# Patient Record
Sex: Female | Born: 1935
Health system: Southern US, Community
[De-identification: ages and names within clinical notes are randomized; demographics above are authoritative.]

## PROBLEM LIST (undated history)

## (undated) DIAGNOSIS — M1990 Unspecified osteoarthritis, unspecified site: Secondary | ICD-10-CM

## (undated) DIAGNOSIS — E039 Hypothyroidism, unspecified: Secondary | ICD-10-CM

## (undated) HISTORY — PX: COLONOSCOPY: SHX174

## (undated) HISTORY — PX: MENISCUS REPAIR: SHX5179

---

## 1997-05-15 ENCOUNTER — Other Ambulatory Visit: Admission: RE | Admit: 1997-05-15 | Discharge: 1997-05-15 | Payer: Self-pay | Admitting: Obstetrics and Gynecology

## 1998-04-23 ENCOUNTER — Other Ambulatory Visit: Admission: RE | Admit: 1998-04-23 | Discharge: 1998-04-23 | Payer: Self-pay | Admitting: Obstetrics and Gynecology

## 1998-08-15 ENCOUNTER — Encounter: Payer: Self-pay | Admitting: *Deleted

## 1998-08-16 ENCOUNTER — Ambulatory Visit (HOSPITAL_COMMUNITY): Admission: RE | Admit: 1998-08-16 | Discharge: 1998-08-16 | Payer: Self-pay | Admitting: *Deleted

## 1998-08-16 ENCOUNTER — Encounter: Payer: Self-pay | Admitting: *Deleted

## 1998-08-17 ENCOUNTER — Ambulatory Visit (HOSPITAL_COMMUNITY): Admission: RE | Admit: 1998-08-17 | Discharge: 1998-08-18 | Payer: Self-pay | Admitting: *Deleted

## 1999-06-06 ENCOUNTER — Other Ambulatory Visit: Admission: RE | Admit: 1999-06-06 | Discharge: 1999-06-06 | Payer: Self-pay | Admitting: Obstetrics and Gynecology

## 1999-06-13 ENCOUNTER — Encounter: Payer: Self-pay | Admitting: Obstetrics and Gynecology

## 1999-06-13 ENCOUNTER — Encounter: Admission: RE | Admit: 1999-06-13 | Discharge: 1999-06-13 | Payer: Self-pay | Admitting: Obstetrics and Gynecology

## 1999-08-01 ENCOUNTER — Encounter: Payer: Self-pay | Admitting: *Deleted

## 1999-08-01 ENCOUNTER — Encounter: Admission: RE | Admit: 1999-08-01 | Discharge: 1999-08-01 | Payer: Self-pay | Admitting: *Deleted

## 2000-06-15 ENCOUNTER — Encounter: Payer: Self-pay | Admitting: Internal Medicine

## 2000-06-15 ENCOUNTER — Encounter: Admission: RE | Admit: 2000-06-15 | Discharge: 2000-06-15 | Payer: Self-pay | Admitting: Internal Medicine

## 2000-06-17 ENCOUNTER — Other Ambulatory Visit: Admission: RE | Admit: 2000-06-17 | Discharge: 2000-06-17 | Payer: Self-pay | Admitting: Obstetrics and Gynecology

## 2002-06-21 ENCOUNTER — Encounter: Admission: RE | Admit: 2002-06-21 | Discharge: 2002-06-21 | Payer: Self-pay | Admitting: Internal Medicine

## 2002-06-21 ENCOUNTER — Encounter: Payer: Self-pay | Admitting: Internal Medicine

## 2003-06-22 ENCOUNTER — Encounter: Admission: RE | Admit: 2003-06-22 | Discharge: 2003-06-22 | Payer: Self-pay | Admitting: Internal Medicine

## 2004-07-03 ENCOUNTER — Encounter: Admission: RE | Admit: 2004-07-03 | Discharge: 2004-07-03 | Payer: Self-pay | Admitting: Internal Medicine

## 2004-11-11 ENCOUNTER — Ambulatory Visit (HOSPITAL_COMMUNITY): Admission: RE | Admit: 2004-11-11 | Discharge: 2004-11-11 | Payer: Self-pay | Admitting: Gastroenterology

## 2005-07-04 ENCOUNTER — Encounter: Admission: RE | Admit: 2005-07-04 | Discharge: 2005-07-04 | Payer: Self-pay | Admitting: Internal Medicine

## 2006-07-06 ENCOUNTER — Encounter: Admission: RE | Admit: 2006-07-06 | Discharge: 2006-07-06 | Payer: Self-pay | Admitting: Internal Medicine

## 2007-07-07 ENCOUNTER — Encounter: Admission: RE | Admit: 2007-07-07 | Discharge: 2007-07-07 | Payer: Self-pay | Admitting: Internal Medicine

## 2008-07-10 ENCOUNTER — Encounter: Admission: RE | Admit: 2008-07-10 | Discharge: 2008-07-10 | Payer: Self-pay | Admitting: Internal Medicine

## 2008-07-25 ENCOUNTER — Encounter: Admission: RE | Admit: 2008-07-25 | Discharge: 2008-07-25 | Payer: Self-pay | Admitting: Internal Medicine

## 2008-09-07 ENCOUNTER — Encounter: Admission: RE | Admit: 2008-09-07 | Discharge: 2008-09-07 | Payer: Self-pay | Admitting: Surgery

## 2008-09-11 ENCOUNTER — Encounter (INDEPENDENT_AMBULATORY_CARE_PROVIDER_SITE_OTHER): Payer: Self-pay | Admitting: Surgery

## 2008-09-11 ENCOUNTER — Ambulatory Visit (HOSPITAL_BASED_OUTPATIENT_CLINIC_OR_DEPARTMENT_OTHER): Admission: RE | Admit: 2008-09-11 | Discharge: 2008-09-11 | Payer: Self-pay | Admitting: Surgery

## 2008-09-11 ENCOUNTER — Encounter: Admission: RE | Admit: 2008-09-11 | Discharge: 2008-09-11 | Payer: Self-pay | Admitting: Surgery

## 2009-07-11 ENCOUNTER — Encounter: Admission: RE | Admit: 2009-07-11 | Discharge: 2009-07-11 | Payer: Self-pay | Admitting: Internal Medicine

## 2010-02-25 ENCOUNTER — Encounter: Payer: Self-pay | Admitting: Internal Medicine

## 2010-06-07 ENCOUNTER — Other Ambulatory Visit: Payer: Self-pay | Admitting: Internal Medicine

## 2010-06-07 DIAGNOSIS — Z1231 Encounter for screening mammogram for malignant neoplasm of breast: Secondary | ICD-10-CM

## 2010-06-18 NOTE — Op Note (Signed)
Toni Medina, MUCHA                ACCOUNT NO.:  [id number]   MEDICAL RECORD NO.:  [id number]          PATIENT TYPE:  AMB   LOCATION:  DSC                          FACILITY:  [facility]   PHYSICIAN:  Sandria Bales. Ezzard Standing, M.D.  DATE OF BIRTH:  March 20, 1931   DATE OF PROCEDURE:  DATE OF DISCHARGE:                               OPERATIVE REPORT   PREOPERATIVE DIAGNOSIS:  Atypical ductal hyperplasia of the left breast  at 1 o'clock position.   POSTOPERATIVE DIAGNOSIS:  Atypical ductal hyperplasia of the left breast  at 1 o'clock position.   FINAL PATHOLOGY:  Pending.   PROCEDURE:  Needle localization, left breast biopsy.   SURGEON:  Sandria Bales. Ezzard Standing, M.D.   FIRST ASSISTANT:  None.   ANESTHESIA:  General LMA with 20 mL of 0.25% Marcaine.   COMPLICATIONS:  None.   INDICATIONS FOR PROCEDURE:  Ms. Raybon is a 77 year old white female,  patient of Dr. Theressa Millard who had a recent mammogram, which revealed  microcalcifications in the upper quadrant of her left breast.  She  underwent a biopsy on July 25, 2008, which showed a focal area of  atypical ductal hyperplasia and now comes for wider biopsy of that area.   I discussed with Ms. Massimino the indication and potential complication of  the surgery.  Potential complications of wider biopsy include, but are  not limited to, bleeding, infection, nerve injury, and if this is  malignant she will possibly need more surgery.   OPERATIVE NOTE:  The patient was placed in a supine position, given a  general LMA anesthesia.  Her left breast was prepped with ChloraPrep and  sterilely draped.  A time-out was held to identify the patient and  procedure.   The patient had a wire coming from her left breast at about 1 o'clock  position.  The wire was actually probably 6 or 7 cm from the areola, by  my markings the calcification is probably 2 cm from the areola.  So I  tried to make the incision directly over where I perceived the  calcifications to be.  I  made an incision about 3.5 cm I cut down and  took out a block of tissue probably about the size of a golf ball, may  be 3 cm in diameter.  I then did a specimen mammogram, which confirmed  the wire and a clip on the middle of the specimen.  I did go down to the  chest wall and excised this specimen.   I then closed in layers with a 3-0 Vicryl suture in the subcutaneous  tissues and 5-0 Vicryl suture in the skin and painted with Dermabond.  I  did infiltrate 20 mL of 0.25% Marcaine with local anesthetic.  The  patient tolerated the procedure well and transported to recovery room.   PLAN:  To discharge her home today and return to see me in 7-14 days.      Sandria Bales. Ezzard Standing, M.D.  Electronically Signed     Sandria Bales. Ezzard Standing, M.D.  Electronically Signed    DHN/MEDQ  D:  09/11/2008  T:  09/12/2008  Job:  [id number]   cc:   Theressa Millard, M.D.

## 2010-06-21 NOTE — Op Note (Signed)
NAMEAYDE, RECORD                ACCOUNT NO.:  [id number]   MEDICAL RECORD NO.:  [id number]          PATIENT TYPE:  AMB   LOCATION:  ENDO                         FACILITY:  [facility]   PHYSICIAN:  Bernette Redbird, M.D.   DATE OF BIRTH:  Sep 11, 1931   DATE OF PROCEDURE:  11/11/2004  DATE OF DISCHARGE:                                 OPERATIVE REPORT   PROCEDURE:  Colonoscopy.   INDICATIONS:  A 77 year old female with need for colon cancer screening,  initial exam, no previous colonoscopy, no worrisome risk factors or  symptoms.   FINDINGS:  Severe sigmoid fixation and mild diverticulosis.   DESCRIPTION OF PROCEDURE:  The nature, purpose and risks of the procedure  had been reviewed with the patient who provided written consent. Sedation  was fentanyl 60 mcg and Versed 8 mg IV prior to and during the course of the  exam without arrhythmias or desaturation. The Olympus adjustable tension  pediatric video colonoscope was advanced with severe difficulty through a  very fixated and angulated sigmoid region. With the patient in the supine  position and then back in the left lateral decubitus position, I was  eventually able to get the scope to release and thereafter advancement was  fairly easy around the colon to the cecum which was identified by  visualization of the appendiceal orifice, and then into a normal-appearing  terminal ileum, whereupon pullback was performed. The quality of the prep  was excellent and it was felt that all areas were well seen.   Apart from mild to moderate sigmoid diverticulosis and perhaps some mild  scattered diverticulosis elsewhere, this was a normal examination. No  polyps, cancer, colitis or vascular malformations were seen. Retroflexion in  the rectum was normal. No biopsies were obtained. The patient tolerated the  procedure quite well and there were no apparent complications.   IMPRESSION:  1.  Screening colonoscopy without worrisome findings in a  standard risk      individual (V76.51).  2.  Severe sigmoid fixation with associated diverticulosis.   PLAN:  Consider virtuosity colonoscopy for repeat screening in 10 years.           ______________________________  Bernette Redbird, M.D.     RB/MEDQ  D:  11/11/2004  T:  11/11/2004  Job:  [id number]   cc:   Theressa Millard, M.D.  Fax: [phone]

## 2010-07-15 ENCOUNTER — Ambulatory Visit
Admission: RE | Admit: 2010-07-15 | Discharge: 2010-07-15 | Disposition: A | Discharge status: Home / Self Care | Payer: Medicare Other | Source: Ambulatory Visit | Attending: Internal Medicine | Admitting: Internal Medicine

## 2010-07-15 DIAGNOSIS — Z1231 Encounter for screening mammogram for malignant neoplasm of breast: Secondary | ICD-10-CM

## 2011-06-18 ENCOUNTER — Other Ambulatory Visit: Payer: Self-pay | Admitting: Internal Medicine

## 2011-06-18 DIAGNOSIS — Z1231 Encounter for screening mammogram for malignant neoplasm of breast: Secondary | ICD-10-CM

## 2011-07-16 ENCOUNTER — Ambulatory Visit
Admission: RE | Admit: 2011-07-16 | Discharge: 2011-07-16 | Disposition: A | Discharge status: Home / Self Care | Payer: Medicare Other | Source: Ambulatory Visit | Attending: Internal Medicine | Admitting: Internal Medicine

## 2011-07-16 DIAGNOSIS — Z1231 Encounter for screening mammogram for malignant neoplasm of breast: Secondary | ICD-10-CM

## 2011-09-11 DIAGNOSIS — E039 Hypothyroidism, unspecified: Secondary | ICD-10-CM | POA: Diagnosis not present

## 2011-09-11 DIAGNOSIS — E7800 Pure hypercholesterolemia, unspecified: Secondary | ICD-10-CM | POA: Diagnosis not present

## 2011-09-26 DIAGNOSIS — IMO0002 Reserved for concepts with insufficient information to code with codable children: Secondary | ICD-10-CM | POA: Diagnosis not present

## 2011-09-26 DIAGNOSIS — M1710 Unilateral primary osteoarthritis, unspecified knee: Secondary | ICD-10-CM | POA: Diagnosis not present

## 2011-10-27 DIAGNOSIS — N393 Stress incontinence (female) (male): Secondary | ICD-10-CM | POA: Diagnosis not present

## 2011-10-27 DIAGNOSIS — E7800 Pure hypercholesterolemia, unspecified: Secondary | ICD-10-CM | POA: Diagnosis not present

## 2011-10-27 DIAGNOSIS — Z1331 Encounter for screening for depression: Secondary | ICD-10-CM | POA: Diagnosis not present

## 2011-10-27 DIAGNOSIS — E039 Hypothyroidism, unspecified: Secondary | ICD-10-CM | POA: Diagnosis not present

## 2011-10-27 DIAGNOSIS — Z0000 Encounter for general adult medical examination without abnormal findings: Secondary | ICD-10-CM | POA: Diagnosis not present

## 2011-11-26 DIAGNOSIS — R3915 Urgency of urination: Secondary | ICD-10-CM | POA: Diagnosis not present

## 2011-11-26 DIAGNOSIS — N3941 Urge incontinence: Secondary | ICD-10-CM | POA: Diagnosis not present

## 2011-11-26 DIAGNOSIS — R350 Frequency of micturition: Secondary | ICD-10-CM | POA: Diagnosis not present

## 2011-11-27 DIAGNOSIS — H04129 Dry eye syndrome of unspecified lacrimal gland: Secondary | ICD-10-CM | POA: Diagnosis not present

## 2011-11-27 DIAGNOSIS — H43819 Vitreous degeneration, unspecified eye: Secondary | ICD-10-CM | POA: Diagnosis not present

## 2011-11-27 DIAGNOSIS — Z961 Presence of intraocular lens: Secondary | ICD-10-CM | POA: Diagnosis not present

## 2011-11-27 DIAGNOSIS — H52209 Unspecified astigmatism, unspecified eye: Secondary | ICD-10-CM | POA: Diagnosis not present

## 2011-12-27 DIAGNOSIS — Z23 Encounter for immunization: Secondary | ICD-10-CM | POA: Diagnosis not present

## 2012-02-04 HISTORY — PX: BREAST EXCISIONAL BIOPSY: SUR124

## 2012-06-07 ENCOUNTER — Other Ambulatory Visit: Payer: Self-pay

## 2012-06-07 DIAGNOSIS — Z1231 Encounter for screening mammogram for malignant neoplasm of breast: Secondary | ICD-10-CM

## 2012-06-17 DIAGNOSIS — J069 Acute upper respiratory infection, unspecified: Secondary | ICD-10-CM | POA: Diagnosis not present

## 2012-07-16 ENCOUNTER — Ambulatory Visit: Payer: Medicare Other

## 2012-07-19 ENCOUNTER — Ambulatory Visit
Admission: RE | Admit: 2012-07-19 | Discharge: 2012-07-19 | Disposition: A | Discharge status: Home / Self Care | Payer: Medicare Other | Source: Ambulatory Visit

## 2012-07-19 DIAGNOSIS — Z1231 Encounter for screening mammogram for malignant neoplasm of breast: Secondary | ICD-10-CM

## 2012-10-26 DIAGNOSIS — E7800 Pure hypercholesterolemia, unspecified: Secondary | ICD-10-CM | POA: Diagnosis not present

## 2012-10-26 DIAGNOSIS — Z0000 Encounter for general adult medical examination without abnormal findings: Secondary | ICD-10-CM | POA: Diagnosis not present

## 2012-10-26 DIAGNOSIS — E039 Hypothyroidism, unspecified: Secondary | ICD-10-CM | POA: Diagnosis not present

## 2012-10-26 DIAGNOSIS — L989 Disorder of the skin and subcutaneous tissue, unspecified: Secondary | ICD-10-CM | POA: Diagnosis not present

## 2012-10-26 DIAGNOSIS — Z1331 Encounter for screening for depression: Secondary | ICD-10-CM | POA: Diagnosis not present

## 2012-11-23 DIAGNOSIS — L821 Other seborrheic keratosis: Secondary | ICD-10-CM | POA: Diagnosis not present

## 2012-11-23 DIAGNOSIS — L84 Corns and callosities: Secondary | ICD-10-CM | POA: Diagnosis not present

## 2012-11-24 DIAGNOSIS — Z23 Encounter for immunization: Secondary | ICD-10-CM | POA: Diagnosis not present

## 2012-12-22 DIAGNOSIS — Z961 Presence of intraocular lens: Secondary | ICD-10-CM | POA: Diagnosis not present

## 2012-12-22 DIAGNOSIS — H43819 Vitreous degeneration, unspecified eye: Secondary | ICD-10-CM | POA: Diagnosis not present

## 2012-12-22 DIAGNOSIS — H52209 Unspecified astigmatism, unspecified eye: Secondary | ICD-10-CM | POA: Diagnosis not present

## 2013-07-07 ENCOUNTER — Other Ambulatory Visit: Payer: Self-pay

## 2013-07-07 DIAGNOSIS — Z1231 Encounter for screening mammogram for malignant neoplasm of breast: Secondary | ICD-10-CM

## 2013-07-20 ENCOUNTER — Ambulatory Visit
Admission: RE | Admit: 2013-07-20 | Discharge: 2013-07-20 | Disposition: A | Discharge status: Home / Self Care | Payer: Medicare Other | Source: Ambulatory Visit

## 2013-07-20 DIAGNOSIS — Z1231 Encounter for screening mammogram for malignant neoplasm of breast: Secondary | ICD-10-CM

## 2013-11-07 DIAGNOSIS — E039 Hypothyroidism, unspecified: Secondary | ICD-10-CM | POA: Diagnosis not present

## 2013-11-07 DIAGNOSIS — Z0000 Encounter for general adult medical examination without abnormal findings: Secondary | ICD-10-CM | POA: Diagnosis not present

## 2013-11-07 DIAGNOSIS — E780 Pure hypercholesterolemia: Secondary | ICD-10-CM | POA: Diagnosis not present

## 2013-11-07 DIAGNOSIS — R200 Anesthesia of skin: Secondary | ICD-10-CM | POA: Diagnosis not present

## 2013-11-16 DIAGNOSIS — Z23 Encounter for immunization: Secondary | ICD-10-CM | POA: Diagnosis not present

## 2013-12-15 DIAGNOSIS — Z961 Presence of intraocular lens: Secondary | ICD-10-CM | POA: Diagnosis not present

## 2013-12-15 DIAGNOSIS — H52203 Unspecified astigmatism, bilateral: Secondary | ICD-10-CM | POA: Diagnosis not present

## 2014-06-22 ENCOUNTER — Other Ambulatory Visit: Payer: Self-pay

## 2014-06-22 DIAGNOSIS — Z1231 Encounter for screening mammogram for malignant neoplasm of breast: Secondary | ICD-10-CM

## 2014-06-30 ENCOUNTER — Other Ambulatory Visit: Payer: Self-pay | Admitting: Internal Medicine

## 2014-06-30 DIAGNOSIS — E2839 Other primary ovarian failure: Secondary | ICD-10-CM

## 2014-07-24 ENCOUNTER — Ambulatory Visit
Admission: RE | Admit: 2014-07-24 | Discharge: 2014-07-24 | Disposition: A | Discharge status: Home / Self Care | Payer: Medicare Other | Source: Ambulatory Visit

## 2014-07-24 ENCOUNTER — Ambulatory Visit
Admission: RE | Admit: 2014-07-24 | Discharge: 2014-07-24 | Disposition: A | Discharge status: Home / Self Care | Payer: Medicare Other | Source: Ambulatory Visit | Attending: Internal Medicine | Admitting: Internal Medicine

## 2014-07-24 DIAGNOSIS — Z1231 Encounter for screening mammogram for malignant neoplasm of breast: Secondary | ICD-10-CM | POA: Diagnosis not present

## 2014-07-24 DIAGNOSIS — Z13820 Encounter for screening for osteoporosis: Secondary | ICD-10-CM | POA: Diagnosis not present

## 2014-07-24 DIAGNOSIS — E2839 Other primary ovarian failure: Secondary | ICD-10-CM

## 2014-07-24 DIAGNOSIS — Z780 Asymptomatic menopausal state: Secondary | ICD-10-CM | POA: Diagnosis not present

## 2014-09-06 DIAGNOSIS — H01004 Unspecified blepharitis left upper eyelid: Secondary | ICD-10-CM | POA: Diagnosis not present

## 2014-09-06 DIAGNOSIS — H1032 Unspecified acute conjunctivitis, left eye: Secondary | ICD-10-CM | POA: Diagnosis not present

## 2014-09-06 DIAGNOSIS — H01005 Unspecified blepharitis left lower eyelid: Secondary | ICD-10-CM | POA: Diagnosis not present

## 2014-11-09 DIAGNOSIS — Z0000 Encounter for general adult medical examination without abnormal findings: Secondary | ICD-10-CM | POA: Diagnosis not present

## 2014-11-09 DIAGNOSIS — E7800 Pure hypercholesterolemia, unspecified: Secondary | ICD-10-CM | POA: Diagnosis not present

## 2014-11-09 DIAGNOSIS — M545 Low back pain: Secondary | ICD-10-CM | POA: Diagnosis not present

## 2014-11-09 DIAGNOSIS — E039 Hypothyroidism, unspecified: Secondary | ICD-10-CM | POA: Diagnosis not present

## 2014-11-09 DIAGNOSIS — L659 Nonscarring hair loss, unspecified: Secondary | ICD-10-CM | POA: Diagnosis not present

## 2014-12-18 DIAGNOSIS — H43813 Vitreous degeneration, bilateral: Secondary | ICD-10-CM | POA: Diagnosis not present

## 2014-12-18 DIAGNOSIS — Z961 Presence of intraocular lens: Secondary | ICD-10-CM | POA: Diagnosis not present

## 2014-12-18 DIAGNOSIS — H52203 Unspecified astigmatism, bilateral: Secondary | ICD-10-CM | POA: Diagnosis not present

## 2014-12-20 DIAGNOSIS — Z23 Encounter for immunization: Secondary | ICD-10-CM | POA: Diagnosis not present

## 2015-02-23 DIAGNOSIS — L718 Other rosacea: Secondary | ICD-10-CM | POA: Diagnosis not present

## 2015-02-23 DIAGNOSIS — L0109 Other impetigo: Secondary | ICD-10-CM | POA: Diagnosis not present

## 2015-05-15 DIAGNOSIS — Z23 Encounter for immunization: Secondary | ICD-10-CM | POA: Diagnosis not present

## 2015-06-26 ENCOUNTER — Other Ambulatory Visit: Payer: Self-pay | Admitting: Gastroenterology

## 2015-06-26 DIAGNOSIS — D120 Benign neoplasm of cecum: Secondary | ICD-10-CM | POA: Diagnosis not present

## 2015-06-26 DIAGNOSIS — D122 Benign neoplasm of ascending colon: Secondary | ICD-10-CM | POA: Diagnosis not present

## 2015-06-26 DIAGNOSIS — Z1211 Encounter for screening for malignant neoplasm of colon: Secondary | ICD-10-CM | POA: Diagnosis not present

## 2015-06-26 DIAGNOSIS — D126 Benign neoplasm of colon, unspecified: Secondary | ICD-10-CM | POA: Diagnosis not present

## 2015-07-04 ENCOUNTER — Other Ambulatory Visit: Payer: Self-pay | Admitting: Internal Medicine

## 2015-07-04 DIAGNOSIS — Z1231 Encounter for screening mammogram for malignant neoplasm of breast: Secondary | ICD-10-CM

## 2015-07-25 ENCOUNTER — Ambulatory Visit
Admission: RE | Admit: 2015-07-25 | Discharge: 2015-07-25 | Disposition: A | Discharge status: Home / Self Care | Payer: Medicare Other | Source: Ambulatory Visit | Attending: Internal Medicine | Admitting: Internal Medicine

## 2015-07-25 DIAGNOSIS — Z1231 Encounter for screening mammogram for malignant neoplasm of breast: Secondary | ICD-10-CM

## 2015-10-09 ENCOUNTER — Other Ambulatory Visit: Payer: Self-pay | Admitting: Internal Medicine

## 2015-10-09 ENCOUNTER — Ambulatory Visit
Admission: RE | Admit: 2015-10-09 | Discharge: 2015-10-09 | Disposition: A | Discharge status: Home / Self Care | Payer: Medicare Other | Source: Ambulatory Visit | Attending: Internal Medicine | Admitting: Internal Medicine

## 2015-10-09 DIAGNOSIS — M545 Low back pain: Secondary | ICD-10-CM

## 2015-10-09 DIAGNOSIS — M5136 Other intervertebral disc degeneration, lumbar region: Secondary | ICD-10-CM | POA: Diagnosis not present

## 2015-11-13 DIAGNOSIS — Z23 Encounter for immunization: Secondary | ICD-10-CM | POA: Diagnosis not present

## 2015-11-22 ENCOUNTER — Other Ambulatory Visit: Payer: Self-pay | Admitting: Internal Medicine

## 2015-11-22 DIAGNOSIS — E7800 Pure hypercholesterolemia, unspecified: Secondary | ICD-10-CM | POA: Diagnosis not present

## 2015-11-22 DIAGNOSIS — M545 Low back pain, unspecified: Secondary | ICD-10-CM

## 2015-11-22 DIAGNOSIS — E039 Hypothyroidism, unspecified: Secondary | ICD-10-CM | POA: Diagnosis not present

## 2015-11-22 DIAGNOSIS — M5126 Other intervertebral disc displacement, lumbar region: Secondary | ICD-10-CM | POA: Diagnosis not present

## 2015-11-22 DIAGNOSIS — M5127 Other intervertebral disc displacement, lumbosacral region: Secondary | ICD-10-CM | POA: Diagnosis not present

## 2015-11-22 DIAGNOSIS — M47816 Spondylosis without myelopathy or radiculopathy, lumbar region: Secondary | ICD-10-CM | POA: Diagnosis not present

## 2015-11-22 DIAGNOSIS — Z1389 Encounter for screening for other disorder: Secondary | ICD-10-CM | POA: Diagnosis not present

## 2015-11-22 DIAGNOSIS — G548 Other nerve root and plexus disorders: Secondary | ICD-10-CM | POA: Diagnosis not present

## 2015-11-22 DIAGNOSIS — M47817 Spondylosis without myelopathy or radiculopathy, lumbosacral region: Secondary | ICD-10-CM | POA: Diagnosis not present

## 2015-11-22 DIAGNOSIS — Z0000 Encounter for general adult medical examination without abnormal findings: Secondary | ICD-10-CM | POA: Diagnosis not present

## 2015-12-03 ENCOUNTER — Other Ambulatory Visit: Payer: Medicare Other

## 2015-12-10 DIAGNOSIS — M545 Low back pain: Secondary | ICD-10-CM | POA: Diagnosis not present

## 2015-12-11 ENCOUNTER — Other Ambulatory Visit: Payer: Medicare Other

## 2015-12-19 DIAGNOSIS — Z961 Presence of intraocular lens: Secondary | ICD-10-CM | POA: Diagnosis not present

## 2015-12-19 DIAGNOSIS — H43813 Vitreous degeneration, bilateral: Secondary | ICD-10-CM | POA: Diagnosis not present

## 2015-12-19 DIAGNOSIS — H52203 Unspecified astigmatism, bilateral: Secondary | ICD-10-CM | POA: Diagnosis not present

## 2015-12-31 DIAGNOSIS — M5136 Other intervertebral disc degeneration, lumbar region: Secondary | ICD-10-CM | POA: Diagnosis not present

## 2016-05-14 DIAGNOSIS — G8929 Other chronic pain: Secondary | ICD-10-CM | POA: Diagnosis not present

## 2016-05-14 DIAGNOSIS — M545 Low back pain: Secondary | ICD-10-CM | POA: Diagnosis not present

## 2016-05-14 DIAGNOSIS — R030 Elevated blood-pressure reading, without diagnosis of hypertension: Secondary | ICD-10-CM | POA: Diagnosis not present

## 2016-06-19 DIAGNOSIS — M5136 Other intervertebral disc degeneration, lumbar region: Secondary | ICD-10-CM | POA: Diagnosis not present

## 2016-06-24 DIAGNOSIS — M5136 Other intervertebral disc degeneration, lumbar region: Secondary | ICD-10-CM | POA: Diagnosis not present

## 2016-06-26 DIAGNOSIS — M5136 Other intervertebral disc degeneration, lumbar region: Secondary | ICD-10-CM | POA: Diagnosis not present

## 2016-07-01 DIAGNOSIS — M5136 Other intervertebral disc degeneration, lumbar region: Secondary | ICD-10-CM | POA: Diagnosis not present

## 2016-07-03 DIAGNOSIS — M5136 Other intervertebral disc degeneration, lumbar region: Secondary | ICD-10-CM | POA: Diagnosis not present

## 2016-07-07 ENCOUNTER — Other Ambulatory Visit: Payer: Self-pay | Admitting: Internal Medicine

## 2016-07-07 DIAGNOSIS — Z1231 Encounter for screening mammogram for malignant neoplasm of breast: Secondary | ICD-10-CM

## 2016-07-08 DIAGNOSIS — M5136 Other intervertebral disc degeneration, lumbar region: Secondary | ICD-10-CM | POA: Diagnosis not present

## 2016-07-11 DIAGNOSIS — M5136 Other intervertebral disc degeneration, lumbar region: Secondary | ICD-10-CM | POA: Diagnosis not present

## 2016-07-14 DIAGNOSIS — M5136 Other intervertebral disc degeneration, lumbar region: Secondary | ICD-10-CM | POA: Diagnosis not present

## 2016-07-18 DIAGNOSIS — M5136 Other intervertebral disc degeneration, lumbar region: Secondary | ICD-10-CM | POA: Diagnosis not present

## 2016-07-21 DIAGNOSIS — M5136 Other intervertebral disc degeneration, lumbar region: Secondary | ICD-10-CM | POA: Diagnosis not present

## 2016-07-25 ENCOUNTER — Ambulatory Visit
Admission: RE | Admit: 2016-07-25 | Discharge: 2016-07-25 | Disposition: A | Discharge status: Home / Self Care | Payer: Medicare Other | Source: Ambulatory Visit | Attending: Internal Medicine | Admitting: Internal Medicine

## 2016-07-25 DIAGNOSIS — Z1231 Encounter for screening mammogram for malignant neoplasm of breast: Secondary | ICD-10-CM

## 2016-07-29 DIAGNOSIS — M5136 Other intervertebral disc degeneration, lumbar region: Secondary | ICD-10-CM | POA: Diagnosis not present

## 2016-08-01 DIAGNOSIS — M5136 Other intervertebral disc degeneration, lumbar region: Secondary | ICD-10-CM | POA: Diagnosis not present

## 2016-08-05 DIAGNOSIS — M5136 Other intervertebral disc degeneration, lumbar region: Secondary | ICD-10-CM | POA: Diagnosis not present

## 2016-08-12 DIAGNOSIS — M5136 Other intervertebral disc degeneration, lumbar region: Secondary | ICD-10-CM | POA: Diagnosis not present

## 2016-08-19 DIAGNOSIS — M5136 Other intervertebral disc degeneration, lumbar region: Secondary | ICD-10-CM | POA: Diagnosis not present

## 2016-10-23 DIAGNOSIS — R350 Frequency of micturition: Secondary | ICD-10-CM | POA: Diagnosis not present

## 2016-11-24 DIAGNOSIS — Z1389 Encounter for screening for other disorder: Secondary | ICD-10-CM | POA: Diagnosis not present

## 2016-11-24 DIAGNOSIS — M545 Low back pain: Secondary | ICD-10-CM | POA: Diagnosis not present

## 2016-11-24 DIAGNOSIS — L659 Nonscarring hair loss, unspecified: Secondary | ICD-10-CM | POA: Diagnosis not present

## 2016-11-24 DIAGNOSIS — E7800 Pure hypercholesterolemia, unspecified: Secondary | ICD-10-CM | POA: Diagnosis not present

## 2016-11-24 DIAGNOSIS — M25511 Pain in right shoulder: Secondary | ICD-10-CM | POA: Diagnosis not present

## 2016-11-24 DIAGNOSIS — E039 Hypothyroidism, unspecified: Secondary | ICD-10-CM | POA: Diagnosis not present

## 2016-11-24 DIAGNOSIS — Z0000 Encounter for general adult medical examination without abnormal findings: Secondary | ICD-10-CM | POA: Diagnosis not present

## 2016-12-09 DIAGNOSIS — Z23 Encounter for immunization: Secondary | ICD-10-CM | POA: Diagnosis not present

## 2016-12-10 DIAGNOSIS — M5136 Other intervertebral disc degeneration, lumbar region: Secondary | ICD-10-CM | POA: Diagnosis not present

## 2016-12-16 DIAGNOSIS — M5136 Other intervertebral disc degeneration, lumbar region: Secondary | ICD-10-CM | POA: Diagnosis not present

## 2016-12-18 DIAGNOSIS — Z961 Presence of intraocular lens: Secondary | ICD-10-CM | POA: Diagnosis not present

## 2016-12-18 DIAGNOSIS — H52203 Unspecified astigmatism, bilateral: Secondary | ICD-10-CM | POA: Diagnosis not present

## 2016-12-18 DIAGNOSIS — H43813 Vitreous degeneration, bilateral: Secondary | ICD-10-CM | POA: Diagnosis not present

## 2016-12-19 DIAGNOSIS — M5136 Other intervertebral disc degeneration, lumbar region: Secondary | ICD-10-CM | POA: Diagnosis not present

## 2016-12-22 DIAGNOSIS — R350 Frequency of micturition: Secondary | ICD-10-CM | POA: Diagnosis not present

## 2016-12-22 DIAGNOSIS — M5136 Other intervertebral disc degeneration, lumbar region: Secondary | ICD-10-CM | POA: Diagnosis not present

## 2017-01-02 DIAGNOSIS — M5136 Other intervertebral disc degeneration, lumbar region: Secondary | ICD-10-CM | POA: Diagnosis not present

## 2017-01-06 DIAGNOSIS — M5136 Other intervertebral disc degeneration, lumbar region: Secondary | ICD-10-CM | POA: Diagnosis not present

## 2017-01-09 DIAGNOSIS — M5136 Other intervertebral disc degeneration, lumbar region: Secondary | ICD-10-CM | POA: Diagnosis not present

## 2017-03-20 ENCOUNTER — Other Ambulatory Visit: Payer: Self-pay | Admitting: Internal Medicine

## 2017-03-20 DIAGNOSIS — R030 Elevated blood-pressure reading, without diagnosis of hypertension: Secondary | ICD-10-CM | POA: Diagnosis not present

## 2017-03-20 DIAGNOSIS — Z0000 Encounter for general adult medical examination without abnormal findings: Secondary | ICD-10-CM | POA: Diagnosis not present

## 2017-03-20 DIAGNOSIS — E7800 Pure hypercholesterolemia, unspecified: Secondary | ICD-10-CM | POA: Diagnosis not present

## 2017-03-20 DIAGNOSIS — M545 Low back pain: Secondary | ICD-10-CM | POA: Diagnosis not present

## 2017-03-20 DIAGNOSIS — E2839 Other primary ovarian failure: Secondary | ICD-10-CM | POA: Diagnosis not present

## 2017-03-20 DIAGNOSIS — R42 Dizziness and giddiness: Secondary | ICD-10-CM | POA: Diagnosis not present

## 2017-03-20 DIAGNOSIS — E039 Hypothyroidism, unspecified: Secondary | ICD-10-CM | POA: Diagnosis not present

## 2017-03-20 DIAGNOSIS — Z23 Encounter for immunization: Secondary | ICD-10-CM | POA: Diagnosis not present

## 2017-04-01 ENCOUNTER — Ambulatory Visit
Admission: RE | Admit: 2017-04-01 | Discharge: 2017-04-01 | Disposition: A | Discharge status: Home / Self Care | Payer: Medicare Other | Source: Ambulatory Visit | Attending: Internal Medicine | Admitting: Internal Medicine

## 2017-04-01 DIAGNOSIS — Z780 Asymptomatic menopausal state: Secondary | ICD-10-CM | POA: Diagnosis not present

## 2017-04-01 DIAGNOSIS — M85851 Other specified disorders of bone density and structure, right thigh: Secondary | ICD-10-CM | POA: Diagnosis not present

## 2017-04-01 DIAGNOSIS — E2839 Other primary ovarian failure: Secondary | ICD-10-CM

## 2017-07-31 ENCOUNTER — Other Ambulatory Visit: Payer: Self-pay | Admitting: Internal Medicine

## 2017-07-31 DIAGNOSIS — Z1231 Encounter for screening mammogram for malignant neoplasm of breast: Secondary | ICD-10-CM

## 2017-08-20 ENCOUNTER — Inpatient Hospital Stay: Admission: RE | Admit: 2017-08-20 | Payer: Medicare Other | Source: Ambulatory Visit

## 2017-08-26 ENCOUNTER — Ambulatory Visit
Admission: RE | Admit: 2017-08-26 | Discharge: 2017-08-26 | Disposition: A | Discharge status: Home / Self Care | Payer: Medicare Other | Source: Ambulatory Visit | Attending: Internal Medicine | Admitting: Internal Medicine

## 2017-08-26 DIAGNOSIS — Z1231 Encounter for screening mammogram for malignant neoplasm of breast: Secondary | ICD-10-CM | POA: Diagnosis not present

## 2017-09-06 IMAGING — MG 2D DIGITAL SCREENING BILATERAL MAMMOGRAM WITH CAD AND ADJUNCT TO
9 of 12 series · 9 of 28 positions shown · non-contrast
Comparison: Previous exam(s).

CLINICAL DATA: Screening.

EXAM:
2D DIGITAL SCREENING BILATERAL MAMMOGRAM WITH CAD AND ADJUNCT TOMO

[R MLO synth-2D]
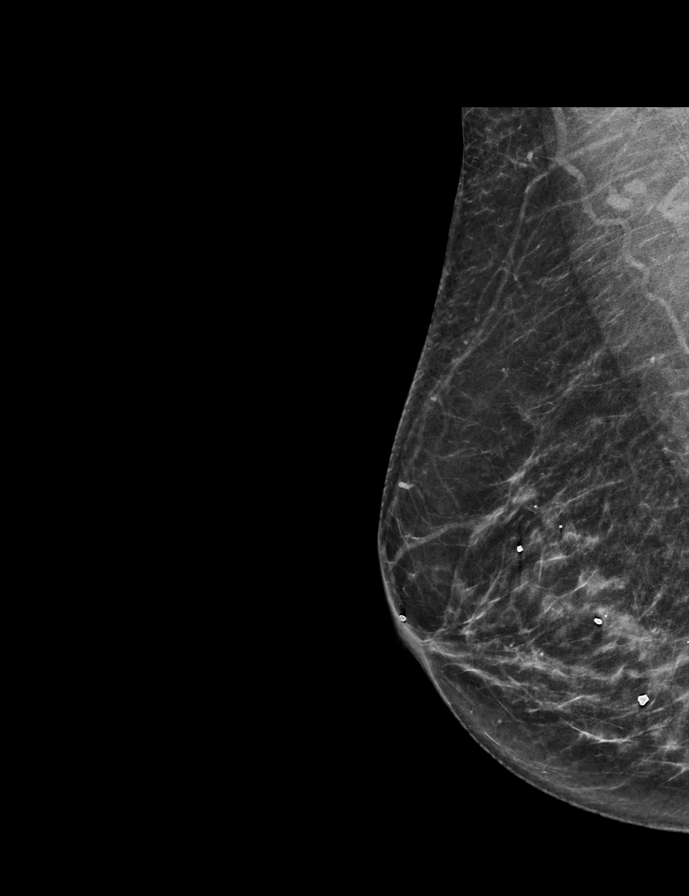

[L MLO]
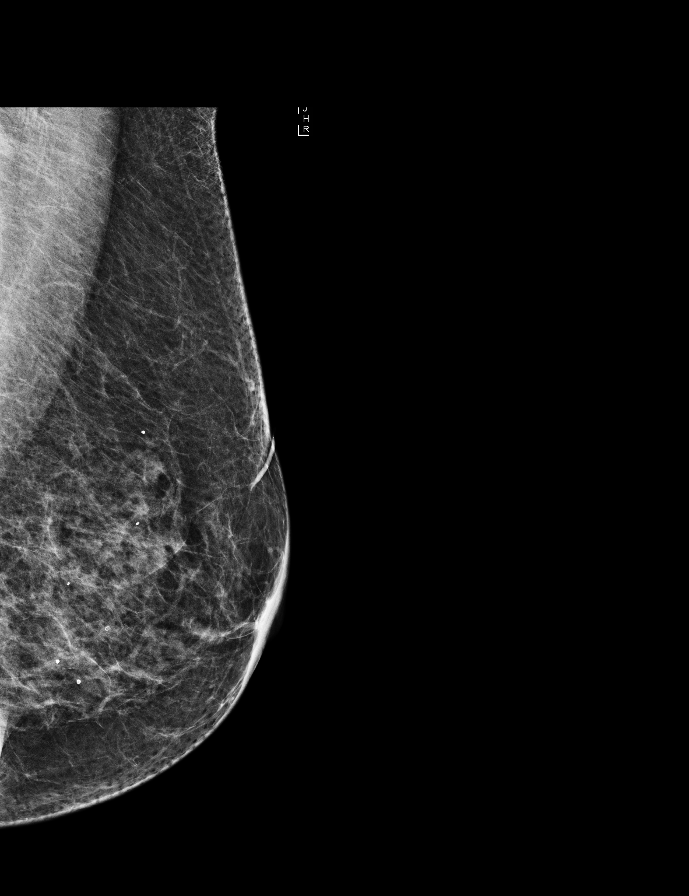

[L CC synth-2D]
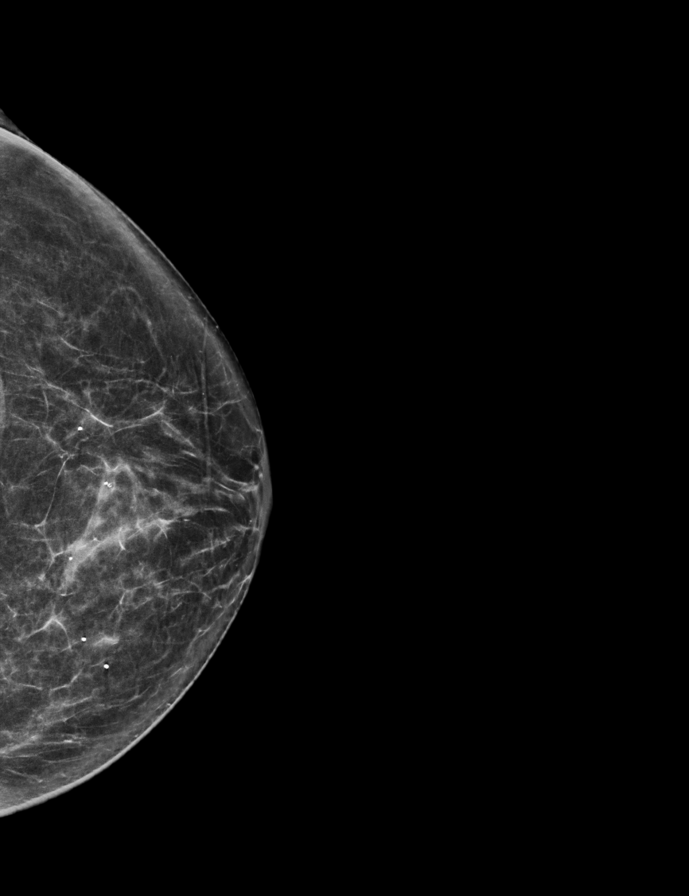

[L CC]
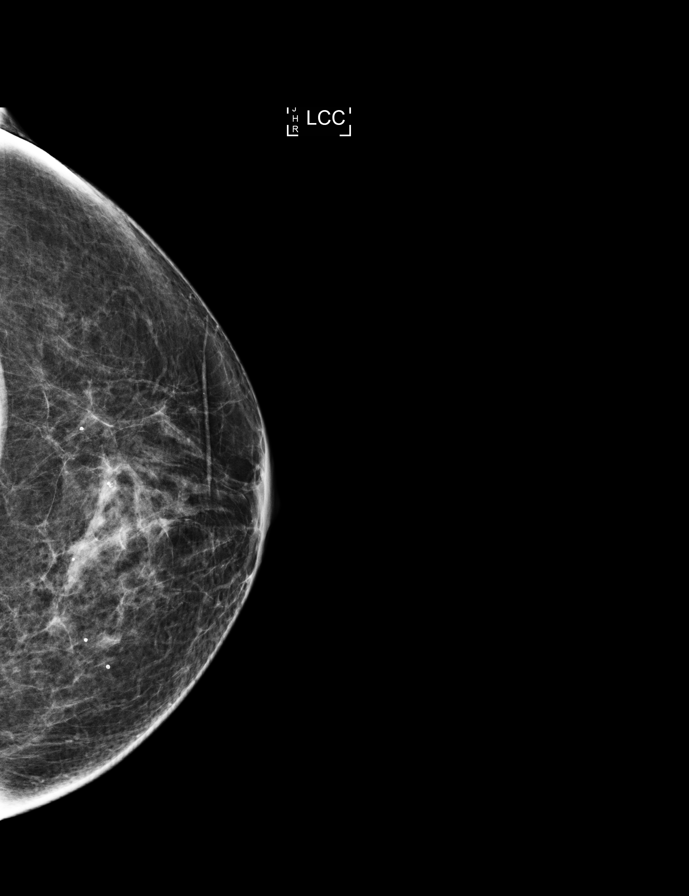

[R CC synth-2D]
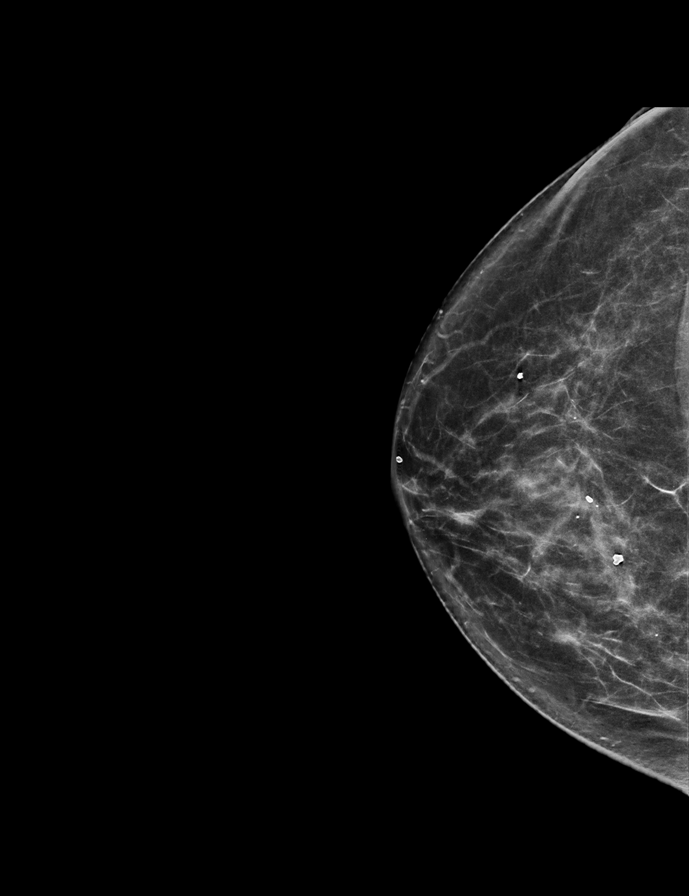

[R CC]
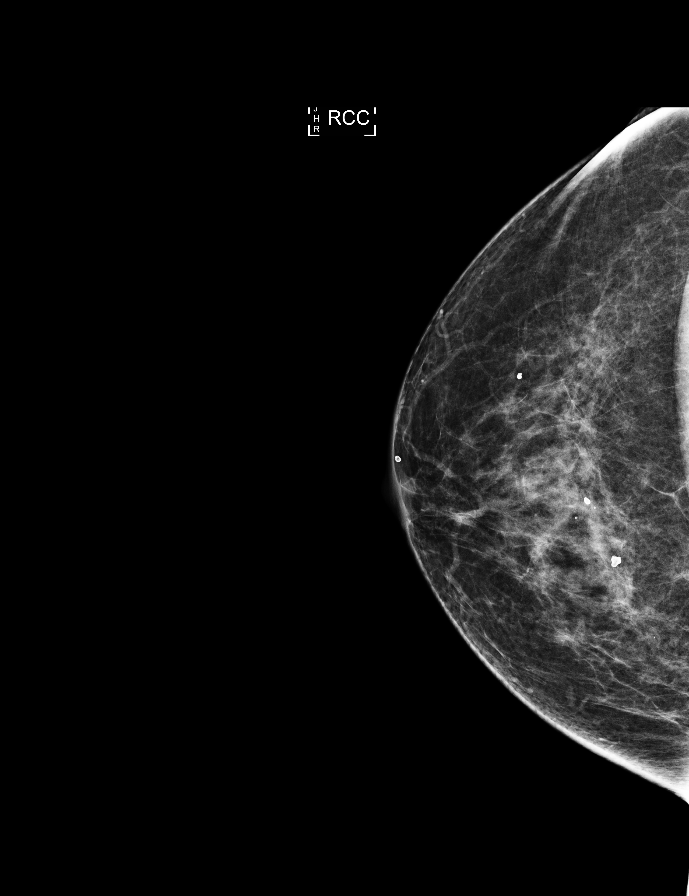

[L MLO synth-2D]
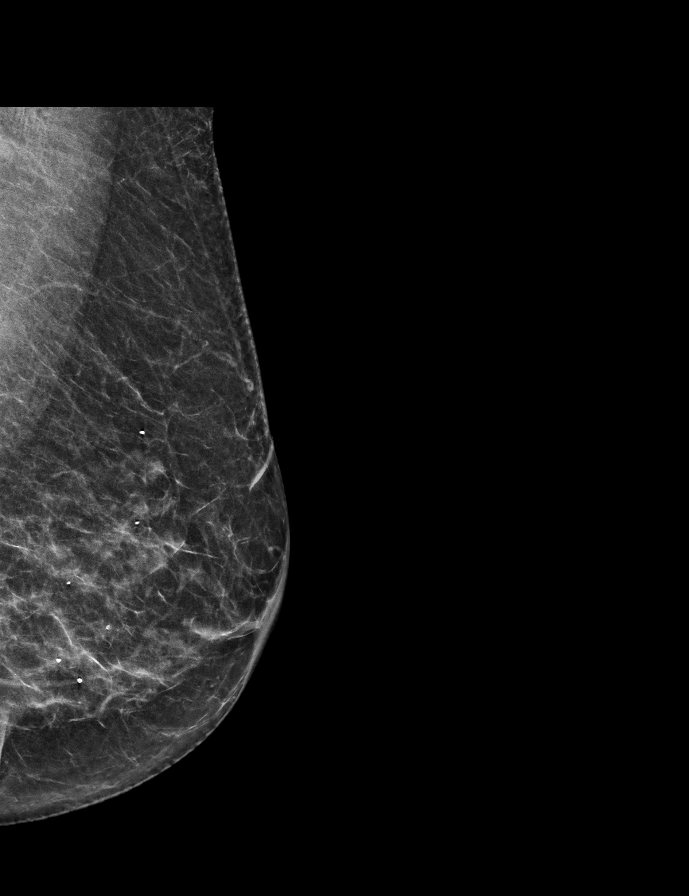

[R MLO]
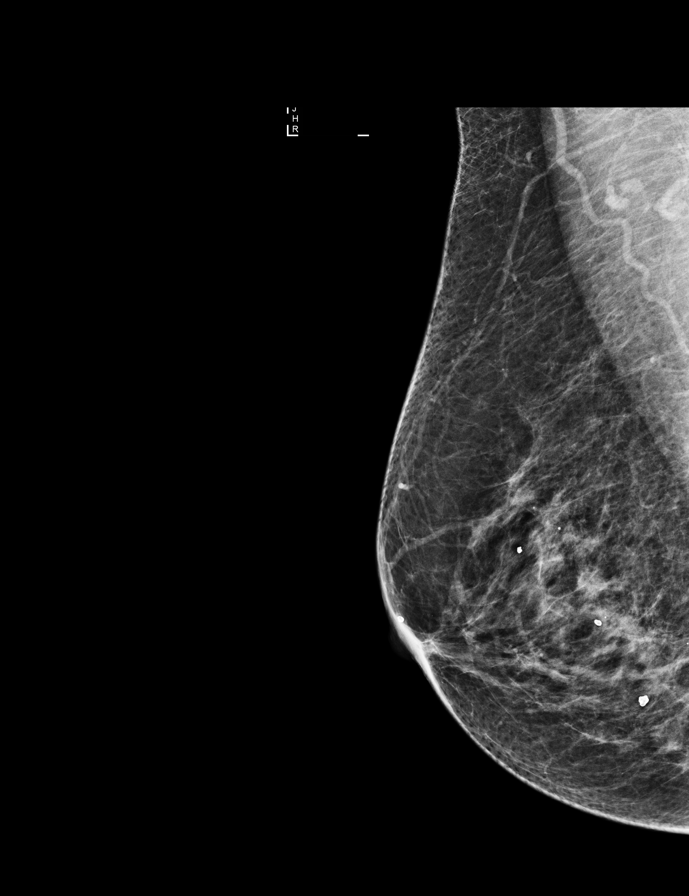

[R CC tomo · tomo slice 33/64.0]
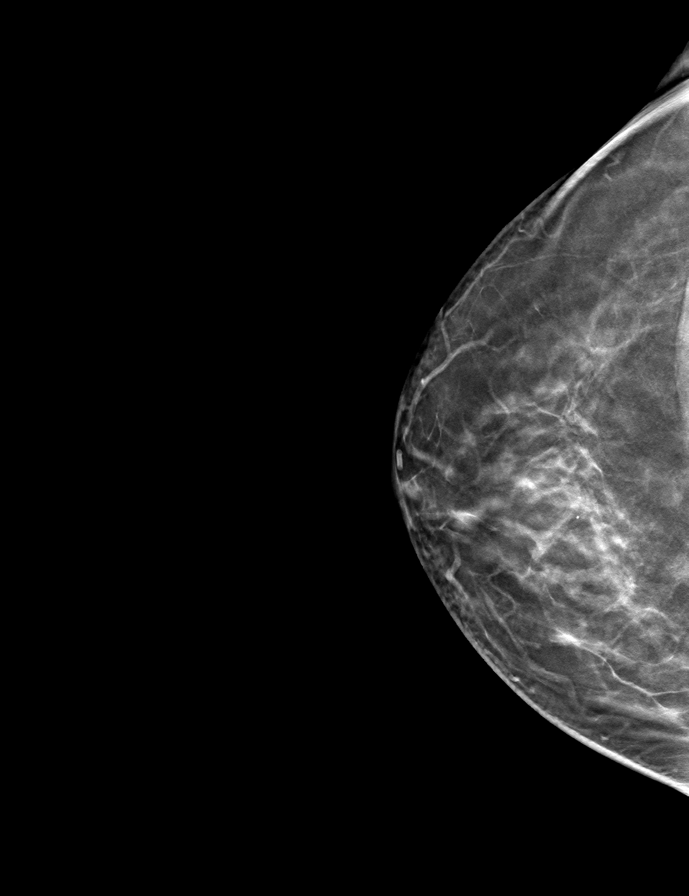

[9 of 28 positions shown; findings below may reference images not displayed]

ACR Breast Density Category c: The breast tissue is heterogeneously
dense, which may obscure small masses.
FINDINGS: There are no findings suspicious for malignancy. Images were
processed with CAD.
IMPRESSION: No mammographic evidence of malignancy. A result letter of this
screening mammogram will be mailed directly to the patient.

RECOMMENDATION:
Screening mammogram in one year. (Code:[id number])

BI-RADS CATEGORY  1: Negative.

## 2017-10-19 ENCOUNTER — Telehealth (HOSPITAL_COMMUNITY): Payer: Self-pay | Admitting: Vascular Surgery

## 2017-10-19 NOTE — Telephone Encounter (Signed)
Left pt message giving new pt appt w/ DB 10/3 @ 10am, asked pt to call back to confirm appt

## 2017-10-22 DIAGNOSIS — R61 Generalized hyperhidrosis: Secondary | ICD-10-CM | POA: Diagnosis not present

## 2017-11-02 DIAGNOSIS — Z23 Encounter for immunization: Secondary | ICD-10-CM | POA: Diagnosis not present

## 2017-11-05 ENCOUNTER — Ambulatory Visit (HOSPITAL_COMMUNITY)
Admission: RE | Admit: 2017-11-05 | Discharge: 2017-11-05 | Disposition: A | Discharge status: Home / Self Care | Payer: Medicare Other | Source: Ambulatory Visit | Attending: Internal Medicine | Admitting: Internal Medicine

## 2017-11-05 VITALS — BP 158/80 | HR 77 | Wt 128.4 lb

## 2017-11-05 DIAGNOSIS — R079 Chest pain, unspecified: Secondary | ICD-10-CM

## 2017-11-05 DIAGNOSIS — R002 Palpitations: Secondary | ICD-10-CM

## 2017-11-05 NOTE — Patient Instructions (Addendum)
Lab today  Your physician has requested that you have an echocardiogram. Echocardiography is a painless test that uses sound waves to create images of your heart. It provides your doctor with information about the size and shape of your heart and how well your heart's chambers and valves are working. This procedure takes approximately one hour. There are no restrictions for this procedure.  Your provider has recommended that  you wear a Zio Patch for 7 days.  This monitor will record your heart rhythm for our review.  IF you have any symptoms while wearing the monitor please press the button.  If you have any issues with the patch or you notice a red or orange light on it please call the company at [phone].  Once you remove the patch please mail it back to the company as soon as possible so we can get the results.

## 2017-11-05 NOTE — Progress Notes (Signed)
Zio patch placed onto patient.  All instructions and information reviewed with patient, they verbalize understanding with no questions. 

## 2017-11-18 DIAGNOSIS — R002 Palpitations: Secondary | ICD-10-CM | POA: Diagnosis not present

## 2017-11-24 ENCOUNTER — Encounter: Payer: Self-pay | Admitting: Physical Therapy

## 2017-11-24 ENCOUNTER — Ambulatory Visit: Payer: Medicare Other | Attending: Orthopedic Surgery | Admitting: Physical Therapy

## 2017-11-24 ENCOUNTER — Other Ambulatory Visit: Payer: Self-pay

## 2017-11-24 DIAGNOSIS — R293 Abnormal posture: Secondary | ICD-10-CM | POA: Diagnosis not present

## 2017-11-24 DIAGNOSIS — G8929 Other chronic pain: Secondary | ICD-10-CM | POA: Diagnosis not present

## 2017-11-24 DIAGNOSIS — M545 Low back pain, unspecified: Secondary | ICD-10-CM

## 2017-11-24 DIAGNOSIS — M6281 Muscle weakness (generalized): Secondary | ICD-10-CM | POA: Insufficient documentation

## 2017-11-24 NOTE — Patient Instructions (Signed)
Step 1  Step 2  Supine Bridge reps: 10  sets: 2  hold: 5  daily: 2  weekly: 7 Setup  Begin lying on your back with your arms resting at your sides, your legs bent at the knees and your feet flat on the ground. Movement  Tighten your abdominals and slowly lift your hips off the floor into a bridge position, keeping your back straight. Tip  Make sure to keep your trunk stiff throughout the exercise and your arms flat on the floor. Step 1  Step 2  Sidelying Hip Abduction reps: 10  sets: 2  hold: 5  daily: 2  weekly: 7 Setup  Begin lying on your side with your top leg straight and your bottom leg bent. Movement  Lift your top leg up toward the ceiling, then slowly lower it back down and repeat. Tip  Make sure to keep your leg straight and do not let your hips roll backward or forward during the exercise. Step 1  Step 2  Step 3  Standing Hip Flexor Stretch reps: 3  sets: 1  hold: 30  daily: 2  weekly: 7 Setup  Begin in a staggered stance position with your hands resting on your hips and the leg you are going to stretch positioned behind your body. Movement  Keeping your back straight and upright, squeeze your buttock muscles and slowly shift your weight forward until you feel a gentle stretch in the front of your hip.  Tip  Make sure to keep your hips and shoulders facing forward and do not arch your low back during the stretch.

## 2017-11-24 NOTE — Therapy (Signed)
[place], [name], [address] Phone: [phone]   Fax:  [phone]  Physical Therapy Evaluation  Patient Details  Name: Toni Medina MRN: [id number] Date of Birth: Sep 27, 1931 Referring Provider (PT): Dr. Latanya Maudlin    Encounter Date: 11/24/2017  PT End of Session - 11/25/17 0545    Visit Number  1    Number of Visits  12    Date for PT Re-Evaluation  01/05/18    PT Start Time  5397    PT Stop Time  1505    PT Time Calculation (min)  49 min    Activity Tolerance  Patient tolerated treatment well    Behavior During Therapy  [facility] for tasks assessed/performed       History reviewed. No pertinent past medical history.  Past Surgical History:  Procedure Laterality Date  . BREAST EXCISIONAL BIOPSY Left 2014    There were no vitals filed for this visit.   Subjective Assessment - 11/24/17 1423    Subjective  Patient presents with complaint of intermittent low back pain related to degenerative disc disease.  She has pain mostly at the end of the day. When she is home cleaning she is fine as long as she is bending foward.  She has trouble standing without leaning forward, walking any distance. Can't carry things out in front of her but it strains her back.  She comes to clinic familiar to me from Pilates class, reports back fatigue after class but no pain.      Pertinent History  R knee surgery    Limitations  Lifting;Standing;Walking;House hold activities;Other (comment)    How long can you sit comfortably?  not limited    How long can you stand comfortably?  standing upright, increased effort, uses hand in pocket to press hips forward, less than 10 min     How long can you walk comfortably?  not 2 blocks     Diagnostic tests  None recent     Patient Stated Goals  Patient would like to be able to walk upright     Currently in Pain?  Yes    Pain Score  1    at rest 1/10.  Can be moderate with walking  long upright   Pain Location  Back    Pain Orientation  Lower    Pain Descriptors / Indicators  Discomfort    Pain Type  Chronic pain    Pain Radiating Towards  cramps on both post thigh R>L.  (low K? )     Pain Onset  More than a month ago    Pain Frequency  Intermittent    Aggravating Factors   standing with good posture, walking     Pain Relieving Factors  rest, sitting, laying down, Mobic    Effect of Pain on Daily Activities  limits her ability to walk in the community          [facility] PT Assessment - 11/25/17 0001      Assessment   Medical Diagnosis  lumbar DDD    Referring Provider (PT)  Dr. Latanya Maudlin     Onset Date/Surgical Date  --   chronic    Next MD Visit  as needed     Prior Therapy  no but has done my Pilates class       Precautions   Precautions  None      Restrictions   Weight Bearing Restrictions  No  Balance Screen   Has the patient fallen in the past 6 months  No    Has the patient had a decrease in activity level because of a fear of falling?   No    Is the patient reluctant to leave their home because of a fear of falling?   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     Leisure  Likes to exercise       Cognition   Overall Cognitive Status  Within Functional Limits for tasks assessed      Observation/Other Assessments   Focus  on Therapeutic Outcomes (FOTO)   52%      Sensation   Light Touch  Appears Intact    Additional Comments  lies on R side and can feel Rt fingers numb      AROM   Lumbar Flexion  WFL     Lumbar Extension  WFL min discomfort    Lumbar - Right Side Bend  stiff    Lumbar - Left Side Bend  stiff    Lumbar - Right Rotation  WFL    Lumbar - Left Rotation  [facility]       Strength   Right Hip Flexion  4/5    Right Hip Extension  3/5    Right Hip ABduction  3+/5    Left Hip Flexion  4/5    Left Hip Extension  3+/5    Left Hip ABduction  4/5    Right/Left Knee  --   [facility]     Flexibility   Hamstrings  tight    Quadriceps  tight and hip flexors       Palpation   Spinal mobility  NT, no soreness with palpation    Palpation comment  non tender                Objective measurements completed on examination: See above findings.      [facility] Adult PT Treatment/Exercise - 11/25/17 0001      Bed Mobility   Bed Mobility  --   I with all      Transfers   Comments  No issues, safe and efficient       Posture/Postural Control   Posture/Postural Control  Postural limitations    Postural Limitations  Rounded Shoulders;Forward head;Decreased lumbar lordosis;Posterior pelvic tilt;Right pelvic obliquity;Flexed trunk      Self-Care   Self-Care  Heat/Ice Application;Other Self-Care Comments    Other Self-Care Comments   see education       Lumbar Exercises: Stretches   Hip Flexor Stretch  2 reps      Lumbar Exercises: Supine   Bridge  10 reps      Lumbar Exercises:  Sidelying   Hip Abduction  Both;10 reps             PT Education - 11/24/17 2154    Education Details  PT/POC, HEP, strength deficits, muscle imbalance , posture , eval findings     Person(s) Educated  Patient    Methods  Explanation;Demonstration;Handout;Verbal cues;Tactile cues    Comprehension  Verbalized understanding;Returned demonstration;Need further instruction          PT Long Term Goals -  11/25/17 0545      PT LONG TERM GOAL #1   Title  Pt will be I with HEP for strength, posture    Time  6    Period  Weeks    Status  New    Target Date  01/05/18      PT LONG TERM GOAL #2   Title  Pt will be able to improve her posture in her home, demonstrating good lifting mechanics for housework.     Time  6    Period  Weeks    Status  New    Target Date  01/05/18      PT LONG TERM GOAL #3   Title  Pt will be able to walk/shop for 20-30 min in the community without buggy, less fatigue in low back.     Time  6    Period  Weeks    Status  New    Target Date  01/05/18      PT LONG TERM GOAL #4   Title  Pt will be able to increase hip strength to 4/5 overall or greater (abduction, extension) for improved gait, stability     Time  6    Period  Weeks    Status  New    Target Date  01/05/18      PT LONG TERM GOAL #5   Title  FOTO score will improve by 10% ore more showing improved functional mobility.     Time  6    Period  Weeks    Status  New    Target Date  01/05/18             Plan - 11/25/17 0549    Clinical Impression Statement  This patient presents for low complexity eval of chronic low back pain which intereferes with her comfort at night, ability to stand, walk as she would like to in her home and the community.  She presents with muscular imbalance and weakness in her hips.  She has decent core strength but her lack of hip strength and back extensor strength limits her upright posture as she walks.  Her pain is not radicular but does increase when she is standing.  She does not use good body mechanics for lifting, ADLs.  She will benefit from PT to address her impairments and possibly return to mat based Pilates in the future.      Clinical Presentation  Stable    Clinical Decision Making  Low    Rehab Potential  Excellent    PT Frequency  2x / week    PT Duration  6 weeks    PT Treatment/Interventions  ADLs/Self Care Home Management;Moist Heat;Therapeutic  activities;Therapeutic exercise;Patient/family education;Balance training;Functional mobility training;Neuromuscular re-education;Manual techniques    PT Next Visit Plan  check HEP, begin posture, lifting, core .NuStep or UBE    PT Home Exercise Plan  hip abduction, bridge, standing hip flexor    Consulted and Agree with Plan of Care  Patient  Patient will benefit from skilled therapeutic intervention in order to improve the following deficits and impairments:  Decreased strength, Increased fascial restricitons, Impaired flexibility, Postural dysfunction, Pain, Improper body mechanics, Difficulty walking, Increased muscle spasms, Decreased mobility  Visit Diagnosis: Abnormal posture  Muscle weakness (generalized)  Chronic midline low back pain without sciatica     Problem List There are no active problems to display for this patient.   Silver Parkey 11/25/2017, 6:04 AM  [place], [name], [address] Phone: [phone]   Fax:  [phone]  Name: Toni Medina MRN: [id number] Date of Birth: May 12, 1931  [name], PT 11/25/17 6:05 AM Phone: [phone] Fax: [phone]

## 2017-11-25 ENCOUNTER — Telehealth (HOSPITAL_COMMUNITY): Payer: Self-pay

## 2017-11-25 NOTE — Telephone Encounter (Signed)
Pt called voice mail left for pt to call back Monitor ok. Please let her know.

## 2017-12-08 ENCOUNTER — Ambulatory Visit: Payer: Medicare Other | Attending: Orthopedic Surgery | Admitting: Physical Therapy

## 2017-12-08 ENCOUNTER — Encounter: Payer: Self-pay | Admitting: Physical Therapy

## 2017-12-08 DIAGNOSIS — M545 Low back pain, unspecified: Secondary | ICD-10-CM

## 2017-12-08 DIAGNOSIS — Z0000 Encounter for general adult medical examination without abnormal findings: Secondary | ICD-10-CM | POA: Diagnosis not present

## 2017-12-08 DIAGNOSIS — R293 Abnormal posture: Secondary | ICD-10-CM | POA: Diagnosis not present

## 2017-12-08 DIAGNOSIS — R232 Flushing: Secondary | ICD-10-CM | POA: Diagnosis not present

## 2017-12-08 DIAGNOSIS — G8929 Other chronic pain: Secondary | ICD-10-CM | POA: Insufficient documentation

## 2017-12-08 DIAGNOSIS — E7800 Pure hypercholesterolemia, unspecified: Secondary | ICD-10-CM | POA: Diagnosis not present

## 2017-12-08 DIAGNOSIS — M6281 Muscle weakness (generalized): Secondary | ICD-10-CM

## 2017-12-08 DIAGNOSIS — Z1389 Encounter for screening for other disorder: Secondary | ICD-10-CM | POA: Diagnosis not present

## 2017-12-08 NOTE — Therapy (Signed)
[name], [name], [address] Phone: [phone]   Fax:  [phone]  Physical Therapy Treatment  Patient Details  Name: Toni Medina MRN: [id number] Date of Birth: 1931/02/20 Referring Provider (PT): Dr. Latanya Maudlin    Encounter Date: 12/08/2017  PT End of Session - 12/08/17 1813    Visit Number  2    Number of Visits  12    Date for PT Re-Evaluation  01/05/18    PT Start Time  1500    PT Stop Time  1546    PT Time Calculation (min)  46 min    Activity Tolerance  Patient tolerated treatment well    Behavior During Therapy  [facility] for tasks assessed/performed       History reviewed. No pertinent past medical history.  Past Surgical History:  Procedure Laterality Date  . BREAST EXCISIONAL BIOPSY Left 2014    There were no vitals filed for this visit.  Subjective Assessment - 12/08/17 1508    Subjective  It is OK today, as the day goes on it hurts more and more.      Currently in Pain?  Yes    Pain Score  3     Pain Location  Back    Pain Orientation  Lower    Pain Descriptors / Indicators  Discomfort    Pain Onset  More than a month ago    Pain Frequency  Intermittent         [facility] Adult PT Treatment/Exercise - 12/08/17 0001      Lumbar Exercises: Stretches   Lower Trunk Rotation  10 seconds    Lower Trunk Rotation Limitations  x10     Hip Flexor Stretch  Right;Left;2 reps;30 seconds    Other Lumbar Stretch Exercise  prone quad stretch x 5, 30 sec each side L side felt tighter       Lumbar Exercises: Aerobic   Nustep  6 min L 5 UE and LE       Lumbar Exercises: Supine   Bridge  10 reps    Bridge Limitations  hold 5 sec       Lumbar Exercises: Sidelying   Hip Abduction  Both;10 reps    Hip Abduction Weights (lbs)  cues , manual for form       Lumbar Exercises: Prone   Straight Leg Raise  20 reps      Manual Therapy   Manual Therapy  Passive ROM    Passive ROM  knee flexion  in prone for quad stretch x 3              PT Education - 12/08/17 1813    Education Details  form with HEP, anterior hip tightness     Person(s) Educated  Patient    Methods  Explanation;Handout    Comprehension  Verbalized understanding          PT Long Term Goals - 11/25/17 0545      PT LONG TERM GOAL #1   Title  Pt will be I with HEP for strength, posture    Time  6    Period  Weeks    Status  New    Target Date  01/05/18      PT LONG TERM GOAL #2   Title  Pt will be able to improve her posture in her home, demonstrating good lifting mechanics for housework.     Time  6  Period  Weeks    Status  New    Target Date  01/05/18      PT LONG TERM GOAL #3   Title  Pt will be able to walk/shop for 20-30 min in the community without buggy, less fatigue in low back.     Time  6    Period  Weeks    Status  New    Target Date  01/05/18      PT LONG TERM GOAL #4   Title  Pt will be able to increase hip strength to 4/5 overall or greater (abduction, extension) for improved gait, stability     Time  6    Period  Weeks    Status  New    Target Date  01/05/18      PT LONG TERM GOAL #5   Title  FOTO score will improve by 10% ore more showing improved functional mobility.     Time  6    Period  Weeks    Status  New    Target Date  01/05/18            Plan - 12/08/17 1509    Clinical Impression Statement  Patient tight in quads and hip flexors, worked on balancing  flexibility of anterior hip, strength of posterolateral hip to support back in standing. No increased pain.      PT Treatment/Interventions  ADLs/Self Care Home Management;Moist Heat;Therapeutic activities;Therapeutic exercise;Patient/family education;Balance training;Functional mobility training;Neuromuscular re-education;Manual techniques    PT Next Visit Plan  check HEP, begin posture, lifting, core .NuStep or UBE    PT Home Exercise Plan  hip abduction, bridge, standing hip flexor    Consulted  and Agree with Plan of Care  Patient       Patient will benefit from skilled therapeutic intervention in order to improve the following deficits and impairments:  Decreased strength, Increased fascial restricitons, Impaired flexibility, Postural dysfunction, Pain, Improper body mechanics, Difficulty walking, Increased muscle spasms, Decreased mobility  Visit Diagnosis: Abnormal posture  Muscle weakness (generalized)  Chronic midline low back pain without sciatica     Problem List There are no active problems to display for this patient.   , 12/08/2017, 6:17 PM  [name], [name], [address] Phone: [phone]   Fax:  [phone]  Name: Toni Medina MRN: [id number] Date of Birth: 06-05-1931  [name], PT 12/08/17 6:23 PM Phone: [phone] Fax: [phone]

## 2017-12-10 ENCOUNTER — Ambulatory Visit: Payer: Medicare Other | Admitting: Physical Therapy

## 2017-12-10 ENCOUNTER — Encounter: Payer: Self-pay | Admitting: Physical Therapy

## 2017-12-10 DIAGNOSIS — R293 Abnormal posture: Secondary | ICD-10-CM | POA: Diagnosis not present

## 2017-12-10 DIAGNOSIS — G8929 Other chronic pain: Secondary | ICD-10-CM

## 2017-12-10 DIAGNOSIS — M545 Low back pain, unspecified: Secondary | ICD-10-CM

## 2017-12-10 DIAGNOSIS — M6281 Muscle weakness (generalized): Secondary | ICD-10-CM | POA: Diagnosis not present

## 2017-12-10 NOTE — Patient Instructions (Addendum)
        Voncille Lo, PT Certified Exercise Expert for the Aging Adult  12/10/17 2:11 PM Phone: [phone] Fax: [phone]

## 2017-12-10 NOTE — Therapy (Signed)
[place], [name], [address] Phone: [phone]   Fax:  [phone]  Physical Therapy Treatment  Patient Details  Name: Toni Medina MRN: [id number] Date of Birth: 07/03/1931 Referring Provider (PT): Dr. Latanya Maudlin    Encounter Date: 12/10/2017  PT End of Session - 12/10/17 1318    Visit Number  3    Number of Visits  12    PT Start Time  9163    PT Stop Time  1417    PT Time Calculation (min)  49 min    Activity Tolerance  Patient tolerated treatment well    Behavior During Therapy  [facility] for tasks assessed/performed       History reviewed. No pertinent past medical history.  Past Surgical History:  Procedure Laterality Date  . BREAST EXCISIONAL BIOPSY Left 2014    There were no vitals filed for this visit.  Subjective Assessment - 12/10/17 1335    Subjective  Pt is 4/10 pain in back and and a little pain in right knee ( knee valgus)  (Pended)     Pertinent History  R knee surgery  (Pended)     Patient Stated Goals  Patient would like to be able to walk upright   (Pended)     Currently in Pain?  Yes    Pain Score  4     Pain Location  Back    Pain Orientation  Lower    Pain Descriptors / Indicators  Discomfort    Pain Type  Chronic pain    Pain Onset  More than a month ago                       [facility] Adult PT Treatment/Exercise - 12/10/17 1419      Self-Care   Self-Care  Other Self-Care Comments    Other Self-Care Comments   self myofascial release of hip flexor in prone with quad stretch      Lumbar Exercises: Stretches   Hip Flexor Stretch  Right;Left;30 seconds;5 reps   added to HEP for prone and supine stretch/psoterior pelvitil   Other Lumbar Stretch Exercise  prone quad stretch x 5, 30 sec each side L side felt tighter    added Tennis ball for self myofascial release of hip flexor     Lumbar Exercises: Aerobic   Nustep  6 min L 5 UE and LE       Lumbar  Exercises: Standing   Other Standing Lumbar Exercises  standing hip flexor stretch 2 x on left by counter,  sit to stand holding onto counterwith glut activation 2  x 10    Other Standing Lumbar Exercises  UE flex alternating with pillow case wall slide x 10 and then add hip ext with alternate UE flex  x 10 each      Lumbar Exercises: Supine   Bridge  10 reps   x 2 1/2 range   right LE forward to decrease spasm   Bridge Limitations  hold 5 sec       Lumbar Exercises: Sidelying   Hip Abduction  Both;10 reps;Left;Right    Hip Abduction Weights (lbs)  cues , manual for form       Lumbar Exercises: Prone   Straight Leg Raise  20 reps             PT Education - 12/10/17 1342    Education Details  Added to HEP for  hip flexion stretch and posterior chain    Person(s) Educated  Patient    Methods  Explanation;Demonstration;Tactile cues;Verbal cues;Handout    Comprehension  Verbalized understanding          PT Long Term Goals - 12/10/17 1319      PT LONG TERM GOAL #1   Title  Pt will be I with HEP for strength, posture    Baseline  needs reinforcement for proper execution    Time  6    Period  Weeks    Status  On-going      PT LONG TERM GOAL #2   Title  Pt will be able to improve her posture in her home, demonstrating good lifting mechanics for housework.     Time  6    Period  Weeks    Status  On-going      PT LONG TERM GOAL #3   Title  Pt will be able to walk/shop for 20-30 min in the community without buggy, less fatigue in low back.     Time  6    Period  Weeks    Status  On-going      PT LONG TERM GOAL #4   Title  Pt will be able to increase hip strength to 4/5 overall or greater (abduction, extension) for improved gait, stability     Time  6    Period  Weeks    Status  On-going      PT LONG TERM GOAL #5   Title  FOTO score will improve by 10% ore more showing improved functional mobility.     Time  6    Period  Weeks    Status  On-going             Plan - 12/10/17 1427    Clinical Impression Statement  Pt with flexed posture and tries to correct with VC . Pt with tight quads and hip flexors.  L > Right and responded well to addition of HEP and sit to stand/  Emphasis on flexibility of posteiror chain and strength of posterior chain  will continue to progress for goal achievment    Rehab Potential  Excellent    PT Frequency  2x / week    PT Duration  6 weeks    PT Treatment/Interventions  ADLs/Self Care Home Management;Moist Heat;Therapeutic activities;Therapeutic exercise;Patient/family education;Balance training;Functional mobility training;Neuromuscular re-education;Manual techniques    PT Next Visit Plan  begin posture, lifting, core check sit to stand at sink and progress hip extensnon on raised seat for one leg sit to stand when able    PT Home Exercise Plan  hip abduction, bridge, standing hip flexor hip flexor stretch in prone/ supine, sit to stand.  UE wall slide with hip extension    Consulted and Agree with Plan of Care  Patient       Patient will benefit from skilled therapeutic intervention in order to improve the following deficits and impairments:  Decreased strength, Increased fascial restricitons, Impaired flexibility, Postural dysfunction, Pain, Improper body mechanics, Difficulty walking, Increased muscle spasms, Decreased mobility  Visit Diagnosis: Abnormal posture  Muscle weakness (generalized)  Chronic midline low back pain without sciatica     Problem List There are no active problems to display for this patient.   Voncille Lo, PT Certified Exercise Expert for the Aging Adult  12/10/17 2:30 PM Phone: [phone] Fax: [place] [facility] [address] [place], [name], [address] Phone: [phone]  Fax:  [phone]  Name: Toni Medina MRN: [id number] Date of Birth: 07-09-1931

## 2017-12-15 ENCOUNTER — Ambulatory Visit: Payer: Medicare Other | Admitting: Physical Therapy

## 2017-12-15 ENCOUNTER — Encounter: Payer: Self-pay | Admitting: Physical Therapy

## 2017-12-15 DIAGNOSIS — R293 Abnormal posture: Secondary | ICD-10-CM | POA: Diagnosis not present

## 2017-12-15 DIAGNOSIS — G8929 Other chronic pain: Secondary | ICD-10-CM | POA: Diagnosis not present

## 2017-12-15 DIAGNOSIS — M545 Low back pain, unspecified: Secondary | ICD-10-CM

## 2017-12-15 DIAGNOSIS — M6281 Muscle weakness (generalized): Secondary | ICD-10-CM

## 2017-12-15 NOTE — Therapy (Signed)
[place], [name], [address] Phone: [phone]   Fax:  [phone]  Physical Therapy Treatment  Patient Details  Name: Toni Medina MRN: [id number] Date of Birth: 03-Oct-1931 Referring Provider (PT): Dr. Latanya Maudlin    Encounter Date: 12/15/2017  PT End of Session - 12/15/17 1021    Visit Number  4    Number of Visits  12    Date for PT Re-Evaluation  01/05/18    PT Start Time  1017    PT Stop Time  1058    PT Time Calculation (min)  41 min    Activity Tolerance  Patient tolerated treatment well    Behavior During Therapy  [facility] for tasks assessed/performed       History reviewed. No pertinent past medical history.  Past Surgical History:  Procedure Laterality Date  . BREAST EXCISIONAL BIOPSY Left 2014    There were no vitals filed for this visit.  Subjective Assessment - 12/15/17 1019    Subjective  Right now none, walking it is 4/10.  The same.      Currently in Pain?  Yes    Pain Score  4     Pain Location  Back    Pain Orientation  Lower    Pain Descriptors / Indicators  Discomfort    Pain Type  Chronic pain    Pain Onset  More than a month ago    Pain Frequency  Intermittent             [facility] Adult PT Treatment/Exercise - 12/15/17 0001      Lumbar Exercises: Stretches   Active Hamstring Stretch  3 reps    Pelvic Tilt  10 reps      Lumbar Exercises: Standing   Wall Slides  15 reps    Row  Strengthening;Both;20 reps;Theraband    Theraband Level (Row)  Level 3 (Green)    Shoulder Extension  Strengthening;Both;20 reps;Theraband    Theraband Level (Shoulder Extension)  Level 3 (Green)    Other Standing Lumbar Exercises  overhead cane flexion x 10, very challenging to maintain thoracic extension     Other Standing Lumbar Exercises  wall weight shifts facing x 6 each leg       Lumbar Exercises: Supine   Bridge  10 reps   x 2 1/2 range   right LE forward to decrease spasm    Bridge Limitations  hold 5 sec     Other Supine Lumbar Exercises  supine shoulder flexion with cane x 10       Lumbar Exercises: Prone   Straight Leg Raise  20 reps      Lumbar Exercises: Quadruped   Single Arm Raise  10 reps    Straight Leg Raise  10 reps    Opposite Arm/Leg Raise  10 reps    Other Quadruped Lumbar Exercises  used ball but she flexed thoracic spine to much              PT Education - 12/15/17 1057    Education Details  posture, lifting  and HEP for upper back , extensor strength     Person(s) Educated  Patient    Methods  Explanation;Handout    Comprehension  Verbalized understanding;Verbal cues required;Tactile cues required;Need further instruction          PT Long Term Goals - 12/15/17 1039      PT LONG TERM GOAL #1   Title  Pt will be I with HEP for strength, posture    Status  On-going      PT LONG TERM GOAL #2   Title  Pt will be able to improve her posture in her home, demonstrating good lifting mechanics for housework.     Status  On-going      PT LONG TERM GOAL #3   Title  Pt will be able to walk/shop for 20-30 min in the community without buggy, less fatigue in low back.     Status  On-going      PT LONG TERM GOAL #4   Title  Pt will be able to increase hip strength to 4/5 overall or greater (abduction, extension) for improved gait, stability     Status  On-going      PT LONG TERM GOAL #5   Title  FOTO score will improve by 10% ore more showing improved functional mobility.     Status  On-going            Plan - 12/15/17 1112    Clinical Impression Statement  Continued to work on posterior chain strengthening.  Some fatigue in UE but no pain during session.  Unable to lean on wall and achieve full ROM in flexion due to thoracic stiffness, decreased shoulder mobility. No goals met but she is appreciative of HEP and guidance related to posture, body mechanics .    PT Treatment/Interventions  ADLs/Self Care Home  Management;Moist Heat;Therapeutic activities;Therapeutic exercise;Patient/family education;Balance training;Functional mobility training;Neuromuscular re-education;Manual techniques    PT Next Visit Plan  posture, post chain, gait with thoracic extension    PT Home Exercise Plan  hip abduction, bridge, standing hip flexor hip flexor stretch in prone/ supine, sit to stand.  UE wall slide with hip extension, row, ext and bird dog     Consulted and Agree with Plan of Care  Patient       Patient will benefit from skilled therapeutic intervention in order to improve the following deficits and impairments:  Decreased strength, Increased fascial restricitons, Impaired flexibility, Postural dysfunction, Pain, Improper body mechanics, Difficulty walking, Increased muscle spasms, Decreased mobility  Visit Diagnosis: Abnormal posture  Muscle weakness (generalized)  Chronic midline low back pain without sciatica     Problem List There are no active problems to display for this patient.   PAA,JENNIFER 12/15/2017, 11:17 AM  [place], [name], [address] Phone: [phone]   Fax:  [phone]  Name: Toni Medina MRN: [id number] Date of Birth: 1931/07/19  [name], PT 12/15/17 11:17 AM Phone: [phone] Fax: [phone]

## 2017-12-15 NOTE — Patient Instructions (Signed)
Low Row: Standing    Face anchor, feet shoulder width apart. Palms up, pull arms back, squeezing shoulder blades together. Repeat _15-20_ times per set. Do _1-2_ sets per session. Do _5-7_ sessions per week. Anchor Height: Waist  http://tub.exer.us/66   Copyright  VHI. All rights reserved.    EXTENSION: Standing - Resistance Band: Stable (Active)    Stand, right arm at side. Against yellow resistance band, draw arm backward, as far as possible, keeping elbow straight. Complete __1- 2_ sets of _15-20 __ repetitions. Perform __1_ sessions per day.  Copyright  VHI. All rights reserved.    Bird Dog Pose - Intermediate    Keep pelvis stable. From table pose, step one leg back to plank pose. Reach opposite arm forward, back foot off floor.  Hold for __5-10 sec __ breaths. Return arm and leg at same rate. Repeat __10 __ times, alternating sides.  Copyright  VHI. All rights reserved.

## 2017-12-17 ENCOUNTER — Ambulatory Visit: Payer: Medicare Other | Admitting: Physical Therapy

## 2017-12-17 ENCOUNTER — Encounter: Payer: Self-pay | Admitting: Physical Therapy

## 2017-12-17 DIAGNOSIS — G8929 Other chronic pain: Secondary | ICD-10-CM

## 2017-12-17 DIAGNOSIS — M545 Low back pain, unspecified: Secondary | ICD-10-CM

## 2017-12-17 DIAGNOSIS — M6281 Muscle weakness (generalized): Secondary | ICD-10-CM

## 2017-12-17 DIAGNOSIS — R293 Abnormal posture: Secondary | ICD-10-CM | POA: Diagnosis not present

## 2017-12-17 NOTE — Therapy (Signed)
[place], [name], [address] Phone: [phone]   Fax:  [phone]  Physical Therapy Treatment  Patient Details  Name: Toni Medina MRN: [id number] Date of Birth: 1931-09-01 Referring Provider (PT): Dr. Latanya Maudlin    Encounter Date: 12/17/2017  PT End of Session - 12/17/17 1030    Visit Number  5    Number of Visits  12    Date for PT Re-Evaluation  01/05/18    PT Start Time  1018    PT Stop Time  1105    PT Time Calculation (min)  47 min    Activity Tolerance  Patient tolerated treatment well    Behavior During Therapy  [facility] for tasks assessed/performed       History reviewed. No pertinent past medical history.  Past Surgical History:  Procedure Laterality Date  . BREAST EXCISIONAL BIOPSY Left 2014    There were no vitals filed for this visit.  Subjective Assessment - 12/17/17 1110    Subjective  I almost cancelled.  My knee was really hurting after last visit but it recovered.  None now.     Currently in Pain?  No/denies        Pilates Reformer used for LE/core strength, postural strength, lumbopelvic disassociation and core control.  Exercises included:  Footwork 2 Red 1 blue parallel, heels/arch and forefoot.   Rt genu valgus  Heel raises, stretching max cues for coordinating   Clam green band x 10 unilateral   Bridging with band x 10   Supine Arm work 1 [employer], cues for abdominals and full elbow extension   Hamstring and Anterior hip stretch 1 Red 30 sec each LE   Long box Prone 1 Red overhead press, double arm and single arm   Cues needed for scapular position and chin tuck  Fatigue in posterior hips, hamstring just holding position  Baby swan 1 Red x 10 with good alignment in upper back, neck.       PT Education - 12/17/17 1111    Education Details  Pilates Reformer     Person(s) Educated  Patient    Methods  Explanation;Verbal cues    Comprehension   Verbalized understanding          PT Long Term Goals - 12/15/17 1039      PT LONG TERM GOAL #1   Title  Pt will be I with HEP for strength, posture    Status  On-going      PT LONG TERM GOAL #2   Title  Pt will be able to improve her posture in her home, demonstrating good lifting mechanics for housework.     Status  On-going      PT LONG TERM GOAL #3   Title  Pt will be able to walk/shop for 20-30 min in the community without buggy, less fatigue in low back.     Status  On-going      PT LONG TERM GOAL #4   Title  Pt will be able to increase hip strength to 4/5 overall or greater (abduction, extension) for improved gait, stability     Status  On-going      PT LONG TERM GOAL #5   Title  FOTO score will improve by 10% ore more showing improved functional mobility.     Status  On-going            Plan - 12/17/17 1031    Clinical Impression  Statement  Introduced Pilates Refomer to patient to improve body awareness and postural alignment. Used props for improving alignment especially in severe valgus.  She had improved bridge height with footbar and was able to do swan with excellent head neck and shoulder alignment.     PT Treatment/Interventions  ADLs/Self Care Home Management;Moist Heat;Therapeutic activities;Therapeutic exercise;Patient/family education;Balance training;Functional mobility training;Neuromuscular re-education;Manual techniques    PT Next Visit Plan  Reformer again? Cont prone/posterior chain     PT Home Exercise Plan  hip abduction, bridge, standing hip flexor hip flexor stretch in prone/ supine, sit to stand.  UE wall slide with hip extension, row, ext and bird dog     Consulted and Agree with Plan of Care  Patient       Patient will benefit from skilled therapeutic intervention in order to improve the following deficits and impairments:  Decreased strength, Increased fascial restricitons, Impaired flexibility, Postural dysfunction, Pain, Improper body  mechanics, Difficulty walking, Increased muscle spasms, Decreased mobility  Visit Diagnosis: Abnormal posture  Muscle weakness (generalized)  Chronic midline low back pain without sciatica     Problem List There are no active problems to display for this patient.   Garnett Nunziata 12/17/2017, 11:16 AM  [place], [name], [address] Phone: [phone]   Fax:  [phone]  Name: Toni Medina MRN: [id number] Date of Birth: 04/16/1931  [name], PT 12/17/17 11:18 AM Phone: [phone] Fax: [phone]

## 2017-12-21 ENCOUNTER — Ambulatory Visit: Payer: Medicare Other | Admitting: Physical Therapy

## 2017-12-21 ENCOUNTER — Encounter: Payer: Self-pay | Admitting: Physical Therapy

## 2017-12-21 DIAGNOSIS — M6281 Muscle weakness (generalized): Secondary | ICD-10-CM

## 2017-12-21 DIAGNOSIS — M545 Low back pain, unspecified: Secondary | ICD-10-CM

## 2017-12-21 DIAGNOSIS — R293 Abnormal posture: Secondary | ICD-10-CM | POA: Diagnosis not present

## 2017-12-21 DIAGNOSIS — G8929 Other chronic pain: Secondary | ICD-10-CM

## 2017-12-21 NOTE — Therapy (Signed)
[place], [name], [address] Phone: [phone]   Fax:  [phone]  Physical Therapy Treatment  Patient Details  Name: Toni Medina MRN: [id number] Date of Birth: [birth year] Referring Provider (PT): Dr. Latanya Maudlin    Encounter Date: 12/21/2017  PT End of Session - 12/21/17 1021    Visit Number  6    Number of Visits  12    Date for PT Re-Evaluation  01/05/18    PT Start Time  1018    PT Stop Time  1116    PT Time Calculation (min)  58 min    Activity Tolerance  Patient tolerated treatment well    Behavior During Therapy  [facility] for tasks assessed/performed       History reviewed. No pertinent past medical history.  Past Surgical History:  Procedure Laterality Date  . BREAST EXCISIONAL BIOPSY Left 2014    There were no vitals filed for this visit.  Subjective Assessment - 12/21/17 1015    Subjective  No pain today, I tried some exercise this weekend.     Currently in Pain?  No/denies         Pilates Reformer used for LE/core strength, postural strength, lumbopelvic disassociation and core control.  Exercises included: Footwork   2 Red 1 blue parallel heels, turn out heels, also for parallel toes and tendon stretch   Poor Rt LE alignment with hip ER  2 Red for single leg work, cues to find tabletop and stabilize pelvis   Bridging 3 springs with ball cues to keep carriage still    Long box Prone 1 blue spring pulling straps   Shoulder extension , thoracic extension and horizontal abd x10 each        [facility] Adult PT Treatment/Exercise - 12/21/17 0001      Pilates   Pilates Reformer  See note      Lumbar Exercises: Stretches   Lower Trunk Rotation  10 seconds    Lower Trunk Rotation Limitations  x10     Hip Flexor Stretch  Right;3 reps;30 seconds    Other Lumbar Stretch Exercise  anterior hip stretch on foam roller each side x 3       Lumbar Exercises: Standing   Other  Standing Lumbar Exercises  horizontal pull green x 10 and then 10 sec hold x 2       Lumbar Exercises: Sidelying   Clam  Both;20 reps    Clam Limitations  green band, much harder on Rt side     Hip Abduction  Right;Left;Both;10 reps   2 sets added circles     Modalities   Modalities  Moist Heat      Moist Heat Therapy   Number Minutes Moist Heat  10 Minutes    Moist Heat Location  Lumbar Spine                  PT Long Term Goals - 12/21/17 1059      PT LONG TERM GOAL #1   Title  Pt will be I with HEP for strength, posture    Status  On-going      PT LONG TERM GOAL #2   Title  Pt will be able to improve her posture in her home, demonstrating good lifting mechanics for housework.     Status  On-going      PT LONG TERM GOAL #3   Title  Pt will be able to walk/shop for  20-30 min in the community without buggy, less fatigue in low back.     Status  On-going      PT LONG TERM GOAL #4   Title  Pt will be able to increase hip strength to 4/5 overall or greater (abduction, extension) for improved gait, stability     Baseline  3+/5 to 4/5      PT LONG TERM GOAL #5   Title  FOTO score will improve by 10% ore more showing improved functional mobility.     Status  On-going            Plan - 12/21/17 1039    Clinical Impression Statement  Extension based exercises on Reformer are beneficial to her.  Patient always feels stronger and better after PT but it does not last.  She tries to be more conscioius     PT Treatment/Interventions  ADLs/Self Care Home Management;Moist Heat;Therapeutic activities;Therapeutic exercise;Patient/family education;Balance training;Functional mobility training;Neuromuscular re-education;Manual techniques    PT Next Visit Plan  Reformer again? Cont prone/posterior chain     Consulted and Agree with Plan of Care  Patient       Patient will benefit from skilled therapeutic intervention in order to improve the following deficits and  impairments:  Decreased strength, Increased fascial restricitons, Impaired flexibility, Postural dysfunction, Pain, Improper body mechanics, Difficulty walking, Increased muscle spasms, Decreased mobility  Visit Diagnosis: Abnormal posture  Muscle weakness (generalized)  Chronic midline low back pain without sciatica     Problem List There are no active problems to display for this patient.   Nieves Chapa 12/21/2017, 11:09 AM  [place], [name], [address] Phone: [phone]   Fax:  [phone]  Name: Toni Medina MRN: [id number] Date of Birth: [birth year]  [name], PT 12/21/17 11:09 AM Phone: [phone] Fax: [phone]

## 2017-12-23 DIAGNOSIS — Z961 Presence of intraocular lens: Secondary | ICD-10-CM | POA: Diagnosis not present

## 2017-12-23 DIAGNOSIS — H52203 Unspecified astigmatism, bilateral: Secondary | ICD-10-CM | POA: Diagnosis not present

## 2017-12-23 DIAGNOSIS — H43813 Vitreous degeneration, bilateral: Secondary | ICD-10-CM | POA: Diagnosis not present

## 2017-12-23 DIAGNOSIS — H02051 Trichiasis without entropian right upper eyelid: Secondary | ICD-10-CM | POA: Diagnosis not present

## 2017-12-24 ENCOUNTER — Ambulatory Visit: Payer: Medicare Other | Admitting: Physical Therapy

## 2017-12-24 ENCOUNTER — Encounter: Payer: Self-pay | Admitting: Physical Therapy

## 2017-12-24 DIAGNOSIS — R293 Abnormal posture: Secondary | ICD-10-CM

## 2017-12-24 DIAGNOSIS — M545 Low back pain, unspecified: Secondary | ICD-10-CM

## 2017-12-24 DIAGNOSIS — M6281 Muscle weakness (generalized): Secondary | ICD-10-CM

## 2017-12-24 DIAGNOSIS — G8929 Other chronic pain: Secondary | ICD-10-CM | POA: Diagnosis not present

## 2017-12-24 NOTE — Patient Instructions (Signed)
Prone Quadriceps Stretch with Strap reps: 3 sets: 1 hold: 30 daily: 1  weekly: 7      Exercise image step 1   Exercise image step 2  Setup  Begin lying on your front with your legs straight, holding the end of a strap that is looped around one foot.  Movement  Pull the end of the strap over your shoulder on the same side of your body, bending your knee, until you feel a gentle stretch in your thigh.  Tip  Do not let your low back arch during the stretch. Standing Hip Flexor Stretch on Chair reps: 3 sets: 1 hold: 30 daily: 1  weekly: 7      Exercise image step 1   Exercise image step 2  Setup  Begin standing upright with one leg bent and resting on a stable chair behind you. Movement  Engage your abdominal muscles, then slightly shift your weight forward at your hips. You should feel a gentle stretch in the front of the hip of your bent leg. Tip  Make sure to use a sturdy chair and avoid arching your back during the stretch.

## 2017-12-24 NOTE — Therapy (Signed)
Toni Medina, [name], [address] Phone: [phone]   Fax:  [phone]  Physical Therapy Treatment  Patient Details  Name: Toni Medina MRN: [id number] Date of Birth: 1931/11/28 Referring Provider (PT): Dr. Latanya Maudlin    Encounter Date: 12/24/2017  PT End of Session - 12/24/17 1243    Visit Number  7    Number of Visits  12    Date for PT Re-Evaluation  01/05/18    PT Start Time  1017    PT Stop Time  1250    PT Time Calculation (min)  62 min    Activity Tolerance  Patient tolerated treatment well    Behavior During Therapy  [facility] for tasks assessed/performed       History reviewed. No pertinent past medical history.  Past Surgical History:  Procedure Laterality Date  . BREAST EXCISIONAL BIOPSY Left 2014    There were no vitals filed for this visit.  Subjective Assessment - 12/24/17 1153    Subjective  Yesterday I was doing alot of errands, in and out and up and down.  Pain got up to a 5/10.  Went back down to none when I sat, rested.  I think I want to continue PT.      Currently in Pain?  No/denies         [facility] PT Assessment - 12/24/17 0001      Strength   Right Hip Flexion  4/5    Left Hip Flexion  4/5    Right Ankle Dorsiflexion  5/5    Right Ankle Plantar Flexion  4/5    Left Ankle Dorsiflexion  4+/5    Left Ankle Plantar Flexion  4/5      Palpation   Palpation comment  L4 min pain           [facility] Adult PT Treatment/Exercise - 12/24/17 0001      Self-Care   Other Self-Care Comments   stretch out strap, gait cues, POC, progress, HEP       Lumbar Exercises: Production assistant, radio Limitations  30 sec each side x 2 methods      Lumbar Exercises: Aerobic   Tread Mill  1.3 mph max 0 % grade cues to increase stride length    6 min      Lumbar Exercises: Seated   Other Seated Lumbar Exercises  march with band x 10    cues for upright      Lumbar Exercises: Supine   Bent Knee Raise  10 reps    Bent Knee Raise Limitations  with band     Bridge  10 reps    Bridge Limitations  2 sets, 1 with band for added challenge    increased height    Other Supine Lumbar Exercises  hip flexor strengthening with band around feet, in supine       Lumbar Exercises: Prone   Straight Leg Raise  20 reps    Straight Leg Raises Limitations  cues for pelvis    Other Prone Lumbar Exercises  hip extension knee bent and knee extended     Other Prone Lumbar Exercises  quad stretching with strap prone and off table       Modalities   Modalities  Moist Heat      Moist Heat Therapy   Number Minutes Moist Heat  10 Minutes    Moist Heat Location  Lumbar Spine      Manual Therapy   Manual Therapy  Soft tissue mobilization    Soft tissue mobilization  compression, palpation to central lumbar and paraspinals, light Gr I joint mobilization for rotation at L3-L4 and P/A very gentle     Passive ROM  knee flexion              PT Education - 12/24/17 1243    Education Details  treadmill , progress, stretch out strap     Person(s) Educated  Patient    Methods  Explanation;Handout    Comprehension  Verbalized understanding          PT Long Term Goals - 12/21/17 1059      PT LONG TERM GOAL #1   Title  Pt will be I with HEP for strength, posture    Status  On-going      PT LONG TERM GOAL #2   Title  Pt will be able to improve her posture in her home, demonstrating good lifting mechanics for housework.     Status  On-going      PT LONG TERM GOAL #3   Title  Pt will be able to walk/shop for 20-30 min in the community without buggy, less fatigue in low back.     Status  On-going      PT LONG TERM GOAL #4   Title  Pt will be able to increase hip strength to 4/5 overall or greater (abduction, extension) for improved gait, stability     Baseline  3+/5 to 4/5      PT LONG TERM GOAL #5   Title  FOTO score will improve by 10% ore more showing  improved functional mobility.     Status  On-going            Plan - 12/24/17 1244    Clinical Impression Statement  Patient with tightness in anterior hip, quads.  LLE with decreased foot clearance on treadmill.  Pronounced heel strike did not improve but hip flexion cues did.  Showed variations for hip flex and quad stretching.  No pain.     PT Treatment/Interventions  ADLs/Self Care Home Management;Moist Heat;Therapeutic activities;Therapeutic exercise;Patient/family education;Balance training;Functional mobility training;Neuromuscular re-education;Manual techniques    PT Next Visit Plan  FOTO upcoming , renewal with time off 2nd week Dec. Reformer again? Cont prone/posterior chain     PT Home Exercise Plan  hip abduction, bridge, standing hip flexor hip flexor stretch in prone/ supine, sit to stand.  UE wall slide with hip extension, row, ext and bird dog     Consulted and Agree with Plan of Care  Patient       Patient will benefit from skilled therapeutic intervention in order to improve the following deficits and impairments:  Decreased strength, Increased fascial restricitons, Impaired flexibility, Postural dysfunction, Pain, Improper body mechanics, Difficulty walking, Increased muscle spasms, Decreased mobility  Visit Diagnosis: Abnormal posture  Muscle weakness (generalized)  Chronic midline low back pain without sciatica     Problem List There are no active problems to display for this patient.   Mirella Gueye 12/24/2017, 12:53 PM  [facility] [address] [place], [name], [address] Phone: [phone]   Fax:  [phone]  Name: Toni Medina MRN: [id number] Date of Birth: May 16, 1931   [name], PT 12/24/17 12:53 PM Phone: [phone] Fax: [phone]

## 2017-12-28 ENCOUNTER — Ambulatory Visit: Payer: Medicare Other | Admitting: Physical Therapy

## 2017-12-28 ENCOUNTER — Encounter: Payer: Self-pay | Admitting: Physical Therapy

## 2017-12-28 DIAGNOSIS — R293 Abnormal posture: Secondary | ICD-10-CM | POA: Diagnosis not present

## 2017-12-28 DIAGNOSIS — M545 Low back pain, unspecified: Secondary | ICD-10-CM

## 2017-12-28 DIAGNOSIS — M6281 Muscle weakness (generalized): Secondary | ICD-10-CM

## 2017-12-28 DIAGNOSIS — G8929 Other chronic pain: Secondary | ICD-10-CM

## 2017-12-28 NOTE — Patient Instructions (Signed)
Prepared By: [place], [name]  Phone: [phone]  Seated Shoulder Flexion with Self-Anchored Resistance reps: 10 sets: 2 hold: 5 daily: 1  weekly: 7      Exercise image step 1   Exercise image step 2 Setup  Begin sitting upright holding both ends of a resistance band, using one hand as an anchor. Movement  Lift your arm straight in front of you, pulling against the resistance, then slowly lower your arm back down to the starting position and repeat. Tip  Make sure to keep your back and elbow straight, and do not shrug your shoulder during the exercise.  Stand and do against the wall red unattached to keep back and shoudlers on the wall

## 2017-12-28 NOTE — Therapy (Signed)
[name], [name], [address] Phone: [phone]   Fax:  [phone]  Physical Therapy Treatment  Patient Details  Name: Toni Medina MRN: [id number] Date of Birth: 07-May-1931 Referring Provider (PT): Dr. Latanya Maudlin    Encounter Date: 12/28/2017  PT End of Session - 12/28/17 1045    Visit Number  8    Number of Visits  12    Date for PT Re-Evaluation  01/05/18    PT Start Time  1016    PT Stop Time  1110    PT Time Calculation (min)  54 min    Activity Tolerance  Patient tolerated treatment well    Behavior During Therapy  [facility] for tasks assessed/performed       History reviewed. No pertinent past medical history.  Past Surgical History:  Procedure Laterality Date  . BREAST EXCISIONAL BIOPSY Left 2014    There were no vitals filed for this visit.  Subjective Assessment - 12/28/17 1028    Subjective  No pain yet. Pt asked to walk out the back door so she didnt have to walk as far.      Currently in Pain?  No/denies            [facility] Adult PT Treatment/Exercise - 12/28/17 0001      Lumbar Exercises: Aerobic   Tread Mill  1.4 mph for 5 min cues to increased hip flexion       Lumbar Exercises: Seated   Other Seated Lumbar Exercises  seated overhead lift red looped band and then again with tight green band       Lumbar Exercises: Prone   Single Arm Raise  Right;Left;10 reps    Single Arm Raise Weights (lbs)  unable without leaning     Other Prone Lumbar Exercises  over 2 pillows , prone pelvic press series x 10 each : hip extension 2 ways       Knee/Hip Exercises: Standing   Hip Flexion  Stengthening;Both;3 sets    Hip Flexion Limitations  with no resistance, red looped band and small range from high cone up     Hip Abduction  Stengthening;Both;2 sets;10 reps    Hip Extension  Stengthening;Both;2 sets;10 reps    Forward Step Up  Both;1 set;20 reps;Hand Hold: 2;Step Height: 4";Step  Height: 6"    Forward Step Up Limitations  Rt leg needed small step , cues for alignment       Moist Heat Therapy   Number Minutes Moist Heat  10 Minutes    Moist Heat Location  Lumbar Spine                  PT Long Term Goals - 12/21/17 1059      PT LONG TERM GOAL #1   Title  Pt will be I with HEP for strength, posture    Status  On-going      PT LONG TERM GOAL #2   Title  Pt will be able to improve her posture in her home, demonstrating good lifting mechanics for housework.     Status  On-going      PT LONG TERM GOAL #3   Title  Pt will be able to walk/shop for 20-30 min in the community without buggy, less fatigue in low back.     Status  On-going      PT LONG TERM GOAL #4   Title  Pt will be able to increase hip  strength to 4/5 overall or greater (abduction, extension) for improved gait, stability     Baseline  3+/5 to 4/5      PT LONG TERM GOAL #5   Title  FOTO score will improve by 10% ore more showing improved functional mobility.     Status  On-going            Plan - 12/28/17 1117    Clinical Impression Statement  Worked on balancing out posture by targeting upper back, shoulder strength and hip extension.  Continues to need external "push" to stand erect while walking.  Excellent form demonstrated  in standing wiyth appropriate hip fatigue.      PT Treatment/Interventions  ADLs/Self Care Home Management;Moist Heat;Therapeutic activities;Therapeutic exercise;Patient/family education;Balance training;Functional mobility training;Neuromuscular re-education;Manual techniques    PT Next Visit Plan  FOTO upcoming , renewal with time off 2nd week Dec. Reformer again? Cont prone/posterior chain     PT Home Exercise Plan  hip abduction, bridge, standing hip flexor hip flexor stretch in prone/ supine, sit to stand.  UE wall slide with hip extension, row, ext and bird dog     Consulted and Agree with Plan of Care  Patient       Patient will benefit from  skilled therapeutic intervention in order to improve the following deficits and impairments:  Decreased strength, Increased fascial restricitons, Impaired flexibility, Postural dysfunction, Pain, Improper body mechanics, Difficulty walking, Increased muscle spasms, Decreased mobility  Visit Diagnosis: Abnormal posture  Muscle weakness (generalized)  Chronic midline low back pain without sciatica     Problem List There are no active problems to display for this patient.   Toni Medina 12/28/2017, 12:41 PM  [place], [name], [address] Phone: [phone]   Fax:  [phone]  Name: Toni Medina MRN: [id number] Date of Birth: December 21, 1931   [name], PT 12/28/17 12:41 PM Phone: [phone] Fax: [phone]

## 2017-12-30 ENCOUNTER — Ambulatory Visit: Payer: Medicare Other | Admitting: Physical Therapy

## 2018-01-04 ENCOUNTER — Ambulatory Visit (HOSPITAL_BASED_OUTPATIENT_CLINIC_OR_DEPARTMENT_OTHER)
Admission: RE | Admit: 2018-01-04 | Discharge: 2018-01-04 | Disposition: A | Discharge status: Home / Self Care | Payer: Medicare Other | Source: Ambulatory Visit | Attending: Internal Medicine | Admitting: Internal Medicine

## 2018-01-04 ENCOUNTER — Encounter (HOSPITAL_COMMUNITY): Payer: Self-pay | Admitting: Internal Medicine

## 2018-01-04 ENCOUNTER — Ambulatory Visit (HOSPITAL_COMMUNITY)
Admission: RE | Admit: 2018-01-04 | Discharge: 2018-01-04 | Disposition: A | Discharge status: Home / Self Care | Payer: Medicare Other | Source: Ambulatory Visit | Attending: Internal Medicine | Admitting: Internal Medicine

## 2018-01-04 VITALS — BP 138/72 | HR 70 | Wt 129.2 lb

## 2018-01-04 DIAGNOSIS — R002 Palpitations: Secondary | ICD-10-CM

## 2018-01-04 DIAGNOSIS — Z79899 Other long term (current) drug therapy: Secondary | ICD-10-CM | POA: Insufficient documentation

## 2018-01-04 DIAGNOSIS — E039 Hypothyroidism, unspecified: Secondary | ICD-10-CM | POA: Diagnosis not present

## 2018-01-04 DIAGNOSIS — Z8249 Family history of ischemic heart disease and other diseases of the circulatory system: Secondary | ICD-10-CM | POA: Insufficient documentation

## 2018-01-04 DIAGNOSIS — Z791 Long term (current) use of non-steroidal anti-inflammatories (NSAID): Secondary | ICD-10-CM | POA: Insufficient documentation

## 2018-01-04 DIAGNOSIS — Z881 Allergy status to other antibiotic agents status: Secondary | ICD-10-CM | POA: Diagnosis not present

## 2018-01-04 DIAGNOSIS — R079 Chest pain, unspecified: Secondary | ICD-10-CM | POA: Insufficient documentation

## 2018-01-04 DIAGNOSIS — I471 Supraventricular tachycardia: Secondary | ICD-10-CM | POA: Diagnosis not present

## 2018-01-04 NOTE — Progress Notes (Signed)
  Echocardiogram 2D Echocardiogram has been performed.  Ardel Jagger G Patrina Andreas 01/04/2018, 10:55 AM

## 2018-01-04 NOTE — Patient Instructions (Addendum)
Your physician recommends that you schedule a follow-up appointment in: 6 months, please call in April to schedule your follow up.

## 2018-01-04 NOTE — Progress Notes (Signed)
Advanced Heart Failure Clinic Note   PCP: Seward Carol, MD PCP-Cardiologist: No primary care provider on file.   HPI:  Toni Medina is a 84 y.o. female with h/o of hypothyroidism and palpitations.  She presents today for regular follow up. No problems since last visit. No further palpitations since the weather has cooled down. She denies SOB with ADLs or exertion, again, since the weather has cooled down. She denies lightheadedness or dizziness. Both of her parents passed away "suddenly" from "heart attacks" but they were 39 (mother) and 29 (father) at time of death. 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   Echo today shows LVEF 60-65%, Grade 1 DD, RV OK, Mild calcified aortic valve, trivial posterior pericardial effusion.  Last visit Zio patch placed which showed six brief runs of SVT, fastest interval lasting 6 beats with max HR of 148 bpm. Longest 6 beats with rate of 103 bpm. Predominantly NSR.   Review of systems complete and found to be negative unless listed in HPI.    No past medical history on file.  Current Outpatient Medications  Medication Sig Dispense Refill  . cholecalciferol (VITAMIN D) 1000 units tablet Take 1,000 Units by mouth daily.    Marland Kitchen levothyroxine (SYNTHROID, LEVOTHROID) 75 MCG tablet Take 75 mcg by mouth daily before breakfast.    . meloxicam (MOBIC) 15 MG tablet Take 15 mg by mouth daily.    . Multiple Vitamin (MULTIVITAMIN) tablet Take 1 tablet by mouth daily.     No current facility-administered medications for this encounter.    Allergies  Allergen Reactions  . Doxycycline Rash    Rash on arms    Social History   Socioeconomic History  . Marital status: Married    Spouse name: Not on file  . Number of children: Not on file  . Years of education: Not on file  . Highest education level: Not on file  Occupational History  . Not on file  Social Needs  . Financial resource strain: Not on  file  . Food insecurity:    Worry: Not on file    Inability: Not on file  . Transportation needs:    Medical: Not on file    Non-medical: Not on file  Tobacco Use  . Smoking status: Not on file  Substance and Sexual Activity  . Alcohol use: Not on file  . Drug use: Not on file  . Sexual activity: Not on file  Lifestyle  . Physical activity:    Days per week: Not on file    Minutes per session: Not on file  . Stress: Not on file  Relationships  . Social connections:    Talks on phone: Not on file    Gets together: Not on file    Attends religious service: Not on file    Active member of club or organization: Not on file    Attends meetings of clubs or organizations: Not on file    Relationship status: Not on file  . Intimate partner violence:    Fear of current or ex partner: Not on file    Emotionally abused: Not on file    Physically abused: Not on file    Forced sexual activity: Not on file  Other Topics Concern  . Not on file  Social History Narrative  . Not on file     Family History  Problem Relation Age of Onset  .  CAD Mother 79  . CAD Father 7    Vitals:   01/04/18 1104  BP: 138/72  Pulse: 70  SpO2: 96%  Weight: 58.6 kg (129 lb 3.2 oz)   PHYSICAL EXAM: General:  Elderly. Well appearing. No respiratory difficulty HEENT: normal Neck: supple. no JVD. Carotids 2+ bilat; no bruits. No lymphadenopathy or thyromegaly appreciated. Cor: PMI nondisplaced. Regular rate & rhythm. No rubs, gallops or murmurs. Lungs: clear Abdomen: soft, nontender, nondistended. No hepatosplenomegaly. No bruits or masses. Good bowel sounds. Extremities: no cyanosis, clubbing, or rash. Trace ankle edema Neuro: alert & oriented x 3, cranial nerves grossly intact. moves all 4 extremities w/o difficulty. Affect very pleasant.  ECG: 11/05/17 NSR at 73 bpm. PR interval 146 ms, QRS 88 ms. Personally reviewed.   ASSESSMENT & PLAN:  1. Palpitations - Zio patch 11/2017 showed six brief  runs of SVT, fastest interval lasting 6 beats with max HR of 148 bpm. Longest 6 beats with rate of 103 bpm. Otherwise NSR.  - Echo today shows LVEF 60-65%, Grade 1 DD, RV OK, Mild calcified aortic valve, trivial posterior pericardial effusion. Personally reviewed by Dr. Haroldine Laws.   Shirley Friar, PA-C 01/04/18   Patient seen and examined with the above-signed Advanced Practice Provider and/or Housestaff. I personally reviewed laboratory data, imaging studies and relevant notes. I independently examined the patient and formulated the important aspects of the plan. I have edited the note to reflect any of my changes or salient points. I have personally discussed the plan with the patient and/or family.  She is doing very well. No exertional cardiac symptoms. Echo and Zio Patch results reviewed with her and her daughter. She had several brief runs of SVT which were asymptomatic. No AF. Echo with normal function and mild MR. We discussed possibility of coronary calcium screening but given her age, lack of symptoms and normal echo would not purse at this time. Ecourage her to continue with Pilates, PT and walking.   Glori Bickers, MD  12:14 PM

## 2018-01-19 ENCOUNTER — Encounter: Payer: Self-pay | Admitting: Physical Therapy

## 2018-01-19 ENCOUNTER — Ambulatory Visit: Payer: Medicare Other | Attending: Orthopedic Surgery | Admitting: Physical Therapy

## 2018-01-19 DIAGNOSIS — M6281 Muscle weakness (generalized): Secondary | ICD-10-CM | POA: Insufficient documentation

## 2018-01-19 DIAGNOSIS — M545 Low back pain, unspecified: Secondary | ICD-10-CM

## 2018-01-19 DIAGNOSIS — R293 Abnormal posture: Secondary | ICD-10-CM | POA: Insufficient documentation

## 2018-01-19 DIAGNOSIS — G8929 Other chronic pain: Secondary | ICD-10-CM | POA: Insufficient documentation

## 2018-01-19 NOTE — Therapy (Addendum)
[place], [name], [address] Phone: [phone]   Fax:  [phone]  Physical Therapy Treatment/Renewal  Patient Details  Name: Toni Medina MRN: [id number] Date of Birth: Sep 07, 1931 Referring Provider (PT): Dr. Latanya Maudlin    Progress Note  Reporting Period 11/23/17 to 01/19/18  See note below for Objective Data and Assessment of Progress/Goals.    Encounter Date: 01/19/2018  PT End of Session - 01/19/18 1015    Visit Number  9    Number of Visits  17    Date for PT Re-Evaluation  02/19/18    PT Start Time  0935    PT Stop Time  1024    PT Time Calculation (min)  49 min    Activity Tolerance  Patient tolerated treatment well    Behavior During Therapy  [facility] for tasks assessed/performed       History reviewed. No pertinent past medical history.  Past Surgical History:  Procedure Laterality Date  . BREAST EXCISIONAL BIOPSY Left 2014    There were no vitals filed for this visit.  Subjective Assessment - 01/19/18 0938    Subjective  has not been here in about 3 weeks.  I have found that 2 Excederin really helps.  Has been walking alot, was in [place].      Currently in Pain?  No/denies         [facility] PT Assessment - 01/19/18 0001      Assessment   Medical Diagnosis  lumbar DDD    Referring Provider (PT)  Dr. Latanya Maudlin     Onset Date/Surgical Date  --   chronic    Next MD Visit  as needed     Prior Therapy  no but has done my Pilates class       Precautions   Precautions  None      Restrictions   Weight Bearing Restrictions  No      Balance Screen   Has the patient fallen in the past 6 months  No    Has the patient had a decrease in activity level because of a fear of falling?   No    Is the patient reluctant to leave their home because of a fear of falling?   No      Home Social worker  Private residence    Living Arrangements  Alone    Available  Help at Discharge  --   daugher in [employer] to enter    Entrance Stairs-Number of Steps  [place]  One level    Additional Comments  has a buggy (not a walker) that her daughter gave her       Cognition   Overall Cognitive Status  Within Functional Limits for tasks assessed      Observation/Other Assessments   Focus on Therapeutic Outcomes (FOTO)   55%      Sensation   Light Touch  Appears Intact      AROM   Lumbar Flexion  WFL stiff     Lumbar Extension  WFL discomfort     Lumbar - Right Side Bend  WFL stiff     Lumbar - Left Side Bend  WFL more difficult     Lumbar - Right Rotation  [facility]    Lumbar - Left  Rotation   WFL stiff       Strength   Right Hip Flexion  4/5    Right Hip Extension  3+/5    Right Hip ABduction  3+/5    Left Hip Flexion  4/5    Left Hip Extension  3+/5    Left Hip ABduction  3+/5    Right Knee Flexion  4+/5    Right Knee Extension  5/5    Left Knee Flexion  4+/5    Left Knee Extension  4+/5    Right Ankle Dorsiflexion  5/5      Flexibility   Quadriceps  tight!          [facility] Adult PT Treatment/Exercise - 01/19/18 0001      Lumbar Exercises: Stretches   Active Hamstring Stretch  2 reps;30 seconds    Quad Stretch  3 reps    Sports administrator Limitations  30 sec each side x 2 methods    Other Lumbar Stretch Exercise  prone knee flexion for quads x 2 each side       Lumbar Exercises: Sidelying   Hip Abduction  Both;10 reps      Lumbar Exercises: Prone   Straight Leg Raise  10 reps      Knee/Hip Exercises: Stretches   Hip Flexor Stretch  Both;2 reps    Hip Flexor Stretch Limitations  standing lunge       Knee/Hip Exercises: Sidelying   Hip ABduction  Strengthening;Both;1 set;10 reps         PT Long Term Goals - 01/19/18 0939      PT LONG TERM GOAL #1   Title  Pt will be I with HEP for strength, posture    Status  On-going      PT LONG TERM GOAL  #2   Title  Pt will be able to improve her posture in her home, demonstrating good lifting mechanics for housework.     Status  On-going      PT LONG TERM GOAL #3   Title  Pt will be able to walk/shop for 20-30 min in the community without buggy, less fatigue in low back.     Baseline  she has to push to stay upright.  Back pain increases to 5/10.      Status  On-going      PT LONG TERM GOAL #4   Title  Pt will be able to increase hip strength to 4/5 overall or greater (abduction, extension) for improved gait, stability     Status  On-going      PT LONG TERM GOAL #5   Title  FOTO score will improve by 10% ore more showing improved functional mobility.     Status  On-going            Plan - 01/19/18 0948    Clinical Impression Statement  Pt renewed for another 4 weeks of PT.  She has been out of town and reports only doing her HEP about 1 time per week.  She contoinues to have tightness in anterior hip, weakness in posterior chain.  Limited in how long she can stand, walk upright in her home and in the community.     PT Frequency  2x / week    PT Duration  4 weeks    PT Treatment/Interventions  ADLs/Self Care Home Management;Moist Heat;Therapeutic activities;Therapeutic exercise;Patient/family education;Balance training;Functional mobility training;Neuromuscular re-education;Manual techniques    PT Next Visit Plan  Cont prone/posterior chain  PT Home Exercise Plan  hip abduction, bridge, standing hip flexor hip flexor stretch in prone/ supine, sit to stand.  UE wall slide with hip extension, row, ext and bird dog     Consulted and Agree with Plan of Care  Patient       Patient will benefit from skilled therapeutic intervention in order to improve the following deficits and impairments:  Decreased strength, Increased fascial restricitons, Impaired flexibility, Postural dysfunction, Pain, Improper body mechanics, Difficulty walking, Increased muscle spasms, Decreased  mobility  Visit Diagnosis: Abnormal posture  Muscle weakness (generalized)  Chronic midline low back pain without sciatica     Problem List There are no active problems to display for this patient.   Jeremey Bascom 01/19/2018, 12:51 PM  [facility] [address] [place], [name], [address] Phone: [phone]   Fax:  [phone]  Name: Toni Medina MRN: [id number] Date of Birth: [birth year]  [name], PT 01/19/18 12:51 PM Phone: [phone] Fax: [phone]

## 2018-01-21 ENCOUNTER — Ambulatory Visit: Payer: Medicare Other | Admitting: Physical Therapy

## 2018-01-21 DIAGNOSIS — M545 Low back pain, unspecified: Secondary | ICD-10-CM

## 2018-01-21 DIAGNOSIS — G8929 Other chronic pain: Secondary | ICD-10-CM

## 2018-01-21 DIAGNOSIS — M6281 Muscle weakness (generalized): Secondary | ICD-10-CM

## 2018-01-21 DIAGNOSIS — R293 Abnormal posture: Secondary | ICD-10-CM

## 2018-01-21 NOTE — Therapy (Signed)
[place], [name], [address] Phone: [phone]   Fax:  [phone]  Physical Therapy Treatment  Patient Details  Name: Toni Medina MRN: [id number] Date of Birth: Feb 16, 1931 Referring Provider (PT): Dr. Latanya Maudlin    Encounter Date: 01/21/2018  PT End of Session - 01/21/18 1112    Visit Number  10    Number of Visits  17    Date for PT Re-Evaluation  02/19/18    PT Start Time  1109    PT Stop Time  1157    PT Time Calculation (min)  48 min    Activity Tolerance  Patient tolerated treatment well    Behavior During Therapy  [facility] for tasks assessed/performed       No past medical history on file.  Past Surgical History:  Procedure Laterality Date  . BREAST EXCISIONAL BIOPSY Left 2014    There were no vitals filed for this visit.  Subjective Assessment - 01/21/18 1110    Subjective  Right now, its a 2/10.  Yesterday I was chasing pain.  Was standing alot looking to see what they were doing outside my house.  A water main broke.      Currently in Pain?  Yes    Pain Score  2     Pain Location  Back    Pain Orientation  Lower    Pain Descriptors / Indicators  Discomfort    Pain Type  Chronic pain    Pain Onset  More than a month ago    Pain Frequency  Intermittent    Aggravating Factors   standing     Pain Relieving Factors  rest, sitting    Effect of Pain on Daily Activities  limits her walking               [facility] Adult PT Treatment/Exercise - 01/21/18 0001      Self-Care   Other Self-Care Comments   posture, posterior chain , HEP       Lumbar Exercises: Standing   Other Standing Lumbar Exercises  wall exercises : horizontal pull yellow x 10, narrow grip flexion x 10 unable to maintain fully      Lumbar Exercises: Seated   Sit to Stand  10 reps    Sit to Stand Limitations  multiple reps, holding magic circle in shoulder flexion, mod to max cues for hip hinge       Lumbar  Exercises: Supine   Bridge  15 reps    Bridge Limitations  on ball     Straight Leg Raise  10 reps    Straight Leg Raises Limitations  from ball     Other Supine Lumbar Exercises  hamstring curl with ball x 10       Knee/Hip Exercises: Standing   Functional Squat  2 sets;10 reps    Functional Squat Limitations  5 lbs kettle bell , max cues for hip hinge     Other Standing Knee Exercises  shoulder 5 lbs upright row cue for posture    x 10      Knee/Hip Exercises: Prone   Hip Extension  Strengthening;Both;1 set;10 reps    Hip Extension Limitations  done 1 set knee ext an1 set knee flexed for glutes     Other Prone Exercises  prone over ball for above     Other Prone Exercises  UE lift x 10 in plank position over ball  Moist Heat Therapy   Number Minutes Moist Heat  10 Minutes    Moist Heat Location  Lumbar Spine                  PT Long Term Goals - 01/19/18 0939      PT LONG TERM GOAL #1   Title  Pt will be I with HEP for strength, posture    Status  On-going      PT LONG TERM GOAL #2   Title  Pt will be able to improve her posture in her home, demonstrating good lifting mechanics for housework.     Status  On-going      PT LONG TERM GOAL #3   Title  Pt will be able to walk/shop for 20-30 min in the community without buggy, less fatigue in low back.     Baseline  she has to push to stay upright.  Back pain increases to 5/10.      Status  On-going      PT LONG TERM GOAL #4   Title  Pt will be able to increase hip strength to 4/5 overall or greater (abduction, extension) for improved gait, stability     Status  On-going      PT LONG TERM GOAL #5   Title  FOTO score will improve by 10% ore more showing improved functional mobility.     Status  On-going            Plan - 01/21/18 1313    Clinical Impression Statement  Patient running late today.  Spent much of the time addressing hip hinge and upright posture for optimal body mechanics.  No increased  pain but appreciates the challenge of using the wall as feedback for posture.     PT Treatment/Interventions  ADLs/Self Care Home Management;Moist Heat;Therapeutic activities;Therapeutic exercise;Patient/family education;Balance training;Functional mobility training;Neuromuscular re-education;Manual techniques    PT Next Visit Plan  Cont prone/posterior chain     PT Home Exercise Plan  hip abduction, bridge, standing hip flexor hip flexor stretch in prone/ supine, sit to stand.  UE wall slide with hip extension, row, ext and bird dog     Consulted and Agree with Plan of Care  Patient       Patient will benefit from skilled therapeutic intervention in order to improve the following deficits and impairments:  Decreased strength, Increased fascial restricitons, Impaired flexibility, Postural dysfunction, Pain, Improper body mechanics, Difficulty walking, Increased muscle spasms, Decreased mobility  Visit Diagnosis: Abnormal posture  Muscle weakness (generalized)  Chronic midline low back pain without sciatica     Problem List There are no active problems to display for this patient.   , 01/21/2018, 1:14 PM  [facility] [address] [place], [name], [address] Phone: [phone]   Fax:  [phone]  Name: Toni Medina MRN: [id number] Date of Birth: September 25, 1931   [name], PT 01/21/18 1:14 PM Phone: [phone] Fax: [phone]

## 2018-01-21 NOTE — Patient Instructions (Signed)
Step 1  Step 2  Standing with Back Flat Against Wall reps: 5  sets: 1  hold: 30  daily: 1  weekly: 7 Setup  Begin in a standing upright position with your heels and shoulders against a wall, and your back flat against the wall. Movement  Hold this position. Tip  Make sure to continue breathing evenly during the exercise. Step 1  Step 2  Standing Overhead Press at [employer] reps: 10  sets: 2  hold: 5  daily: 1  weekly: 7 Setup  Begin in a standing upright position with your back against a wall and your arms bent out to your sides, palms facing forward. Movement  Slowly press your arms straight overhead, then lower them back to the starting position, and repeat. Tip  Make sure to keep your back against the wall and do not shrug your shoulders during the exercise. Step 1  Step 2  Standing Shoulder Horizontal Abduction with Resistance reps: 10  sets: 2  hold: 5  daily: 1  weekly: 7 Setup  Begin in a standing position holding a resistance band in each hand. Lift your arms straight in front of your body with both fists facing inward. Movement  Pull your hands apart until they are directly to your sides, then return to the starting position and repeat. Tip  Make sure to keep your arms level and think of squeezing your shoulder blades together as you pull the band. Maintain good posture during the exercise and avoid shrugging your shoulders.

## 2018-02-09 ENCOUNTER — Encounter: Payer: Self-pay | Admitting: Physical Therapy

## 2018-02-09 ENCOUNTER — Ambulatory Visit: Payer: Medicare Other | Attending: Orthopedic Surgery | Admitting: Physical Therapy

## 2018-02-09 DIAGNOSIS — G8929 Other chronic pain: Secondary | ICD-10-CM | POA: Insufficient documentation

## 2018-02-09 DIAGNOSIS — M545 Low back pain, unspecified: Secondary | ICD-10-CM

## 2018-02-09 DIAGNOSIS — R293 Abnormal posture: Secondary | ICD-10-CM | POA: Insufficient documentation

## 2018-02-09 DIAGNOSIS — M6281 Muscle weakness (generalized): Secondary | ICD-10-CM | POA: Diagnosis not present

## 2018-02-09 NOTE — Therapy (Signed)
[place], [name], [address] Phone: [phone]   Fax:  [phone]  Physical Therapy Treatment  Patient Details  Name: Toni Medina MRN: [id number] Date of Birth: Jun 12, 1931 Referring Provider (PT): Dr. Latanya Maudlin    Encounter Date: 02/09/2018  PT End of Session - 02/09/18 1251    Visit Number  11    Number of Visits  17    Date for PT Re-Evaluation  02/19/18    PT Start Time  3382    PT Stop Time  5053    PT Time Calculation (min)  40 min    Activity Tolerance  Patient tolerated treatment well    Behavior During Therapy  [facility] for tasks assessed/performed       History reviewed. No pertinent past medical history.  Past Surgical History:  Procedure Laterality Date  . BREAST EXCISIONAL BIOPSY Left 2014    There were no vitals filed for this visit.  Subjective Assessment - 02/09/18 1154    Subjective  Was very busy over the holidays.  I had to take Excederin a couple days.      Currently in Pain?  No/denies            [facility] Adult PT Treatment/Exercise - 02/09/18 0001      Lumbar Exercises: Stretches   Hip Flexor Stretch  Right;Left;3 reps;30 seconds    Hip Flexor Stretch Limitations  PT assist with gentle knee flexion to increase stretch       Lumbar Exercises: Standing   Other Standing Lumbar Exercises  overhead reach x 10, horzontal pull green band x 10 against wall       Lumbar Exercises: Supine   Clam Limitations  green band clam x 15     Bridge  10 reps    Bridge with [employer]  10 reps    Bridge with clamshell  10 reps    Bridge with [employer] Limitations  iso hold parallel thigh green band       Lumbar Exercises: Sidelying   Clam  Both;15 reps    Clam Limitations  green band     Hip Abduction  Both;10 reps    Hip Abduction Weights (lbs)  green band knee bent x 10 and then knee ext x 10                   PT Long Term Goals - 02/09/18 1213      PT  LONG TERM GOAL #1   Title  Pt will be I with HEP for strength, posture    Status  On-going      PT LONG TERM GOAL #2   Title  Pt will be able to improve her posture in her home, demonstrating good lifting mechanics for housework.     Status  On-going      PT LONG TERM GOAL #3   Title  Pt will be able to walk/shop for 20-30 min in the community without buggy, less fatigue in low back.     Baseline  back pain has increased to 6/10 with walking and being on her feet     Status  On-going      PT LONG TERM GOAL #4   Title  Pt will be able to increase hip strength to 4/5 overall or greater (abduction, extension) for improved gait, stability     Baseline  3+/5 to 4/5    Status  On-going  PT LONG TERM GOAL #5   Title  FOTO score will improve by 10% or more showing improved functional mobility.     Status  On-going            Plan - 02/09/18 1216    Clinical Impression Statement  Patient did well today, increasing ROM and height in hip extension during bridge. .  Still limited by posterior chain weakness, quad, hip flexor tightness.  Motivated to improve her ability to walk longer distances more upright.  No pain post.     PT Treatment/Interventions  ADLs/Self Care Home Management;Moist Heat;Therapeutic activities;Therapeutic exercise;Patient/family education;Balance training;Functional mobility training;Neuromuscular re-education;Manual techniques    PT Next Visit Plan  Cont prone/posterior chain     PT Home Exercise Plan  hip abduction, bridge, standing hip flexor hip flexor stretch in prone/ supine, sit to stand.  UE wall slide with hip extension, row, ext and bird dog     Consulted and Agree with Plan of Care  Patient       Patient will benefit from skilled therapeutic intervention in order to improve the following deficits and impairments:  Decreased strength, Increased fascial restricitons, Impaired flexibility, Postural dysfunction, Pain, Improper body mechanics, Difficulty  walking, Increased muscle spasms, Decreased mobility  Visit Diagnosis: Abnormal posture  Muscle weakness (generalized)  Chronic midline low back pain without sciatica     Problem List There are no active problems to display for this patient.   , 02/09/2018, 12:51 PM  [facility] [address] [place], [name], [address] Phone: [phone]   Fax:  [phone]  Name: Toni Medina MRN: [id number] Date of Birth: 15-Sep-1931  [name], PT 02/09/18 12:51 PM Phone: [phone] Fax: [phone]

## 2018-02-11 ENCOUNTER — Encounter: Payer: Self-pay | Admitting: Physical Therapy

## 2018-02-11 ENCOUNTER — Ambulatory Visit: Payer: Medicare Other | Admitting: Physical Therapy

## 2018-02-11 DIAGNOSIS — G8929 Other chronic pain: Secondary | ICD-10-CM

## 2018-02-11 DIAGNOSIS — R293 Abnormal posture: Secondary | ICD-10-CM

## 2018-02-11 DIAGNOSIS — M6281 Muscle weakness (generalized): Secondary | ICD-10-CM | POA: Diagnosis not present

## 2018-02-11 DIAGNOSIS — M545 Low back pain, unspecified: Secondary | ICD-10-CM

## 2018-02-11 NOTE — Therapy (Signed)
[place], [name], [address] Phone: [phone]   Fax:  [phone]  Physical Therapy Treatment  Patient Details  Name: Toni Medina MRN: [id number] Date of Birth: 04-03-1931 Referring Provider (PT): Dr. Latanya Maudlin    Encounter Date: 02/11/2018  PT End of Session - 02/11/18 1400    Visit Number  12    Number of Visits  17    Date for PT Re-Evaluation  02/19/18    PT Start Time  4097    PT Stop Time  1420    PT Time Calculation (min)  45 min    Activity Tolerance  Patient tolerated treatment well    Behavior During Therapy  [facility] for tasks assessed/performed       History reviewed. No pertinent past medical history.  Past Surgical History:  Procedure Laterality Date  . BREAST EXCISIONAL BIOPSY Left 2014    There were no vitals filed for this visit.  Subjective Assessment - 02/11/18 1339    Subjective  Pt reports continuing to stay busy with consistent pain the lower back.     Currently in Pain?  Yes    Pain Score  3     Pain Location  Back    Pain Orientation  Right;Left    Pain Descriptors / Indicators  Discomfort    Pain Type  Chronic pain    Aggravating Factors   standing    Pain Relieving Factors  rest, sitting          [facility] Adult PT Treatment/Exercise - 02/11/18 0001      Lumbar Exercises: Aerobic   UBE (Upper Arm Bike)  3 min forward;backward; L1      Lumbar Exercises: Standing   Row  Strengthening;Both;20 reps;Theraband    Theraband Level (Row)  Level 3 (Green)    Other Standing Lumbar Exercises  overhead reach x 10, horzontal pull green band x 10 against wall     Other Standing Lumbar Exercises  horizontal pull green x 10 and then 10 sec; hold x 2;      Lumbar Exercises: Supine   Bridge Limitations  on ball     Bridge with [employer]  10 reps      Lumbar Exercises: Quadruped   Straight Leg Raise  10 reps    Straight Leg Raises Limitations  x 1 set, knee ext, x 1  set knee bent     Other Quadruped Lumbar Exercises  --      Knee/Hip Exercises: Stretches   Active Hamstring Stretch  Both;1 rep;60 seconds    Active Hamstring Stretch Limitations  increased tension LLE     Gastroc Stretch  Right;Left;2 reps;30 seconds      Shoulder Exercises: Standing   Other Standing Exercises  Star gazer against wall; 10 reps             PT Education - 02/11/18 1421    Education Details  posture, form, supine to sit safely to preserve back    Person(s) Educated  Patient    Methods  Explanation    Comprehension  Verbalized understanding          PT Long Term Goals - 02/09/18 1213      PT LONG TERM GOAL #1   Title  Pt will be I with HEP for strength, posture    Status  On-going      PT LONG TERM GOAL #2   Title  Pt will be able  to improve her posture in her home, demonstrating good lifting mechanics for housework.     Status  On-going      PT LONG TERM GOAL #3   Title  Pt will be able to walk/shop for 20-30 min in the community without buggy, less fatigue in low back.     Baseline  back pain has increased to 6/10 with walking and being on her feet     Status  On-going      PT LONG TERM GOAL #4   Title  Pt will be able to increase hip strength to 4/5 overall or greater (abduction, extension) for improved gait, stability     Baseline  3+/5 to 4/5    Status  On-going      PT LONG TERM GOAL #5   Title  FOTO score will improve by 10% or more showing improved functional mobility.     Status  On-going            Plan - 02/11/18 1423    Clinical Impression Statement  Denajah fatigues quickly with postural correction exercise.  She did not complain of any increased pain back during strengthening.  She continues to benefit from PT for posterior chain strengthening to improve her ability to stand more upright.     PT Treatment/Interventions  ADLs/Self Care Home Management;Moist Heat;Therapeutic activities;Therapeutic exercise;Patient/family  education;Balance training;Functional mobility training;Neuromuscular re-education;Manual techniques    PT Next Visit Plan  Cont prone/posterior chain : standing preferred as tolerated , seated on ball , sit to stand /parallel bars side stepping     PT Home Exercise Plan  hip abduction, bridge, standing hip flexor hip flexor stretch in prone/ supine, sit to stand.  UE wall slide with hip extension, row, ext and bird dog , standing calf    Consulted and Agree with Plan of Care  Patient       Patient will benefit from skilled therapeutic intervention in order to improve the following deficits and impairments:  Decreased strength, Increased fascial restricitons, Impaired flexibility, Postural dysfunction, Pain, Improper body mechanics, Difficulty walking, Increased muscle spasms, Decreased mobility  Visit Diagnosis: Abnormal posture  Muscle weakness (generalized)  Chronic midline low back pain without sciatica     Problem List There are no active problems to display for this patient.   , 02/11/2018, 2:35 PM  [facility] [address] [name], [name], [address] Phone: [phone]   Fax:  [phone]  Name: Toni Medina MRN: [id number] Date of Birth: 03/07/1931  [name], PT 02/11/18 2:35 PM Phone: [phone] Fax: [phone]

## 2018-02-11 NOTE — Patient Instructions (Signed)
Calf Stretch    Place one leg forward, bent, other leg behind and straight. Lean forward keeping back heel flat. Hold _30___ seconds while counting out loud. Repeat with other leg forward. Repeat 3____ times. Do _1___ sessions per day.  http://gt2.exer.us/478   Copyright  VHI. All rights reserved.   

## 2018-02-16 ENCOUNTER — Ambulatory Visit: Payer: Medicare Other | Admitting: Physical Therapy

## 2018-02-16 ENCOUNTER — Encounter: Payer: Self-pay | Admitting: Physical Therapy

## 2018-02-16 DIAGNOSIS — R293 Abnormal posture: Secondary | ICD-10-CM | POA: Diagnosis not present

## 2018-02-16 DIAGNOSIS — M6281 Muscle weakness (generalized): Secondary | ICD-10-CM | POA: Diagnosis not present

## 2018-02-16 DIAGNOSIS — M545 Low back pain, unspecified: Secondary | ICD-10-CM

## 2018-02-16 DIAGNOSIS — G8929 Other chronic pain: Secondary | ICD-10-CM

## 2018-02-16 NOTE — Therapy (Signed)
[place], [name], [address] Phone: [phone]   Fax:  [phone]  Physical Therapy Treatment  Patient Details  Name: Toni Medina MRN: [id number] Date of Birth: 02-25-1931 Referring Provider (PT): Dr. Latanya Maudlin    Encounter Date: 02/16/2018  PT End of Session - 02/16/18 1023    Visit Number  13    Number of Visits  17    Date for PT Re-Evaluation  02/19/18    PT Start Time  1017    PT Stop Time  1100    PT Time Calculation (min)  43 min    Activity Tolerance  Patient tolerated treatment well    Behavior During Therapy  [facility] for tasks assessed/performed       History reviewed. No pertinent past medical history.  Past Surgical History:  Procedure Laterality Date  . BREAST EXCISIONAL BIOPSY Left 2014    There were no vitals filed for this visit.  Subjective Assessment - 02/16/18 1021    Subjective  1/10 today, got a late start though.      Currently in Pain?  Yes    Pain Score  1     Pain Location  Back    Pain Orientation  Right;Left;Lower    Pain Descriptors / Indicators  Discomfort    Pain Type  Chronic pain    Pain Onset  More than a month ago    Pain Frequency  Intermittent    Aggravating Factors   standing, being up long periods     Pain Relieving Factors  rest, sitting, heat , stretching          [facility] Adult PT Treatment/Exercise - 02/16/18 0001      Lumbar Exercises: Stretches   Active Hamstring Stretch  2 reps;30 seconds      Lumbar Exercises: Seated   Long Arc Quad on [place]  Strengthening;Both;1 set    Hip Flexion on [place]  Strengthening;Both;10 reps    Hip Flexion on Ball Limitations  then arms opposition     Sit to Stand Limitations  trunk rotaion x  5 for balance     Other Seated Lumbar Exercises  diagonal pull red x 10 each arm     Other Seated Lumbar Exercises  seated ball exercises for core, balance : shoulder flexion horizontal pull red x 10, manual cues       Lumbar Exercises: Supine   Heel Slides  15 reps    Heel Slides Limitations  with ball , dig heels     Bridge  15 reps    Bridge Limitations  on ball     Straight Leg Raise  10 reps    Straight Leg Raises Limitations  from ball     Other Supine Lumbar Exercises  bridge with horizontal pull green x 10 (iso bridge)       Lumbar Exercises: Sidelying   Clam  Both;20 reps    Hip Abduction  Both;15 reps    Hip Abduction Weights (lbs)  then add flexion and ext of hip, cues needed       Knee/Hip Exercises: Stretches   Other Knee/Hip Stretches  wide knees hip stretching x 6                   PT Long Term Goals - 02/09/18 1213      PT LONG TERM GOAL #1   Title  Pt will be I with HEP for strength, posture  Status  On-going      PT LONG TERM GOAL #2   Title  Pt will be able to improve her posture in her home, demonstrating good lifting mechanics for housework.     Status  On-going      PT LONG TERM GOAL #3   Title  Pt will be able to walk/shop for 20-30 min in the community without buggy, less fatigue in low back.     Baseline  back pain has increased to 6/10 with walking and being on her feet     Status  On-going      PT LONG TERM GOAL #4   Title  Pt will be able to increase hip strength to 4/5 overall or greater (abduction, extension) for improved gait, stability     Baseline  3+/5 to 4/5    Status  On-going      PT LONG TERM GOAL #5   Title  FOTO score will improve by 10% or more showing improved functional mobility.     Status  On-going            Plan - 02/16/18 1024    Clinical Impression Statement  Used ball for core strength , stability and balance.  Did well with treadmill as she can use her upper body to press into the handles.  Her L foot showed weakness with gait.  No pain increase today with any exercises.     PT Treatment/Interventions  ADLs/Self Care Home Management;Moist Heat;Therapeutic activities;Therapeutic exercise;Patient/family  education;Balance training;Functional mobility training;Neuromuscular re-education;Manual techniques    PT Next Visit Plan  Renewal.  end of month POC Cont prone/posterior chain : standing preferred as tolerated , seated on ball , sit to stand /parallel bars side stepping     PT Home Exercise Plan  hip abduction, bridge, standing hip flexor hip flexor stretch in prone/ supine, sit to stand.  UE wall slide with hip extension, row, ext and bird dog , standing calf    Consulted and Agree with Plan of Care  Patient       Patient will benefit from skilled therapeutic intervention in order to improve the following deficits and impairments:  Decreased strength, Increased fascial restricitons, Impaired flexibility, Postural dysfunction, Pain, Improper body mechanics, Difficulty walking, Increased muscle spasms, Decreased mobility  Visit Diagnosis: Abnormal posture  Muscle weakness (generalized)  Chronic midline low back pain without sciatica     Problem List There are no active problems to display for this patient.   , 02/16/2018, 11:57 AM  [place], [name], [address] Phone: [phone]   Fax:  [phone]  Name: Toni Medina MRN: [id number] Date of Birth: 1931/02/06  [name], PT 02/16/18 11:57 AM Phone: [phone] Fax: [phone]

## 2018-02-18 ENCOUNTER — Encounter: Payer: Self-pay | Admitting: Physical Therapy

## 2018-02-18 ENCOUNTER — Ambulatory Visit: Payer: Medicare Other | Admitting: Physical Therapy

## 2018-02-18 DIAGNOSIS — M6281 Muscle weakness (generalized): Secondary | ICD-10-CM

## 2018-02-18 DIAGNOSIS — M545 Low back pain, unspecified: Secondary | ICD-10-CM

## 2018-02-18 DIAGNOSIS — R293 Abnormal posture: Secondary | ICD-10-CM | POA: Diagnosis not present

## 2018-02-18 DIAGNOSIS — G8929 Other chronic pain: Secondary | ICD-10-CM

## 2018-02-18 NOTE — Patient Instructions (Signed)
Step 1  Step 2  Prone Hip Extension reps: 10  sets: 2  hold: 5  daily: 2  weekly: 7 Setup  Begin lying on your front on a bed or flat surface. Movement  Squeezing your buttock muscles, slightly lift your leg off of the bed. Hold briefly, then relax and repeat. Tip  Make sure to keep your knee straight and do not lift your hip off of the bed during the exercise. Step 1  Step 2  Supine Bridge reps: 10  sets: 2  hold: 5  daily: 2  weekly: 7 Setup  Begin lying on your back with your arms resting at your sides, your legs bent at the knees and your feet flat on the ground. Movement  Tighten your abdominals and slowly lift your hips off the floor into a bridge position, keeping your back straight. Tip  Make sure to keep your trunk stiff throughout the exercise and your arms flat on the floor. Step 1  Step 2  Sidelying Hip Abduction reps: 10  sets: 2  hold: 5  daily: 2  weekly: 7 Setup  Begin lying on your side with your top leg straight and your bottom leg bent. Movement  Lift your top leg up toward the ceiling, then slowly lower it back down and repeat. Tip  Make sure to keep your leg straight and do not let your hips roll backward or forward during the exercise. Step 1  Step 2  Bird Dog reps: 10  sets: 2  hold: 5  daily: 2  weekly: 7 Setup  Begin on all fours, with your arms positioned directly under your shoulders. Movement  Straighten one arm and your opposite leg at the same time, until they are parallel to the floor. Hold briefly, then return to the starting position. Tip  Make sure to keep your abdominals tight and hips level during the exercise. Step 1  Step 2  Seated Hamstring Stretch reps: 3  sets: 1  hold: 30  daily: 1  weekly: 7 Setup  Begin sitting upright with one leg straight forward and your heel resting on the ground. Movement  Bend your trunk forward, hinging at your hips until you feel a stretch in the back of your leg. Hold this position.    Tip  Make sure to keep your knee straight during the stretch and do not let your back arch or slump.

## 2018-02-18 NOTE — Therapy (Signed)
[place], [name], [address] Phone: [phone]   Fax:  [phone]  Physical Therapy Treatment  Patient Details  Name: Toni Medina MRN: [id number] Date of Birth: [birth year] Referring Provider (PT): Dr. Latanya Maudlin    Encounter Date: 02/18/2018  PT End of Session - 02/18/18 1242    Visit Number  14    Number of Visits  17    Date for PT Re-Evaluation  03/18/18    PT Start Time  9449    PT Stop Time  6759    PT Time Calculation (min)  46 min    Activity Tolerance  Patient tolerated treatment well    Behavior During Therapy  [facility] for tasks assessed/performed       History reviewed. No pertinent past medical history.  Past Surgical History:  Procedure Laterality Date  . BREAST EXCISIONAL BIOPSY Left 2014    There were no vitals filed for this visit.  Subjective Assessment - 02/18/18 1158    Subjective  about 2/10.  Can't straighten back as I walk.  I have to stop and do it, can;t maintain.     Currently in Pain?  Yes    Pain Score  2     Pain Location  Back    Pain Orientation  Right;Left;Lower    Pain Descriptors / Indicators  Tiring    Pain Type  Chronic pain    Pain Onset  More than a month ago    Pain Frequency  Intermittent    Aggravating Factors   standing    Pain Relieving Factors  rest, sitting, stretching     Effect of Pain on Daily Activities  limits posture with walking          [facility] PT Assessment - 02/18/18 0001      Assessment   Medical Diagnosis  lumbar DDD    Referring Provider (PT)  Dr. Latanya Maudlin     Onset Date/Surgical Date  --   chronic    Next MD Visit  as needed     Prior Therapy  no but has done my Pilates class       Precautions   Precautions  None      Restrictions   Weight Bearing Restrictions  No      Home Environment   Living Environment  Private residence    Living Arrangements  Alone    Available Help at Discharge  --   [place] in  [place] to enter    Entrance Stairs-Number of Steps  4    Entrance Stairs-Rails  Right    Additional Comments  has a buggy (not a walker) that her daughter gave her       Cognition   Overall Cognitive Status  Within Functional Limits for tasks assessed      Observation/Other Assessments   Focus on Therapeutic Outcomes (FOTO)   53%      Sensation   Light Touch  Appears Intact      Strength   Right Hip Extension  4-/5    Right Hip ABduction  3+/5    Left Hip Extension  4/5    Left Hip ABduction  3+/5        [facility] Adult PT Treatment/Exercise - 02/18/18 0001      Self-Care   Other Self-Care Comments   see education, FOTO, continue vs DC.  Lumbar Exercises: Seated   Other Seated Lumbar Exercises  seated on fit disc: upper trunk rotation, bilateral UE flexion    addressed maintaining core contraction and neutral spine    Other Seated Lumbar Exercises  hinge x 8, added horizontal abduction x 10       Lumbar Exercises: Prone   Straight Leg Raise  10 reps    Opposite Arm/Leg Raise  Right arm/Left leg;Left arm/Right leg;10 reps    Other Prone Lumbar Exercises  scapular retraction x 10 , then goal post lift x 10       Knee/Hip Exercises: Sidelying   Hip ABduction  Strengthening;Both;1 set;15 reps      Shoulder Exercises: ROM/Strengthening   UBE (Upper Arm Bike)  5 min L1       Shoulder Exercises: Stretch   Corner Stretch Limitations  doorway x 3 cues to hold 20 sec              PT Education - 02/18/18 1241    Education Details  posture, concept of strength vs stretched, streamlined HEP     Person(s) Educated  Patient    Methods  Explanation;Handout    Comprehension  Verbalized understanding;Tactile cues required;Verbal cues required          PT Long Term Goals - 02/09/18 1213      PT LONG TERM GOAL #1   Title  Pt will be I with HEP for strength, posture    Status  On-going      PT LONG TERM GOAL #2    Title  Pt will be able to improve her posture in her home, demonstrating good lifting mechanics for housework.     Status  On-going      PT LONG TERM GOAL #3   Title  Pt will be able to walk/shop for 20-30 min in the community without buggy, less fatigue in low back.     Baseline  back pain has increased to 6/10 with walking and being on her feet     Status  On-going      PT LONG TERM GOAL #4   Title  Pt will be able to increase hip strength to 4/5 overall or greater (abduction, extension) for improved gait, stability     Baseline  3+/5 to 4/5    Status  On-going      PT LONG TERM GOAL #5   Title  FOTO score will improve by 10% or more showing improved functional mobility.     Status  On-going            Plan - 02/18/18 1201    Clinical Impression Statement  Pt continues to need eduation about posture, difference of strength and purpose of stretching. She was renewed for more visits.  Her FOTO score has not improved  since eval.  She continues to be limited in her ability to walk even 1 block while maintaining upright posture.  She needs 1:1 for correction of technique.  Will give her up to 4 more weeks of PT but will need to show improvement in function.      PT Treatment/Interventions  ADLs/Self Care Home Management;Moist Heat;Therapeutic activities;Therapeutic exercise;Patient/family education;Balance training;Functional mobility training;Neuromuscular re-education;Manual techniques    PT Next Visit Plan  Renewal.  end of month POC Cont prone/posterior chain : standing preferred as tolerated , seated on ball , sit to stand /parallel bars side stepping     PT Home Exercise Plan  hip abduction, bridge, standing hip  flexor hip flexor stretch in prone/ supine, sit to stand.  UE wall slide with hip extension, row, ext and bird dog , standing calf    Consulted and Agree with Plan of Care  Patient       Patient will benefit from skilled therapeutic intervention in order to improve the  following deficits and impairments:  Decreased strength, Increased fascial restricitons, Impaired flexibility, Postural dysfunction, Pain, Improper body mechanics, Difficulty walking, Increased muscle spasms, Decreased mobility  Visit Diagnosis: Abnormal posture  Muscle weakness (generalized)  Chronic midline low back pain without sciatica     Problem List There are no active problems to display for this patient.   Jaquille Kau 02/18/2018, 12:58 PM  [facility] [address] [place], [name], [address] Phone: [phone]   Fax:  [phone]  Name: Toni Medina MRN: [id number] Date of Birth: [birth year]  [name], PT 02/18/18 12:58 PM Phone: [phone] Fax: [phone]

## 2018-02-23 ENCOUNTER — Ambulatory Visit: Payer: Medicare Other | Admitting: Physical Therapy

## 2018-02-23 DIAGNOSIS — M545 Low back pain, unspecified: Secondary | ICD-10-CM

## 2018-02-23 DIAGNOSIS — R293 Abnormal posture: Secondary | ICD-10-CM

## 2018-02-23 DIAGNOSIS — G8929 Other chronic pain: Secondary | ICD-10-CM | POA: Diagnosis not present

## 2018-02-23 DIAGNOSIS — M6281 Muscle weakness (generalized): Secondary | ICD-10-CM | POA: Diagnosis not present

## 2018-02-23 NOTE — Therapy (Signed)
[place], [name], [address] Phone: [phone]   Fax:  [phone]  Physical Therapy Treatment  Patient Details  Name: Toni Medina MRN: [id number] Date of Birth: 03-21-1931 Referring Provider (PT): Dr. Latanya Maudlin    Encounter Date: 02/23/2018  PT End of Session - 02/23/18 1424    Visit Number  15    Number of Visits  17    Date for PT Re-Evaluation  03/18/18    PT Start Time  1419    PT Stop Time  1502    PT Time Calculation (min)  43 min    Activity Tolerance  Patient tolerated treatment well    Behavior During Therapy  [facility] for tasks assessed/performed       No past medical history on file.  Past Surgical History:  Procedure Laterality Date  . BREAST EXCISIONAL BIOPSY Left 2014    There were no vitals filed for this visit.      [place] Adult PT Treatment/Exercise - 02/23/18 0001      Ambulation/Gait   Gait Comments  parallel bars: step over hurdle to increase hip knee and ankle flexion : walking and then single leg pickup x 15 each L       Lumbar Exercises: Aerobic   Tread Mill  1.5 mph for 8 min       Lumbar Exercises: Standing   Other Standing Lumbar Exercises  sidestepping mini squat redband x 8 passes in parallel bars , min rest       Lumbar Exercises: Seated   Sit to Stand  15 reps    Sit to Stand Limitations  red band used UE       Lumbar Exercises: Sidelying   Clam  Both;20 reps    Hip Abduction  Both;15 reps      Knee/Hip Exercises: Stretches   Piriformis Stretch  3 reps    Piriformis Stretch Limitations  seated       Knee/Hip Exercises: Sidelying   Hip ABduction  Strengthening;Both;1 set;15 reps    Clams  x 20 red band                   PT Long Term Goals - 02/09/18 1213      PT LONG TERM GOAL #1   Title  Pt will be I with HEP for strength, posture    Status  On-going      PT LONG TERM GOAL #2   Title  Pt will be able to improve her posture in  her home, demonstrating good lifting mechanics for housework.     Status  On-going      PT LONG TERM GOAL #3   Title  Pt will be able to walk/shop for 20-30 min in the community without buggy, less fatigue in low back.     Baseline  back pain has increased to 6/10 with walking and being on her feet     Status  On-going      PT LONG TERM GOAL #4   Title  Pt will be able to increase hip strength to 4/5 overall or greater (abduction, extension) for improved gait, stability     Baseline  3+/5 to 4/5    Status  On-going      PT LONG TERM GOAL #5   Title  FOTO score will improve by 10% or more showing improved functional mobility.     Status  On-going  Plan - 02/23/18 1423    Clinical Impression Statement  Pt continued to work on upright posture, used mirror for visual feedback.  No complaint of pain today with exercises.  Stood for up to 30 min of session  for hip strengthening, posture.  Able to correct for L ankle DF on treadmill better today.      PT Treatment/Interventions  ADLs/Self Care Home Management;Moist Heat;Therapeutic activities;Therapeutic exercise;Patient/family education;Balance training;Functional mobility training;Neuromuscular re-education;Manual techniques    PT Next Visit Plan  Cont prone/posterior chain : standing preferred as tolerated , seated on ball , sit to stand /parallel bars side stepping     PT Home Exercise Plan  hip abduction, bridge, standing hip flexor hip flexor stretch in prone/ supine, sit to stand.  UE wall slide with hip extension, row, ext and bird dog , standing calf    Consulted and Agree with Plan of Care  Patient       Patient will benefit from skilled therapeutic intervention in order to improve the following deficits and impairments:  Decreased strength, Increased fascial restricitons, Impaired flexibility, Postural dysfunction, Pain, Improper body mechanics, Difficulty walking, Increased muscle spasms, Decreased mobility  Visit  Diagnosis: Abnormal posture  Muscle weakness (generalized)  Chronic midline low back pain without sciatica     Problem List There are no active problems to display for this patient.   PAA,JENNIFER 02/23/2018, 5:01 PM  [facility] [address] [place], [name], [address] Phone: [phone]   Fax:  [phone]  Name: Toni Medina MRN: [id number] Date of Birth: February 27, 1931   [name], PT 02/23/18 5:01 PM Phone: [phone] Fax: [phone]

## 2018-02-25 ENCOUNTER — Ambulatory Visit: Payer: Medicare Other | Admitting: Physical Therapy

## 2018-02-25 DIAGNOSIS — R293 Abnormal posture: Secondary | ICD-10-CM | POA: Diagnosis not present

## 2018-02-25 DIAGNOSIS — M545 Low back pain, unspecified: Secondary | ICD-10-CM

## 2018-02-25 DIAGNOSIS — M6281 Muscle weakness (generalized): Secondary | ICD-10-CM | POA: Diagnosis not present

## 2018-02-25 DIAGNOSIS — G8929 Other chronic pain: Secondary | ICD-10-CM | POA: Diagnosis not present

## 2018-02-25 NOTE — Therapy (Signed)
[place], [name], [address] Phone: [phone]   Fax:  [phone]  Physical Therapy Treatment  Patient Details  Name: Toni Medina MRN: [id number] Date of Birth: 11-Jul-1931 Referring Provider (PT): Dr. Latanya Maudlin    Encounter Date: 02/25/2018  PT End of Session - 02/25/18 1435    Visit Number  16    Number of Visits  17    Date for PT Re-Evaluation  03/18/18    PT Start Time  1324    PT Stop Time  1500    PT Time Calculation (min)  45 min    Activity Tolerance  Patient tolerated treatment well    Behavior During Therapy  [facility] for tasks assessed/performed       No past medical history on file.  Past Surgical History:  Procedure Laterality Date  . BREAST EXCISIONAL BIOPSY Left 2014    There were no vitals filed for this visit.    [place] Adult PT Treatment/Exercise - 02/25/18 0001      Lumbar Exercises: Stretches   Hip Flexor Stretch  2 reps;30 seconds    Hip Flexor Stretch Limitations  over foam roller       Lumbar Exercises: Aerobic   Tread Mill  1.5 mph for 8 min    less foot drag with cues , min      Lumbar Exercises: Supine   Clam Limitations  alternating clam green band x 10     Bridge  10 reps    Bridge Limitations  with band , about 50% limited ROM     Bridge with clamshell  10 reps    Bridge with [employer] Limitations  with green band       Knee/Hip Exercises: Clinical research associate  Both;3 reps;30 seconds    Other Knee/Hip Stretches  also on wall x 1 runner stretch       Shoulder Exercises: Standing   Protraction  Strengthening;Both;10 reps    Protraction Weight (lbs)  wall protract/retract    Horizontal ABduction  Strengthening;Both;10 reps    Theraband Level (Shoulder Horizontal ABduction)  Level 3 (Green)    Theraband Level (Shoulder Diagonals)  Level 3 (Green)    Diagonals Weight (lbs)  band looped, semi circles bilateral arm for scap stab.     Other  Standing Exercises  goal post openings x 15, bent elbow flexion x 10     Other Standing Exercises  wall slides facing wall with lift off x 10 sec.  Felt work in [employer], assumed sway back posture           PT Long Term Goals - 02/09/18 1213      PT LONG TERM GOAL #1   Title  Pt will be I with HEP for strength, posture    Status  On-going      PT LONG TERM GOAL #2   Title  Pt will be able to improve her posture in her home, demonstrating good lifting mechanics for housework.     Status  On-going      PT LONG TERM GOAL #3   Title  Pt will be able to walk/shop for 20-30 min in the community without buggy, less fatigue in low back.     Baseline  back pain has increased to 6/10 with walking and being on her feet     Status  On-going      PT LONG TERM GOAL #4   Title  Pt will be able to increase hip strength to 4/5 overall or greater (abduction, extension) for improved gait, stability     Baseline  3+/5 to 4/5    Status  On-going      PT LONG TERM GOAL #5   Title  FOTO score will improve by 10% or more showing improved functional mobility.     Status  On-going            Plan - 02/25/18 2043    Clinical Impression Statement  Limited in upper back strength and has difficulty feeling muscular work here during exercises.  She uses her back extensors and this causes her fatigue.  Pt demonstrate the degree of trunk flexxion with household mobility and it was significant.  Asked her to focus more on scapular retraction while in her home and give herself timed blocks to address vs.  letting it go all day and then chasing pain afterwards.  Her pain medicine (Meloxicam) does improve her discomfort but she prefes not to take it daily.     PT Treatment/Interventions  ADLs/Self Care Home Management;Moist Heat;Therapeutic activities;Therapeutic exercise;Patient/family education;Balance training;Functional mobility training;Neuromuscular re-education;Manual techniques    PT Next Visit Plan  Cont  prone/posterior chain : upper back/prone ,standing preferred as tolerated , seated on ball , sit to stand /parallel bars side stepping     PT Home Exercise Plan  hip abduction, bridge, standing hip flexor hip flexor stretch in prone/ supine, sit to stand.  UE wall slide with hip extension, row, ext and bird dog , standing calf    Consulted and Agree with Plan of Care  Patient       Patient will benefit from skilled therapeutic intervention in order to improve the following deficits and impairments:     Visit Diagnosis: Abnormal posture  Muscle weakness (generalized)  Chronic midline low back pain without sciatica     Problem List There are no active problems to display for this patient.   Toni Medina 02/25/2018, 8:46 PM  [place], [name], [address] Phone: [phone]   Fax:  [phone]  Name: Toni Medina MRN: [id number] Date of Birth: May 23, 1931   [name], PT 02/25/18 8:47 PM Phone: [phone] Fax: [phone]

## 2018-03-02 ENCOUNTER — Ambulatory Visit: Payer: Medicare Other | Admitting: Physical Therapy

## 2018-03-02 ENCOUNTER — Encounter: Payer: Self-pay | Admitting: Physical Therapy

## 2018-03-02 DIAGNOSIS — M6281 Muscle weakness (generalized): Secondary | ICD-10-CM

## 2018-03-02 DIAGNOSIS — M545 Low back pain, unspecified: Secondary | ICD-10-CM

## 2018-03-02 DIAGNOSIS — R293 Abnormal posture: Secondary | ICD-10-CM | POA: Diagnosis not present

## 2018-03-02 DIAGNOSIS — G8929 Other chronic pain: Secondary | ICD-10-CM | POA: Diagnosis not present

## 2018-03-02 NOTE — Therapy (Signed)
[place], [name], [address] Phone: [phone]   Fax:  [phone]  Physical Therapy Treatment  Patient Details  Name: Toni Medina MRN: [id number] Date of Birth: 12-09-1931 Referring Provider (PT): Dr. Latanya Maudlin    Encounter Date: 03/02/2018  PT End of Session - 03/02/18 1151    Visit Number  17    Date for PT Re-Evaluation  03/18/18    PT Start Time  1950    PT Stop Time  1115    PT Time Calculation (min)  60 min    Activity Tolerance  Patient tolerated treatment well    Behavior During Therapy  [facility] for tasks assessed/performed       History reviewed. No pertinent past medical history.  Past Surgical History:  Procedure Laterality Date  . BREAST EXCISIONAL BIOPSY Left 2014    There were no vitals filed for this visit.  Subjective Assessment - 03/02/18 1020    Subjective  No back discomfort.  Does not realize she is catching her toes on the treadmill.  Continues to ask about prognosis.  Has to constantly be tightnening and squeezing her muscles in order to walk upright.     Currently in Pain?  No/denies                       [facility] Adult PT Treatment/Exercise - 03/02/18 0001      Pilates   Pilates Mat  prone single leg kick x 10 , pain in R knee       Lumbar Exercises: Stretches   Active Hamstring Stretch  3 reps;30 seconds      Lumbar Exercises: Aerobic   Tread Mill  1.5 mph , 1 % for 8 min    less foot drag with cues , min      Lumbar Exercises: Prone   Straight Leg Raise  10 reps      Lumbar Exercises: Quadruped   Opposite Arm/Leg Raise  Right arm/Left leg;Left arm/Right leg;5 reps    Opposite Arm/Leg Raise Limitations  10 sec hold     Plank  bear plank x 10 sec    Other Quadruped Lumbar Exercises  childs pose x 2 for recovery from prone       Shoulder Exercises: Prone   Retraction  Strengthening;Both;10 reps    Extension  Strengthening;Both;10 reps    Horizontal ABduction 1  Strengthening;Both;10 reps    Other Prone Exercises  upper back extension x 10    Other Prone Exercises  swiming x 10 each side.  Each exercise done on  BOSU ball side up.       Shoulder Exercises: ROM/Strengthening   Wall Wash  wall slide with lift off 10 sec hold for back extensor strength x 5     "W" Arms  x 10     X to V Arms  x 10     Other ROM/Strengthening Exercises  flexion x 10 red band on wall for posture       Shoulder Exercises: Stretch   Corner Stretch  3 reps;30 seconds                  PT Long Term Goals - 02/09/18 1213      PT LONG TERM GOAL #1   Title  Pt will be I with HEP for strength, posture    Status  On-going      PT LONG TERM GOAL #2  Title  Pt will be able to improve her posture in her home, demonstrating good lifting mechanics for housework.     Status  On-going      PT LONG TERM GOAL #3   Title  Pt will be able to walk/shop for 20-30 min in the community without buggy, less fatigue in low back.     Baseline  back pain has increased to 6/10 with walking and being on her feet     Status  On-going      PT LONG TERM GOAL #4   Title  Pt will be able to increase hip strength to 4/5 overall or greater (abduction, extension) for improved gait, stability     Baseline  3+/5 to 4/5    Status  On-going      PT LONG TERM GOAL #5   Title  FOTO score will improve by 10% or more showing improved functional mobility.     Status  On-going            Plan - 03/02/18 1151    Clinical Impression Statement  Continues to work on posterior chain strength and proprioceptive exercises to improve upright posture in standing, walking.      PT Next Visit Plan  Cont prone/posterior chain : upper back/prone ,standing preferred as tolerated , seated on ball , sit to stand /parallel bars side stepping     PT Home Exercise Plan  hip abduction, bridge, standing hip flexor hip flexor stretch in prone/ supine, sit to stand.  UE wall slide  with hip extension, row, ext and bird dog , standing calf    Consulted and Agree with Plan of Care  Patient       Patient will benefit from skilled therapeutic intervention in order to improve the following deficits and impairments:  Decreased strength, Increased fascial restricitons, Impaired flexibility, Postural dysfunction, Pain, Improper body mechanics, Difficulty walking, Increased muscle spasms, Decreased mobility  Visit Diagnosis: Abnormal posture  Muscle weakness (generalized)  Chronic midline low back pain without sciatica     Problem List There are no active problems to display for this patient.   , 03/02/2018, 11:53 AM  [facility] [address] [place], [name], [address] Phone: [phone]   Fax:  [phone]  Name: Toni Medina MRN: [id number] Date of Birth: [birth year]  [name], PT 03/02/18 11:53 AM Phone: [phone] Fax: [phone]

## 2018-03-04 ENCOUNTER — Ambulatory Visit: Payer: Medicare Other | Admitting: Physical Therapy

## 2018-03-04 ENCOUNTER — Encounter: Payer: Self-pay | Admitting: Physical Therapy

## 2018-03-04 DIAGNOSIS — M545 Low back pain, unspecified: Secondary | ICD-10-CM

## 2018-03-04 DIAGNOSIS — M6281 Muscle weakness (generalized): Secondary | ICD-10-CM | POA: Diagnosis not present

## 2018-03-04 DIAGNOSIS — G8929 Other chronic pain: Secondary | ICD-10-CM | POA: Diagnosis not present

## 2018-03-04 DIAGNOSIS — R293 Abnormal posture: Secondary | ICD-10-CM | POA: Diagnosis not present

## 2018-03-04 NOTE — Therapy (Signed)
[place], [name], [address] Phone: [phone]   Fax:  [phone]  Physical Therapy Treatment  Patient Details  Name: Toni Medina MRN: [id number] Date of Birth: 09-17-1931 Referring Provider (PT): Dr. Latanya Maudlin    Encounter Date: 03/04/2018  PT End of Session - 03/04/18 1029    Visit Number  18    Date for PT Re-Evaluation  03/18/18    PT Start Time  2876    PT Stop Time  1100    PT Time Calculation (min)  45 min    Activity Tolerance  Patient tolerated treatment well    Behavior During Therapy  [facility] for tasks assessed/performed       History reviewed. No pertinent past medical history.  Past Surgical History:  Procedure Laterality Date  . BREAST EXCISIONAL BIOPSY Left 2014    There were no vitals filed for this visit.  Subjective Assessment - 03/04/18 1022    Subjective  Pt took Excederin this AM.  Trying to see if it makes a difference.  No pain right now.     Currently in Pain?  No/denies         [facility] PT Assessment - 03/04/18 0001      Functional Tests   Functional tests  Sit to Stand      Sit to Stand   Comments  x 5, 14 sec, 30 sec done x 16 reps       Strength   Right Hand Grip (lbs)  23.5lb s    Left Hand Grip (lbs)  23.5     Right Hip ABduction  3/5    Left Hip ABduction  4-/5        [facility] Adult PT Treatment/Exercise - 03/04/18 0001      Lumbar Exercises: Stretches   Piriformis Stretch  2 reps;30 seconds    Piriformis Stretch Limitations  hard to get position     Figure 4 Stretch  2 reps;30 seconds      Lumbar Exercises: Aerobic   UBE (Upper Arm Bike)  5 min L1 (2.5 min each direction)       Lumbar Exercises: Standing   Functional Squats  10 reps    Functional Squats Limitations  10 lbs x 8, knee pain     Lifting Weights (lbs)  gait with 10 lb walking about 150 feet towel under arm, cues     Lifting Limitations  gait with 10 lbs kettlebell behind her  back/hips (easy)  and then tried at chest hgt and was very hard for her       Lumbar Exercises: Sidelying   Clam  Both;20 reps    Hip Abduction  Both;20 reps    Other Sidelying Lumbar Exercises  sidekick series flex.ext x 10              PT Education - 03/04/18 1040    Education Details  form with exercise and hip strength     Person(s) Educated  Patient    Methods  Explanation    Comprehension  Verbalized understanding;Returned demonstration          PT Long Term Goals - 03/04/18 1026      PT LONG TERM GOAL #1   Title  Pt will be I with HEP for strength, posture    Status  On-going      PT LONG TERM GOAL #2   Title  Pt will be able to improve her posture  in her home, demonstrating good lifting mechanics for housework.     Baseline  standing posture can be corrected, walking any length of time is not happening.     Status  On-going      PT LONG TERM GOAL #3   Title  Pt will be able to walk/shop for 20-30 min in the community without buggy, less fatigue in low back.     Baseline  the longer she walks the pain goes up, fatigue.     Status  On-going      PT LONG TERM GOAL #4   Title  Pt will be able to increase hip strength to 4/5 overall or greater (abduction, extension) for improved gait, stability     Status  On-going      PT LONG TERM GOAL #5   Title  FOTO score will improve by 10% or more showing improved functional mobility.     Status  On-going            Plan - 03/04/18 1038    Clinical Impression Statement  Pt continues to have lateral hip weakness, improved on L but 3/5 on Rt LE.  Patient had difficulty holding 10 lbs at her chest and walking.  Will lessen weight and begin graded exposure to lifting increased weight.  Patient is not seeing much carry over in all of her work in PT, she does her exercises intermittently but cannot walk any distance without leaning forward.  Approaching end of POC and a plateau of progress, will revisit next week.     PT  Treatment/Interventions  ADLs/Self Care Home Management;Moist Heat;Therapeutic activities;Therapeutic exercise;Patient/family education;Balance training;Functional mobility training;Neuromuscular re-education;Manual techniques    PT Next Visit Plan  Cont prone/posterior chain : upper back/prone , sideying hip work Walk with 2, 5, etc lbs holding to chest     PT Home Exercise Plan  hip abduction, bridge, standing hip flexor hip flexor stretch in prone/ supine, sit to stand.  UE wall slide with hip extension, row, ext and bird dog , standing calf    Consulted and Agree with Plan of Care  Patient       Patient will benefit from skilled therapeutic intervention in order to improve the following deficits and impairments:  Decreased strength, Increased fascial restricitons, Impaired flexibility, Postural dysfunction, Pain, Improper body mechanics, Difficulty walking, Increased muscle spasms, Decreased mobility  Visit Diagnosis: Abnormal posture  Muscle weakness (generalized)  Chronic midline low back pain without sciatica     Problem List There are no active problems to display for this patient.   Keen Ewalt 03/04/2018, 12:35 PM  [facility] [address] [place], [name], [address] Phone: [phone]   Fax:  [phone]  Name: Toni Medina MRN: [id number] Date of Birth: 09-04-1931  [name], PT 03/04/18 12:35 PM Phone: [phone] Fax: [phone]

## 2018-03-09 ENCOUNTER — Ambulatory Visit: Payer: Medicare Other | Attending: Orthopedic Surgery | Admitting: Physical Therapy

## 2018-03-09 ENCOUNTER — Encounter: Payer: Self-pay | Admitting: Physical Therapy

## 2018-03-09 DIAGNOSIS — G8929 Other chronic pain: Secondary | ICD-10-CM | POA: Diagnosis not present

## 2018-03-09 DIAGNOSIS — R293 Abnormal posture: Secondary | ICD-10-CM | POA: Diagnosis not present

## 2018-03-09 DIAGNOSIS — M545 Low back pain, unspecified: Secondary | ICD-10-CM

## 2018-03-09 DIAGNOSIS — M6281 Muscle weakness (generalized): Secondary | ICD-10-CM

## 2018-03-09 NOTE — Therapy (Addendum)
[place], [name], [address] Phone: [phone]   Fax:  [phone]  Physical Therapy Treatment  Patient Details  Name: Toni Medina MRN: [id number] Date of Birth: Jun 18, 1931 Referring Provider (PT): Dr. Latanya Maudlin     Progress Note Reporting Period 01/19/18 to 03/09/18  See note below for Objective Data and Assessment of Progress/Goals.       Encounter Date: 03/09/2018  PT End of Session - 03/09/18 0930    Visit Number  19    Date for PT Re-Evaluation  03/18/18    PT Start Time  0925    PT Stop Time  1016    PT Time Calculation (min)  51 min    Activity Tolerance  Patient tolerated treatment well    Behavior During Therapy  [facility] for tasks assessed/performed       History reviewed. No pertinent past medical history.  Past Surgical History:  Procedure Laterality Date  . BREAST EXCISIONAL BIOPSY Left 2014    There were no vitals filed for this visit.  Subjective Assessment - 03/09/18 0926    Subjective  No pain this AM.  The Excederin makes her jittery.  Sitting down makes it better.     Currently in Pain?  No/denies   it early         [facility] Adult PT Treatment/Exercise - 03/09/18 0001      Ambulation/Gait   Gait Comments  gait with 2 lbs weight, with and without back brace .  Pain increased after about 75 feet to moderate (4/10)       Self-Care   Other Self-Care Comments   braces, Spino med vs DDS, SI belt       Lumbar Exercises: Standing   Other Standing Lumbar Exercises  on foam pad, core press x 10, UE flexion x 10 each arm, then light wgts flex, abd 1 lbs x 10 each side       Lumbar Exercises: Sidelying   Hip Abduction  Both;20 reps      Knee/Hip Exercises: Standing   Hip Flexion  Stengthening;Both;1 set;10 reps    Hip Flexion Limitations  on foam pad 1 UE assist     Hip Abduction  Stengthening;Both;1 set;20 reps;Knee straight    Abduction Limitations  step to maintain  level pelvis     Lateral Step Up  Both;1 set;20 reps;Hand Hold: 2;Step Height: 4"             PT Education - 03/09/18 1043    Education Details  Bracing options    Person(s) Educated  Patient    Methods  Explanation    Comprehension  Verbalized understanding          PT Long Term Goals - 03/04/18 1026      PT LONG TERM GOAL #1   Title  Pt will be I with HEP for strength, posture    Status  On-going      PT LONG TERM GOAL #2   Title  Pt will be able to improve her posture in her home, demonstrating good lifting mechanics for housework.     Baseline  standing posture can be corrected, walking any length of time is not happening.     Status  On-going      PT LONG TERM GOAL #3   Title  Pt will be able to walk/shop for 20-30 min in the community without buggy, less fatigue in low back.  Baseline  the longer she walks the pain goes up, fatigue.     Status  On-going      PT LONG TERM GOAL #4   Title  Pt will be able to increase hip strength to 4/5 overall or greater (abduction, extension) for improved gait, stability     Status  On-going      PT LONG TERM GOAL #5   Title  FOTO score will improve by 10% or more showing improved functional mobility.     Status  On-going            Plan - 03/09/18 1038    Clinical Impression Statement  Worked on standing and walking tolerance with light weight.  Recommended she explore options for bracing as her progress has been limited.  She has good core strength for her age but lacks back extensor strength and hip strength.  Email send to rep to see if he can come to one of her next appts.      PT Treatment/Interventions  ADLs/Self Care Home Management;Moist Heat;Therapeutic activities;Therapeutic exercise;Patient/family education;Balance training;Functional mobility training;Neuromuscular re-education;Manual techniques    PT Next Visit Plan  Cont prone/posterior chain : upper back/prone , sideying hip work Walk with 2, 5, etc lbs  holding to chest     PT Home Exercise Plan  hip abduction, bridge, standing hip flexor hip flexor stretch in prone/ supine, sit to stand.  UE wall slide with hip extension, row, ext and bird dog , standing calf    Consulted and Agree with Plan of Care  Patient       Patient will benefit from skilled therapeutic intervention in order to improve the following deficits and impairments:  Decreased strength, Increased fascial restricitons, Impaired flexibility, Postural dysfunction, Pain, Improper body mechanics, Difficulty walking, Increased muscle spasms, Decreased mobility  Visit Diagnosis: Abnormal posture  Muscle weakness (generalized)  Chronic midline low back pain without sciatica     Problem List There are no active problems to display for this patient.   Lovelee Forner 03/09/2018, 10:45 AM  [place], [name], [address] Phone: [phone]   Fax:  [phone]  Name: Toni Medina MRN: [id number] Date of Birth: [birth year]  [name], PT 03/09/18 10:45 AM Phone: [phone] Fax: [phone]

## 2018-03-09 NOTE — Patient Instructions (Signed)
Printed Spino Med brace from website

## 2018-03-11 ENCOUNTER — Ambulatory Visit: Payer: Medicare Other | Admitting: Physical Therapy

## 2018-03-11 DIAGNOSIS — R293 Abnormal posture: Secondary | ICD-10-CM | POA: Diagnosis not present

## 2018-03-11 DIAGNOSIS — M545 Low back pain, unspecified: Secondary | ICD-10-CM

## 2018-03-11 DIAGNOSIS — M6281 Muscle weakness (generalized): Secondary | ICD-10-CM | POA: Diagnosis not present

## 2018-03-11 DIAGNOSIS — G8929 Other chronic pain: Secondary | ICD-10-CM | POA: Diagnosis not present

## 2018-03-11 NOTE — Therapy (Signed)
[place], [name], [address] Phone: [phone]   Fax:  [phone]  Physical Therapy Treatment  Patient Details  Name: Toni Medina MRN: [id number] Date of Birth: [birth year] Referring Provider (PT): Dr. Latanya Maudlin    Encounter Date: 03/11/2018  PT End of Session - 03/11/18 1230    Visit Number  20    Authorization Type  KX modifier    PT Start Time  1150    PT Stop Time  1240    PT Time Calculation (min)  50 min    Activity Tolerance  Patient tolerated treatment well    Behavior During Therapy  [facility] for tasks assessed/performed       No past medical history on file.  Past Surgical History:  Procedure Laterality Date  . BREAST EXCISIONAL BIOPSY Left 2014    There were no vitals filed for this visit.  Subjective Assessment - 03/11/18 1314    Subjective  Back pain minimal, daughter here to see what I do.     Currently in Pain?  Yes    Pain Score  2     Pain Location  Back    Pain Orientation  Lower    Pain Descriptors / Indicators  Tiring    Pain Type  Chronic pain    Pain Onset  More than a month ago    Pain Frequency  Intermittent    Aggravating Factors   standing, spine extension     Pain Relieving Factors  rest, sitting, heat          [facility] Adult PT Treatment/Exercise - 03/11/18 0001      Ambulation/Gait   Gait Comments  Walked with brace applaied about 180 feet, more upright but disoriented.  See note.        Self-Care   Other Self-Care Comments   spino med brace, application and uses, recommendations, HEP       Lumbar Exercises: Aerobic   Tread Mill  1.5 mph, 8 min used hands to improve upright posture       Lumbar Exercises: Supine   Bridge  10 reps    Bridge Limitations  much improved height since beginning       Lumbar Exercises: Quadruped   Opposite Arm/Leg Raise  Right arm/Left leg;Left arm/Right leg;10 reps      Knee/Hip Exercises: Sidelying   Hip ABduction   Strengthening;Both;1 set;10 reps      Shoulder Exercises: Standing   Horizontal ABduction  Strengthening;Both;15 reps    Theraband Level (Shoulder Horizontal ABduction)  Level 2 (Red)    Horizontal ABduction Weight (lbs)  back against the wall     Flexion  AROM;Strengthening;Both;10 reps    Other Standing Exercises  "W" arms x 10              PT Education - 03/11/18 1315    Education Details  spino med brace, HEP review , posture, causes of dizziness, neuro re -ed.     Person(s) Educated  Patient    Methods  Explanation;Demonstration;Verbal cues    Comprehension  Verbalized understanding;Returned demonstration;Need further instruction          PT Long Term Goals - 03/04/18 1026      PT LONG TERM GOAL #1   Title  Pt will be I with HEP for strength, posture    Status  On-going      PT LONG TERM GOAL #2   Title  Pt will be  able to improve her posture in her home, demonstrating good lifting mechanics for housework.     Baseline  standing posture can be corrected, walking any length of time is not happening.     Status  On-going      PT LONG TERM GOAL #3   Title  Pt will be able to walk/shop for 20-30 min in the community without buggy, less fatigue in low back.     Baseline  the longer she walks the pain goes up, fatigue.     Status  On-going      PT LONG TERM GOAL #4   Title  Pt will be able to increase hip strength to 4/5 overall or greater (abduction, extension) for improved gait, stability     Status  On-going      PT LONG TERM GOAL #5   Title  FOTO score will improve by 10% or more showing improved functional mobility.     Status  On-going            Plan - 03/11/18 1316    Clinical Impression Statement  Patient's daughter here for PT session.  She observed exercises today and understands what we have been working on, [employer]. We also tried a Systems developer of the Spino Med brace to improve her upright posture and function.She became a bit disoriented and slightly  dizzy as she walked with it.  Denied claustrophobia, vertebral A testig normal.  It may have been an abnormal sensation for her to be more upright with walking.  She would like to get more info as far as payment and return policy goes.  Cont POC and begin wrap up.     PT Treatment/Interventions  ADLs/Self Care Home Management;Moist Heat;Therapeutic activities;Therapeutic exercise;Patient/family education;Balance training;Functional mobility training;Neuromuscular re-education;Manual techniques    PT Next Visit Plan  Cont prone/posterior chain : upper back/prone , sideying hip work Walk with 2, 5, etc lbs holding to chest     PT Home Exercise Plan  hip abduction, bridge, standing hip flexor hip flexor stretch in prone/ supine, sit to stand.  UE wall slide with hip extension, row, ext and bird dog , standing calf    Consulted and Agree with Plan of Care  Patient;Family member/caregiver    Family Member Consulted  daughter       Patient will benefit from skilled therapeutic intervention in order to improve the following deficits and impairments:  Decreased strength, Increased fascial restricitons, Impaired flexibility, Postural dysfunction, Pain, Improper body mechanics, Difficulty walking, Increased muscle spasms, Decreased mobility  Visit Diagnosis: Abnormal posture  Muscle weakness (generalized)  Chronic midline low back pain without sciatica     Problem List There are no active problems to display for this patient.   PAA,JENNIFER 03/11/2018, 1:57 PM  [place], [name], [address] Phone: [phone]   Fax:  [phone]  Name: Toni Medina MRN: [id number] Date of Birth: 04-24-1931  [name], PT 03/11/18 1:58 PM Phone: [phone] Fax: [phone]

## 2018-03-16 ENCOUNTER — Encounter: Payer: Self-pay | Admitting: Physical Therapy

## 2018-03-16 ENCOUNTER — Ambulatory Visit: Payer: Medicare Other | Admitting: Physical Therapy

## 2018-03-16 DIAGNOSIS — M545 Low back pain, unspecified: Secondary | ICD-10-CM

## 2018-03-16 DIAGNOSIS — R293 Abnormal posture: Secondary | ICD-10-CM

## 2018-03-16 DIAGNOSIS — M6281 Muscle weakness (generalized): Secondary | ICD-10-CM

## 2018-03-16 DIAGNOSIS — G8929 Other chronic pain: Secondary | ICD-10-CM

## 2018-03-16 NOTE — Therapy (Addendum)
[place], [name], [address] Phone: [phone]   Fax:  [phone]  Physical Therapy Treatment  Patient Details  Name: Toni Medina MRN: [id number] Date of Birth: May 16, 1931 Referring Provider (PT): Dr. Latanya Maudlin    Encounter Date: 03/16/2018  PT End of Session - 03/16/18 1159    Visit Number  21    Authorization Type  KX modifier    PT Start Time  7353    PT Stop Time  1230    PT Time Calculation (min)  38 min    Activity Tolerance  Patient tolerated treatment well    Behavior During Therapy  [facility] for tasks assessed/performed       History reviewed. No pertinent past medical history.  Past Surgical History:  Procedure Laterality Date  . BREAST EXCISIONAL BIOPSY Left 2014    There were no vitals filed for this visit.  Subjective Assessment - 03/16/18 1157    Subjective  no back pain today, running a bit late. I am 50/50 with the brace I want to be sure I can return it if I need to.     Currently in Pain?  No/denies           [facility] Adult PT Treatment/Exercise - 03/16/18 0001      Lumbar Exercises: Aerobic    LE and UE, 10 min NUSTEP  cues for spine extension, L6 UE and LE 10 min       Lumbar Exercises: Machines for Strengthening   Other Lumbar Machine Exercise  row 3 plates 2 x 10,  high row 15 lbs x 2 x 10     Other Lumbar Machine Exercise  chest press 15 lbs x 2 x 10       Lumbar Exercises: Standing   Scapular Retraction Limitations  used 1 , 2 lbs wgt flexion with back against wall and also without.  Could do 1 lb but not 2 lbs     Other Standing Lumbar Exercises  magic circle upper back , driving, flexion lift    Other Standing Lumbar Exercises  wall slides facing the wall with lift off x 10 , cues       Lumbar Exercises: Quadruped   [place] Limitations  modified on table (standing) and with hands clasped to release mid back       Knee/Hip Exercises: Machines for  Strengthening   Cybex Knee Extension  15 lbs 2 x 10     Cybex Knee Flexion  20 lbs 2 x 10     Cybex Leg Press  1 plate x 10 x 2      ,      PT Long Term Goals - 03/04/18 1026      PT LONG TERM GOAL #1   Title  Pt will be I with HEP for strength, posture    Status  On-going      PT LONG TERM GOAL #2   Title  Pt will be able to improve her posture in her home, demonstrating good lifting mechanics for housework.     Baseline  standing posture can be corrected, walking any length of time is not happening.     Status  On-going      PT LONG TERM GOAL #3   Title  Pt will be able to walk/shop for 20-30 min in the community without buggy, less fatigue in low back.     Baseline  the longer she walks  the pain goes up, fatigue.     Status  On-going      PT LONG TERM GOAL #4   Title  Pt will be able to increase hip strength to 4/5 overall or greater (abduction, extension) for improved gait, stability     Status  On-going      PT LONG TERM GOAL #5   Title  FOTO score will improve by 10% or more showing improved functional mobility.     Status  On-going        Ordering Spinomed Back Brace  [name], PT 03/18/18 10:48 AM Phone: [phone] Fax: [phone]     Plan - 03/16/18 1159    Clinical Impression Statement  Cont to work on posture and ability to maintain trunk extension with carrying items, walking.  Used weight machines today and seemed to enjoy it.   She may consider Pilates class again.  Will be DC next visit.     PT Treatment/Interventions  ADLs/Self Care Home Management;Moist Heat;Therapeutic activities;Therapeutic exercise;Patient/family education;Balance training;Functional mobility training;Neuromuscular re-education;Manual techniques    PT Next Visit Plan  FULL HEP review, FOTO, DC Cont prone/posterior chain : upper back/prone , sideying hip work Walk with 2, 5, etc lbs holding to chest     PT Home Exercise Plan  hip abduction, bridge, standing hip flexor hip  flexor stretch in prone/ supine, sit to stand.  UE wall slide with hip extension, row, ext and bird dog , standing calf    Consulted and Agree with Plan of Care  Patient       Patient will benefit from skilled therapeutic intervention in order to improve the following deficits and impairments:  Decreased strength, Increased fascial restricitons, Impaired flexibility, Postural dysfunction, Pain, Improper body mechanics, Difficulty walking, Increased muscle spasms, Decreased mobility  Visit Diagnosis: Abnormal posture  Muscle weakness (generalized)  Chronic midline low back pain without sciatica     Problem List There are no active problems to display for this patient.   Toni Medina 03/16/2018, 12:45 PM  [place], [name], [address] Phone: [phone]   Fax:  [phone]  Name: Toni Medina MRN: [id number] Date of Birth: 1931-04-24    [name], PT 03/16/18 12:46 PM Phone: [phone] Fax: [phone]

## 2018-03-18 ENCOUNTER — Ambulatory Visit: Payer: Medicare Other | Admitting: Physical Therapy

## 2018-03-18 ENCOUNTER — Encounter: Payer: Self-pay | Admitting: Physical Therapy

## 2018-03-18 DIAGNOSIS — R293 Abnormal posture: Secondary | ICD-10-CM

## 2018-03-18 DIAGNOSIS — G8929 Other chronic pain: Secondary | ICD-10-CM | POA: Diagnosis not present

## 2018-03-18 DIAGNOSIS — M545 Low back pain, unspecified: Secondary | ICD-10-CM

## 2018-03-18 DIAGNOSIS — M6281 Muscle weakness (generalized): Secondary | ICD-10-CM

## 2018-03-18 NOTE — Therapy (Signed)
[place], [name], [address] Phone: [phone]   Fax:  [phone]  Physical Therapy Treatment/Discharge  Patient Details  Name: Toni Medina MRN: [id number] Date of Birth: 1931-12-09 Referring Provider (PT): Dr. Latanya Maudlin    Encounter Date: 03/18/2018  PT End of Session - 03/18/18 1509    Visit Number  22    PT Start Time  1503    PT Stop Time  1549    PT Time Calculation (min)  46 min    Activity Tolerance  Patient tolerated treatment well    Behavior During Therapy  [facility] for tasks assessed/performed       History reviewed. No pertinent past medical history.  Past Surgical History:  Procedure Laterality Date  . BREAST EXCISIONAL BIOPSY Left 2014    There were no vitals filed for this visit.  Subjective Assessment - 03/18/18 1627    Subjective  No pain.  Brought in exercises.  Patient not full aware today was her last day.      Currently in Pain?  No/denies         [facility] PT Assessment - 03/18/18 0001      Strength   Right Hip Extension  3+/5    Right Hip ABduction  3+/5    Left Hip Extension  3/5    Left Hip ABduction  3+/5          [facility] Adult PT Treatment/Exercise - 03/18/18 0001      Self-Care   Other Self-Care Comments   FOTO, POC, DC, final HEP      Lumbar Exercises: Stretches   Active Hamstring Stretch  Right;Left;1 rep;30 seconds    Active Hamstring Stretch Limitations  seated    Lower Trunk Rotation  10 seconds    Lower Trunk Rotation Limitations  x 10     Hip Flexor Stretch  2 reps      Lumbar Exercises: Supine   Bridge  10 reps      Knee/Hip Exercises: Sidelying   Hip ABduction  Strengthening;Both;1 set;20 reps      Shoulder Exercises: ROM/Strengthening   Other ROM/Strengthening Exercises  wall for posture, add in shoulder flexion and then horiz abd red x 20, cues throughout              PT Education - 03/18/18 1630    Education Details  HEP,  resources, rationale for hip strength, posture, etc.     Person(s) Educated  Patient    Methods  Explanation    Comprehension  Verbalized understanding          PT Long Term Goals - 03/18/18 1525      PT LONG TERM GOAL #1   Title  Pt will be I with HEP for strength, posture    Status  Achieved      PT LONG TERM GOAL #2   Title  Pt will be able to improve her posture in her home, demonstrating good lifting mechanics for housework.     Baseline  standing posture can be corrected, walking any length of time is not happening.     Status  Partially Met      PT LONG TERM GOAL #3   Title  Pt will be able to walk/shop for 20-30 min in the community without buggy, less fatigue in low back.     Baseline  improved but not substantially     Status  Partially Met  PT LONG TERM GOAL #4   Title  Pt will be able to increase hip strength to 4/5 overall or greater (abduction, extension) for improved gait, stability       PT LONG TERM GOAL #5   Title  FOTO score will improve by 10% or more showing improved functional mobility.     Baseline  3%    Status  On-going            Plan - 03/18/18 1628    Clinical Impression Statement  Pt with only 3% improvement seen on FOTO.  Needed increased time explaining rationale for discharge.  Resources given for brace, HEP and may do PIlates again in a few weeks.  She admits to not committing to HEP as best as she could during the episode, citing <50% compliance.  See DC>     PT Treatment/Interventions  ADLs/Self Care Home Management;Moist Heat;Therapeutic activities;Therapeutic exercise;Patient/family education;Balance training;Functional mobility training;Neuromuscular re-education;Manual techniques    PT Next Visit Plan  DC     PT Home Exercise Plan  hip abduction, bridge, standing hip flexor hip flexor stretch in prone/ supine, sit to stand.  UE wall slide with hip extension, row, ext and bird dog , standing calf    Consulted and Agree with Plan  of Care  Patient       Patient will benefit from skilled therapeutic intervention in order to improve the following deficits and impairments:  Decreased strength, Increased fascial restricitons, Impaired flexibility, Postural dysfunction, Pain, Improper body mechanics, Difficulty walking, Increased muscle spasms, Decreased mobility  Visit Diagnosis: Abnormal posture  Muscle weakness (generalized)  Chronic midline low back pain without sciatica     Problem List There are no active problems to display for this patient.   Toni Medina 03/18/2018, 4:35 PM  [facility] [address] [place], [name], [address] Phone: [phone]   Fax:  [phone]  Name: Toni Medina MRN: [id number] Date of Birth: 27-Dec-1931  PHYSICAL THERAPY DISCHARGE SUMMARY  Visits from Start of Care: 22  Current functional level related to goals / functional outcomes: Cont to be limited in walking upright due to fatigue in low back.  Postural strength limits endurance.  Quite active for age despite her poor posture.    Remaining deficits: Hip and posterior chain weakness, hip flexor/quad tightness  Education / Equipment: Extensive on posture, hips, strength, technique and rationale for exercises, options for continued exercise post PT, pilates.  Plan: Patient agrees to discharge.  Patient goals were partially met. Patient is being discharged due to lack of progress.  ?????    Toni Medina, PT 03/18/18 4:35 PM Phone: [phone] Fax: [phone]

## 2018-03-23 ENCOUNTER — Ambulatory Visit: Payer: Medicare Other | Admitting: Physical Therapy

## 2018-03-26 ENCOUNTER — Ambulatory Visit: Payer: Medicare Other | Admitting: Physical Therapy

## 2018-07-16 ENCOUNTER — Other Ambulatory Visit: Payer: Self-pay | Admitting: Internal Medicine

## 2018-07-16 DIAGNOSIS — Z1231 Encounter for screening mammogram for malignant neoplasm of breast: Secondary | ICD-10-CM

## 2018-08-31 ENCOUNTER — Other Ambulatory Visit: Payer: Self-pay

## 2018-08-31 ENCOUNTER — Ambulatory Visit
Admission: RE | Admit: 2018-08-31 | Discharge: 2018-08-31 | Disposition: A | Discharge status: Home / Self Care | Payer: Medicare Other | Source: Ambulatory Visit | Attending: Internal Medicine | Admitting: Internal Medicine

## 2018-08-31 DIAGNOSIS — Z1231 Encounter for screening mammogram for malignant neoplasm of breast: Secondary | ICD-10-CM | POA: Diagnosis not present

## 2018-11-11 DIAGNOSIS — Z23 Encounter for immunization: Secondary | ICD-10-CM | POA: Diagnosis not present

## 2018-12-13 DIAGNOSIS — Z1389 Encounter for screening for other disorder: Secondary | ICD-10-CM | POA: Diagnosis not present

## 2018-12-13 DIAGNOSIS — M8588 Other specified disorders of bone density and structure, other site: Secondary | ICD-10-CM | POA: Diagnosis not present

## 2018-12-13 DIAGNOSIS — E7800 Pure hypercholesterolemia, unspecified: Secondary | ICD-10-CM | POA: Diagnosis not present

## 2018-12-13 DIAGNOSIS — M545 Low back pain: Secondary | ICD-10-CM | POA: Diagnosis not present

## 2018-12-13 DIAGNOSIS — E039 Hypothyroidism, unspecified: Secondary | ICD-10-CM | POA: Diagnosis not present

## 2018-12-13 DIAGNOSIS — Z0000 Encounter for general adult medical examination without abnormal findings: Secondary | ICD-10-CM | POA: Diagnosis not present

## 2018-12-27 DIAGNOSIS — H52203 Unspecified astigmatism, bilateral: Secondary | ICD-10-CM | POA: Diagnosis not present

## 2018-12-27 DIAGNOSIS — Z961 Presence of intraocular lens: Secondary | ICD-10-CM | POA: Diagnosis not present

## 2018-12-27 DIAGNOSIS — H04123 Dry eye syndrome of bilateral lacrimal glands: Secondary | ICD-10-CM | POA: Diagnosis not present

## 2018-12-27 DIAGNOSIS — H43813 Vitreous degeneration, bilateral: Secondary | ICD-10-CM | POA: Diagnosis not present

## 2019-02-22 ENCOUNTER — Ambulatory Visit: Payer: Medicare Other | Attending: Internal Medicine

## 2019-02-22 DIAGNOSIS — Z23 Encounter for immunization: Secondary | ICD-10-CM

## 2019-02-22 NOTE — Progress Notes (Signed)
   Covid-19 Vaccination Clinic  Name:  Toni Medina    MRN: [id number] DOB: 08/21/1931  02/22/2019  Ms. Toni Medina was observed post Covid-19 immunization for 15 minutes without incidence. She was provided with Vaccine Information Sheet and instruction to access the V-Safe system.   Ms. Toni Medina was instructed to call 911 with any severe reactions post vaccine: Marland Kitchen Difficulty breathing  . Swelling of your face and throat  . A fast heartbeat  . A bad rash all over your body  . Dizziness and weakness    Immunizations Administered    Name Date Dose VIS Date Route   Pfizer COVID-19 Vaccine 02/22/2019  1:54 PM 0.3 mL 01/14/2019 Intramuscular   Manufacturer: [employer], [employer]   Lot: [id number]   [place]: [id number]

## 2019-03-15 ENCOUNTER — Ambulatory Visit: Payer: Medicare Other | Attending: Internal Medicine

## 2019-03-15 ENCOUNTER — Ambulatory Visit: Payer: Medicare Other

## 2019-03-15 DIAGNOSIS — Z23 Encounter for immunization: Secondary | ICD-10-CM

## 2019-03-15 NOTE — Progress Notes (Signed)
   Covid-19 Vaccination Clinic  Name:  Toni Medina    MRN: [id number] DOB: [birth year]  03/15/2019  Toni Medina was observed post Covid-19 immunization for 15 minutes without incidence. She was provided with Vaccine Information Sheet and instruction to access the V-Safe system.   Toni Medina was instructed to call 911 with any severe reactions post vaccine: Marland Kitchen Difficulty breathing  . Swelling of your face and throat  . A fast heartbeat  . A bad rash all over your body  . Dizziness and weakness    Immunizations Administered    Name Date Dose VIS Date Route   Pfizer COVID-19 Vaccine 03/15/2019 10:50 AM 0.3 mL 01/14/2019 Intramuscular   Manufacturer: [place]   Lot: [id number]   [place]: [id number]

## 2019-07-29 ENCOUNTER — Other Ambulatory Visit: Payer: Self-pay | Admitting: Internal Medicine

## 2019-07-29 DIAGNOSIS — Z1231 Encounter for screening mammogram for malignant neoplasm of breast: Secondary | ICD-10-CM

## 2019-09-01 ENCOUNTER — Other Ambulatory Visit: Payer: Self-pay

## 2019-09-01 ENCOUNTER — Ambulatory Visit
Admission: RE | Admit: 2019-09-01 | Discharge: 2019-09-01 | Disposition: A | Discharge status: Home / Self Care | Payer: Medicare Other | Source: Ambulatory Visit | Attending: Internal Medicine | Admitting: Internal Medicine

## 2019-09-01 DIAGNOSIS — Z1231 Encounter for screening mammogram for malignant neoplasm of breast: Secondary | ICD-10-CM

## 2019-10-11 ENCOUNTER — Ambulatory Visit: Payer: Medicare Other | Attending: Internal Medicine

## 2019-10-11 DIAGNOSIS — Z23 Encounter for immunization: Secondary | ICD-10-CM

## 2019-10-11 NOTE — Progress Notes (Signed)
   Covid-19 Vaccination Clinic  Name:  Toni Medina    MRN: [id number] DOB: [birth year]  10/11/2019  Toni Medina was observed post Covid-19 immunization for 15 minutes without incident. She was provided with Vaccine Information Sheet and instruction to access the V-Safe system.   Toni Medina was instructed to call 911 with any severe reactions post vaccine: Marland Kitchen Difficulty breathing  . Swelling of face and throat  . A fast heartbeat  . A bad rash all over body  . Dizziness and weakness

## 2019-11-09 DIAGNOSIS — Z23 Encounter for immunization: Secondary | ICD-10-CM | POA: Diagnosis not present

## 2019-12-14 DIAGNOSIS — H43813 Vitreous degeneration, bilateral: Secondary | ICD-10-CM | POA: Diagnosis not present

## 2019-12-14 DIAGNOSIS — Z961 Presence of intraocular lens: Secondary | ICD-10-CM | POA: Diagnosis not present

## 2019-12-14 DIAGNOSIS — H52203 Unspecified astigmatism, bilateral: Secondary | ICD-10-CM | POA: Diagnosis not present

## 2019-12-14 DIAGNOSIS — H04123 Dry eye syndrome of bilateral lacrimal glands: Secondary | ICD-10-CM | POA: Diagnosis not present

## 2019-12-15 DIAGNOSIS — M8588 Other specified disorders of bone density and structure, other site: Secondary | ICD-10-CM | POA: Diagnosis not present

## 2019-12-15 DIAGNOSIS — M25569 Pain in unspecified knee: Secondary | ICD-10-CM | POA: Diagnosis not present

## 2019-12-15 DIAGNOSIS — Z0000 Encounter for general adult medical examination without abnormal findings: Secondary | ICD-10-CM | POA: Diagnosis not present

## 2019-12-15 DIAGNOSIS — E7800 Pure hypercholesterolemia, unspecified: Secondary | ICD-10-CM | POA: Diagnosis not present

## 2019-12-15 DIAGNOSIS — M5451 Vertebrogenic low back pain: Secondary | ICD-10-CM | POA: Diagnosis not present

## 2019-12-15 DIAGNOSIS — E039 Hypothyroidism, unspecified: Secondary | ICD-10-CM | POA: Diagnosis not present

## 2019-12-20 ENCOUNTER — Other Ambulatory Visit: Payer: Self-pay | Admitting: Internal Medicine

## 2019-12-20 DIAGNOSIS — M810 Age-related osteoporosis without current pathological fracture: Secondary | ICD-10-CM

## 2020-03-28 ENCOUNTER — Other Ambulatory Visit: Payer: Self-pay | Admitting: Internal Medicine

## 2020-03-28 DIAGNOSIS — M8580 Other specified disorders of bone density and structure, unspecified site: Secondary | ICD-10-CM

## 2020-04-02 ENCOUNTER — Other Ambulatory Visit: Payer: Medicare Other

## 2020-06-26 ENCOUNTER — Ambulatory Visit: Payer: Medicare Other | Attending: Internal Medicine

## 2020-06-26 ENCOUNTER — Other Ambulatory Visit: Payer: Self-pay

## 2020-06-26 ENCOUNTER — Other Ambulatory Visit (HOSPITAL_BASED_OUTPATIENT_CLINIC_OR_DEPARTMENT_OTHER): Payer: Self-pay

## 2020-06-26 DIAGNOSIS — Z23 Encounter for immunization: Secondary | ICD-10-CM

## 2020-06-26 MED ORDER — PFIZER-BIONT COVID-19 VAC-TRIS 30 MCG/0.3ML IM SUSP
INTRAMUSCULAR | 0 refills | Status: DC
  Filled 2020-06-26: qty 0.3, 1d supply, fill #0

## 2020-06-26 NOTE — Progress Notes (Signed)
   Covid-19 Vaccination Clinic  Name:  Toni Medina    MRN: [id number] DOB: [birth year]  06/26/2020  Toni Medina was observed post Covid-19 immunization for 15 minutes without incident. She was provided with Vaccine Information Sheet and instruction to access the V-Safe system.   Toni Medina was instructed to call 911 with any severe reactions post vaccine: Marland Kitchen Difficulty breathing  . Swelling of face and throat  . A fast heartbeat  . A bad rash all over body  . Dizziness and weakness   Immunizations Administered    Name Date Dose VIS Date Route   PFIZER Comrnaty(Gray TOP) Covid-19 Vaccine 06/26/2020 11:28 AM 0.3 mL 01/12/2020 Intramuscular   Manufacturer: [employer], [employer]   Lot: [id number]   [place]: [phone]

## 2020-07-11 DIAGNOSIS — H903 Sensorineural hearing loss, bilateral: Secondary | ICD-10-CM | POA: Diagnosis not present

## 2020-08-16 DIAGNOSIS — M25562 Pain in left knee: Secondary | ICD-10-CM | POA: Diagnosis not present

## 2020-08-17 ENCOUNTER — Other Ambulatory Visit: Payer: Self-pay | Admitting: Internal Medicine

## 2020-08-17 DIAGNOSIS — Z1231 Encounter for screening mammogram for malignant neoplasm of breast: Secondary | ICD-10-CM

## 2020-09-04 ENCOUNTER — Ambulatory Visit
Admission: RE | Admit: 2020-09-04 | Discharge: 2020-09-04 | Disposition: A | Discharge status: Home / Self Care | Payer: Medicare Other | Source: Ambulatory Visit | Attending: Internal Medicine | Admitting: Internal Medicine

## 2020-09-04 ENCOUNTER — Other Ambulatory Visit: Payer: Self-pay

## 2020-09-04 DIAGNOSIS — Z1231 Encounter for screening mammogram for malignant neoplasm of breast: Secondary | ICD-10-CM | POA: Diagnosis not present

## 2020-09-05 ENCOUNTER — Other Ambulatory Visit: Payer: Self-pay | Admitting: Internal Medicine

## 2020-09-05 ENCOUNTER — Other Ambulatory Visit: Payer: Self-pay | Admitting: Family Medicine

## 2020-09-05 DIAGNOSIS — Z1231 Encounter for screening mammogram for malignant neoplasm of breast: Secondary | ICD-10-CM

## 2020-09-07 ENCOUNTER — Ambulatory Visit
Admission: RE | Admit: 2020-09-07 | Discharge: 2020-09-07 | Disposition: A | Discharge status: Home / Self Care | Payer: Medicare Other | Source: Ambulatory Visit | Attending: Internal Medicine | Admitting: Internal Medicine

## 2020-09-07 ENCOUNTER — Other Ambulatory Visit: Payer: Self-pay

## 2020-09-07 DIAGNOSIS — Z780 Asymptomatic menopausal state: Secondary | ICD-10-CM | POA: Diagnosis not present

## 2020-09-07 DIAGNOSIS — M8580 Other specified disorders of bone density and structure, unspecified site: Secondary | ICD-10-CM

## 2020-09-10 ENCOUNTER — Other Ambulatory Visit: Payer: Self-pay

## 2020-09-10 ENCOUNTER — Ambulatory Visit (INDEPENDENT_AMBULATORY_CARE_PROVIDER_SITE_OTHER): Payer: Medicare Other | Admitting: Orthopaedic Surgery

## 2020-09-10 ENCOUNTER — Ambulatory Visit (INDEPENDENT_AMBULATORY_CARE_PROVIDER_SITE_OTHER): Payer: Medicare Other

## 2020-09-10 DIAGNOSIS — G8929 Other chronic pain: Secondary | ICD-10-CM | POA: Diagnosis not present

## 2020-09-10 DIAGNOSIS — M25562 Pain in left knee: Secondary | ICD-10-CM

## 2020-09-10 DIAGNOSIS — M1712 Unilateral primary osteoarthritis, left knee: Secondary | ICD-10-CM

## 2020-09-10 MED ORDER — LIDOCAINE HCL 1 % IJ SOLN
3.0000 mL | INTRAMUSCULAR | Status: AC | PRN
  Administered 2020-09-10: 3 mL

## 2020-09-10 MED ORDER — METHYLPREDNISOLONE ACETATE 40 MG/ML IJ SUSP
40.0000 mg | INTRAMUSCULAR | Status: AC | PRN
  Administered 2020-09-10: 40 mg via INTRA_ARTICULAR

## 2020-09-10 NOTE — Progress Notes (Signed)
Office Visit Note   Patient: Toni Medina           Date of Birth: 1931/09/16           MRN: [id number] Visit Date: 09/10/2020              Requested by: Seward Carol, MD [address] [place],  [place] [address] PCP: Seward Carol, MD   Assessment & Plan: Visit Diagnoses:  1. Chronic pain of left knee   2. Unilateral primary osteoarthritis, left knee     Plan: I was able to aspirate a small amount of fluid off of her left knee and then provided a steroid injection in the left knee.  I described this treatment plan to treat the arthritis of her left knee.  She would definitely benefit from outpatient physical therapy to strengthen her muscles with her knees and quads to help her navigate stairs better and her general balance and coordination.  I did explain that this is severe end-stage arthritis of both knees and at some point we may end up recommending a knee replacement if all else fails with conservative treatment.  All questions and concerns were answered and addressed.  We will see her back in 6 weeks to see how she is doing overall.  Follow-Up Instructions: Return in about 6 weeks (around 10/22/2020).   Orders:  Orders Placed This Encounter  Procedures   Large Joint Inj   XR Knee 1-2 Views Left   No orders of the defined types were placed in this encounter.     Procedures: Large Joint Inj: L knee on 09/10/2020 10:43 AM Indications: diagnostic evaluation and pain Details: 22 G 1.5 in needle, superolateral approach  Arthrogram: No  Medications: 3 mL lidocaine 1 %; 40 mg methylPREDNISolone acetate 40 MG/ML Outcome: tolerated well, no immediate complications Procedure, treatment alternatives, risks and benefits explained, specific risks discussed. Consent was given by the patient. Immediately prior to procedure a time out was called to verify the correct patient, procedure, equipment, support staff and site/side marked as required. Patient was prepped  and draped in the usual sterile fashion.      Clinical Data: No additional findings.   Subjective: Chief Complaint  Patient presents with   Left Knee - Pain  The patient is someone I am seeing for the first time.  She is with her son today.  She is 87 years old and has been having worsening left knee pain for many years now.  She is having trouble going up and down stairs and gets grinding in her knee.  She reports a remote history of a left knee arthroscopic intervention which was probably a medial meniscectomy.  She points the medial aspect of her knee and the patellofemoral joint as a source of her pain.  Her right knee does hurt on occasion as well.  Her son is with her today.  She does not walk with an assistive device.  She does not have significant active medical issues.  She is a thin individual.  She is active.  She is not a diabetic.  HPI  Review of Systems   Objective: Vital Signs: There were no vitals taken for this visit.  Physical Exam She is alert and oriented x3 and in no acute distress Ortho Exam Examination of both knees shows medial tenderness on the left knee and lateral tenderness on the right knee.  Both knees have patellofemoral crepitation.  The right knee has slight valgus  malalignment and the left knee has slight varus malalignment.  Both knees have significant patellofemoral crepitation. Specialty Comments:  No specialty comments available.  Imaging: XR Knee 1-2 Views Left  Result Date: 09/10/2020 2 views of the left knee standing also shows the right knee on the AP view.  Both knees are windswept with significant loss of the medial joint space on the left knee and significant lateral joint space loss on the right knee.  There is significant patellofemoral narrowing on the left knee.  This is end-stage tricompartmental arthritis.    PMFS History: Patient Active Problem List   Diagnosis Date Noted   Unilateral primary osteoarthritis, left knee  09/10/2020   No past medical history on file.  Family History  Problem Relation Age of Onset   CAD Mother 16   CAD Father 75    Past Surgical History:  Procedure Laterality Date   BREAST EXCISIONAL BIOPSY Left 2014   Social History   Occupational History   Not on file  Tobacco Use   Smoking status: Not on file   Smokeless tobacco: Not on file  Substance and Sexual Activity   Alcohol use: Not on file   Drug use: Not on file   Sexual activity: Not on file

## 2020-09-14 ENCOUNTER — Ambulatory Visit: Payer: Medicare Other | Attending: Orthopaedic Surgery

## 2020-09-14 ENCOUNTER — Other Ambulatory Visit: Payer: Self-pay

## 2020-09-14 DIAGNOSIS — M6281 Muscle weakness (generalized): Secondary | ICD-10-CM | POA: Insufficient documentation

## 2020-09-14 DIAGNOSIS — M25562 Pain in left knee: Secondary | ICD-10-CM | POA: Diagnosis not present

## 2020-09-14 DIAGNOSIS — R2681 Unsteadiness on feet: Secondary | ICD-10-CM | POA: Insufficient documentation

## 2020-09-14 DIAGNOSIS — M25561 Pain in right knee: Secondary | ICD-10-CM | POA: Diagnosis not present

## 2020-09-14 DIAGNOSIS — G8929 Other chronic pain: Secondary | ICD-10-CM | POA: Insufficient documentation

## 2020-09-14 NOTE — Therapy (Signed)
[place], [name], [address] Phone: [phone]   Fax:  [phone]  Physical Therapy Evaluation  Patient Details  Name: Toni Medina MRN: [id number] Date of Birth: [birth year] Referring Provider (PT): Mcarthur Rossetti   Encounter Date: 09/14/2020   PT End of Session - 09/14/20 1351     Visit Number 1    Number of Visits 8    Date for PT Re-Evaluation 11/09/20    Authorization Type MCR and BCBS    Authorization Time Period FOTO v6, 10    Progress Note Due on Visit 10    PT Start Time 1330    PT Stop Time 1415    PT Time Calculation (min) 45 min    Activity Tolerance Patient tolerated treatment well    Behavior During Therapy [facility] for tasks assessed/performed             History reviewed. No pertinent past medical history.  Past Surgical History:  Procedure Laterality Date   BREAST EXCISIONAL BIOPSY Left 2014    There were no vitals filed for this visit.    Subjective Assessment - 09/14/20 1318     Subjective Pt reports BIL L>R knee pain of insidious onset starting about 2.5 years ago. She attributes the increase in pain to not being as mobile since Covid-19 started. She reports that her meloxicam and acetopminophin helps with the pain, and she takes them first thing in the morning. She reports increased pain in the morning time than the rest of the day, but denies increased morning stiffness. She adds that she also takes Tylenol to "get her through the evening." She states that occasionally, if she "steps incorrectly," it can cause a sharp pain and mild buckling in her knee which requires her to sit down. She also denies any past knee injuries. She denies any N/T, knee locking, swelling, unexplained weight gain/ loss, nausea/ vomiting, or unrelenting night pain. She adds that she received a steroid injection in her L knee last Friday, although she has not had any relief from the  injection.    Limitations Walking;Standing;House hold activities    How long can you sit comfortably? Unlimited    How long can you stand comfortably? "not at all"    How long can you walk comfortably? "A few steps"    Diagnostic tests X-ray knee 09/10/2020: 2 views of the left knee standing also shows the right knee on the AP view.  Both knees are windswept with significant loss of the medial joint space on the left knee and significant lateral joint space loss on the right knee.  There is significant patellofemoral narrowing on the left knee.  This is end-stage tricompartmental arthritis.    Patient Stated Goals Shopping, cooking, baking    Currently in Pain? Yes    Pain Score 0-No pain    Pain Location Knee    Aggravating Factors  Walking, standing, bending, kneeling    Pain Relieving Factors pain medication, warm compress    Effect of Pain on Daily Activities Difficulty shopping, baking, cooking    Multiple Pain Sites No                [facility] PT Assessment - 09/14/20 0001       Assessment   Medical Diagnosis Chronic pain of left knee (M25.562, G89.29), Unilateral primary osteoarthritis, left knee (M17.12)    Referring Provider (PT) Jean Rosenthal Y    Onset Date/Surgical Date 09/15/18  Hand Dominance Right    Next MD Visit Unknown    Prior Therapy Yes      Precautions   Precautions None      Restrictions   Weight Bearing Restrictions No      Balance Screen   Has the patient fallen in the past 6 months No    Has the patient had a decrease in activity level because of a fear of falling?  No    Is the patient reluctant to leave their home because of a fear of falling?  No      Home Environment   Living Environment Private residence    Living Arrangements Alone    Available Help at Discharge Family;Neighbor    Type of [place] to enter    Entrance Stairs-Number of Steps 4    Entrance Stairs-Rails Right;Left;Can reach both    [place] One level    [place] Other (comment)   walking stick     Prior Function   Level of Independence Independent    Leisure Cooking, baking      Cognition   Overall Cognitive Status Within Functional Limits for tasks assessed      Observation/Other Assessments   Observations Pt's R knee is in a severe valgus position, L knee is in a moderate varus position; this is observed in standing and during gait.    Focus on Therapeutic Outcomes (FOTO)  43%, projected 60% by 13 visits      Functional Tests   Functional tests Squat;Step down;Step up      Squat   Comments 50%, mild posterior LOB and audible "squeek" in R knee      ROM / Strength   AROM / PROM / Strength AROM;PROM;Strength      AROM   Right Knee Extension -10    Right Knee Flexion 122    Left Knee Extension -2   with mild discomfort   Left Knee Flexion 125   with slight discomfort     PROM   Right Knee Extension -8    Right Knee Flexion 125   with slight discomfort   Left Knee Extension 0    Left Knee Flexion 123   With pain     Strength   Right Hip Flexion 4-/5    Right Hip Extension 3+/5    Right Hip ABduction 3/5    Left Hip Flexion 4-/5    Left Hip Extension 3+/5    Left Hip ABduction 3/5    Right Knee Flexion 5/5    Right Knee Extension 5/5    Left Knee Flexion 5/5    Left Knee Extension 5/5   With minor pain     Flexibility   Soft Tissue Assessment /Muscle Length yes    Quadriceps Severely limited BIL      Palpation   Patella mobility Crepitus in all planes BIL with mild discomfort    Palpation comment No joint line tenderness BIL      Special Tests    Special Tests Knee Special Tests    Knee Special tests  other;other2      other    Findings Negative    Comments Thessaly's at 0/20 degree knee flexion      other   findings Negative    Comments Apley's      Transfers   Transfers Sit to Stand    Five time sit to stand comments  18sec  Balance   Balance Assessed Yes       Static Standing Balance   Static Standing - Comment/# of Minutes Rhomberg x30sec WNL, Unable to complete tandem stance BIL                        Objective measurements completed on examination: See above findings.               PT Education - 09/14/20 1348     Education Details Pt educated on imaging findings, as well as probably sources of her pain. Also instructed to begin a walking program, provided in her HEP.    Person(s) Educated Patient    Methods Explanation;Handout;Verbal cues;Demonstration    Comprehension Verbalized understanding;Returned demonstration;Verbal cues required              PT Short Term Goals - 09/14/20 1352       PT SHORT TERM GOAL #1   Title Pt will report understanding and adherence to her HEP in order to promote independence in the management of her primary impairments.    Baseline HEP given at eval    Time 4    Period Weeks    Status New    Target Date 10/12/20               PT Long Term Goals - 09/14/20 1434       PT LONG TERM GOAL #1   Title Pt will achieve FOTO score of 60% in order to demonstrate improved functional ability as it relates to her knee pain.    Baseline 43%    Time 8    Period Weeks    Status New    Target Date 11/09/20      PT LONG TERM GOAL #2   Title Pt will achieve WNL BIL knee flexion and extension AROM with 0-1/10 pain in order to promote normal gait pattern.    Baseline pt limited in R knee extension and L knee flexion, pain with each    Time 8    Period Weeks    Status New    Target Date 11/09/20      PT LONG TERM GOAL #3   Title Pt will report ability to walk for >15 minutes with 0-1/10 pain in order to ambulate while grocery shopping without difficulty.    Baseline Unable to take a few steps without >5/10 pain.    Time 8    Period Weeks    Status New    Target Date 11/09/20      PT LONG TERM GOAL #4   Title Pt will achieve BIL tandem stance >15sec in order to  demonstrate improved functional balance.    Baseline Unable to perform either side due to LOB    Time 8    Period Weeks    Status New    Target Date 11/09/20                    Plan - 09/14/20 1427     Clinical Impression Statement Pt is a pleasant 87yo F who presents with primary c/o chronic BIL L>R knee pain lasting over 2 years. Upon assessment, the pt's primary impairments include severe R kne valgus and L knee varus, limited functional squat, impaired balance, limited R knee extension ROM, limited and painful L knee flexion ROM, weak global BIL hip MMT, painful L knee extension MMT, severely limited BIL quadriceps musculature, and crepitus with BIL  patellar passive accessories. Her sxs, as well as subjective hx, are most congruent with advanced BIL knee OA. This is supported by recent radiographic evidence. The pt will benefit from skilled PT and an independent walking program to address her primary impairments and return to her prior level of function with less limitation.    Personal Factors and Comorbidities Age    Examination-Activity Limitations Locomotion Level;Transfers;Bathing;Bend;Squat;Stairs;Stand    Examination-Participation Restrictions Cleaning;Laundry;Shop;Other   Cooking   Stability/Clinical Decision Making Stable/Uncomplicated    Clinical Decision Making Low    Rehab Potential Fair   Due to age and chronicity of sxs   PT Frequency 1x / week    PT Duration 8 weeks    PT Treatment/Interventions ADLs/Self Care Home Management;Aquatic Therapy;Cryotherapy;Moist Heat;DME Instruction;Manual techniques;Balance training;Therapeutic exercise;Therapeutic activities;Functional mobility training;Stair training;Gait training;Patient/family education;Passive range of motion    PT Next Visit Plan Progress LE strengthening, assess response to walking program, assess BERG    PT Home Exercise Plan [id number]    Consulted and Agree with Plan of Care Patient              Patient will benefit from skilled therapeutic intervention in order to improve the following deficits and impairments:  Abnormal gait, Difficulty walking, Decreased activity tolerance, Pain, Improper body mechanics, Decreased balance, Decreased mobility, Decreased range of motion, Decreased strength  Visit Diagnosis: Chronic pain of left knee  Chronic pain of right knee  Unsteadiness on feet  Muscle weakness (generalized)     Problem List Patient Active Problem List   Diagnosis Date Noted   Unilateral primary osteoarthritis, left knee 09/10/2020    Vanessa McMurray, PT, DPT 09/14/20 2:39 PM   [facility] [address] [place], [name], [address] Phone: [phone]   Fax:  [phone]  Name: Toni Medina MRN: [id number] Date of Birth: 1931/11/17

## 2020-09-14 NOTE — Patient Instructions (Signed)
   [id number]

## 2020-09-19 ENCOUNTER — Ambulatory Visit: Payer: Medicare Other

## 2020-09-19 ENCOUNTER — Other Ambulatory Visit: Payer: Self-pay

## 2020-09-19 DIAGNOSIS — G8929 Other chronic pain: Secondary | ICD-10-CM

## 2020-09-19 DIAGNOSIS — R2681 Unsteadiness on feet: Secondary | ICD-10-CM

## 2020-09-19 DIAGNOSIS — M25562 Pain in left knee: Secondary | ICD-10-CM

## 2020-09-19 DIAGNOSIS — M6281 Muscle weakness (generalized): Secondary | ICD-10-CM

## 2020-09-19 NOTE — Therapy (Signed)
[place], [name], [address] Phone: [phone]   Fax:  [phone]  Physical Therapy Treatment  Patient Details  Name: Toni Medina MRN: [id number] Date of Birth: 10/28/1931 Referring Provider (PT): Mcarthur Rossetti   Encounter Date: 09/19/2020   PT End of Session - 09/19/20 1310     Visit Number 2    Number of Visits 8    Date for PT Re-Evaluation 11/09/20    Authorization Type MCR and BCBS    Authorization Time Period FOTO v6, 10    Progress Note Due on Visit 10    PT Start Time 1150    PT Stop Time 1235    PT Time Calculation (min) 45 min    Activity Tolerance Patient tolerated treatment well    Behavior During Therapy [facility] for tasks assessed/performed             History reviewed. No pertinent past medical history.  Past Surgical History:  Procedure Laterality Date   BREAST EXCISIONAL BIOPSY Left 2014    There were no vitals filed for this visit.   Subjective Assessment - 09/19/20 1306     Subjective Pt reports consistent completion of her HEP. Her walking time is 5 mins, 3x per day She indicates knee dicomfort with the wt. bearing c standing Tband exs.    Diagnostic tests X-ray knee 09/10/2020: 2 views of the left knee standing also shows the right knee on the AP view.  Both knees are windswept with significant loss of the medial joint space on the left knee and significant lateral joint space loss on the right knee.  There is significant patellofemoral narrowing on the left knee.  This is end-stage tricompartmental arthritis.    Patient Stated Goals Shopping, cooking, baking    Currently in Pain? Yes    Pain Score 3     Pain Location Knee    Pain Orientation Right;Left    Pain Descriptors / Indicators Aching    Pain Type Chronic pain    Pain Onset More than a month ago    Pain Frequency Intermittent                               [facility] Adult PT  Treatment/Exercise - 09/19/20 0001       Ambulation/Gait   Gait Comments Pt walks with forward flexed trunk. VC to stand more upright as tolerated.      Exercises   Exercises Knee/Hip      Knee/Hip Exercises: Standing   Other Standing Knee Exercises 3way hip, L and R, yellow pain. Report of discomfort with stance legs      Knee/Hip Exercises: Seated   Long Arc Quad Right;Left;2 sets;10 reps    Long Arc Quad Limitations yellow Tband      Knee/Hip Exercises: Supine   Bridges Both;10 reps    Straight Leg Raises Right;Left;15 reps    Straight Leg Raises Limitations qs prior to SLR      Knee/Hip Exercises: Sidelying   Hip ABduction Right;Left;15 reps                    PT Education - 09/19/20 1309     Education Details HEP update.    Person(s) Educated Patient    Methods Explanation;Demonstration;Tactile cues;Verbal cues;Handout    Comprehension Verbalized understanding;Returned demonstration;Verbal cues required;Tactile cues required  PT Short Term Goals - 09/14/20 1352       PT SHORT TERM GOAL #1   Title Pt will report understanding and adherence to her HEP in order to promote independence in the management of her primary impairments.    Baseline HEP given at eval    Time 4    Period Weeks    Status New    Target Date 10/12/20               PT Long Term Goals - 09/14/20 1434       PT LONG TERM GOAL #1   Title Pt will achieve FOTO score of 60% in order to demonstrate improved functional ability as it relates to her knee pain.    Baseline 43%    Time 8    Period Weeks    Status New    Target Date 11/09/20      PT LONG TERM GOAL #2   Title Pt will achieve WNL BIL knee flexion and extension AROM with 0-1/10 pain in order to promote normal gait pattern.    Baseline pt limited in R knee extension and L knee flexion, pain with each    Time 8    Period Weeks    Status New    Target Date 11/09/20      PT LONG TERM GOAL #3   Title  Pt will report ability to walk for >15 minutes with 0-1/10 pain in order to ambulate while grocery shopping without difficulty.    Baseline Unable to take a few steps without >5/10 pain.    Time 8    Period Weeks    Status New    Target Date 11/09/20      PT LONG TERM GOAL #4   Title Pt will achieve BIL tandem stance >15sec in order to demonstrate improved functional balance.    Baseline Unable to perform either side due to LOB    Time 8    Period Weeks    Status New    Target Date 11/09/20                   Plan - 09/19/20 1311     Clinical Impression Statement Pt reports consistent completion of her HEP. PT was completed for knee and hip strengthening with both open and closed chain exs. Pt walks with a forward flexed posture associated with loss of lumbar lordosis and decreased hip ext strength. Bridging and VC for a more upright posture were utlized to address posture. Pt voiced understanding of purpose for each. Pt tolerated today's PT session without adverse effects. Pt will benefit continued PT to address strength and ROM defcicits and pain to optimize her functional mobility.    Personal Factors and Comorbidities Age    Examination-Activity Limitations Locomotion Level;Transfers;Bathing;Bend;Squat;Stairs;Stand    Examination-Participation Restrictions Cleaning;Laundry;Shop;Other    Stability/Clinical Decision Making Stable/Uncomplicated    Clinical Decision Making Low    Rehab Potential Fair    PT Frequency 1x / week    PT Duration 8 weeks    PT Treatment/Interventions ADLs/Self Care Home Management;Aquatic Therapy;Cryotherapy;Moist Heat;DME Instruction;Manual techniques;Balance training;Therapeutic exercise;Therapeutic activities;Functional mobility training;Stair training;Gait training;Patient/family education;Passive range of motion    PT Next Visit Plan Progress LE strengthening, assess response to walking program, assess BERG. Add flexibility exs as indicated.     PT Home Exercise Plan [id number]    Consulted and Agree with Plan of Care Patient  Patient will benefit from skilled therapeutic intervention in order to improve the following deficits and impairments:  Abnormal gait, Difficulty walking, Decreased activity tolerance, Pain, Improper body mechanics, Decreased balance, Decreased mobility, Decreased range of motion, Decreased strength  Visit Diagnosis: Chronic pain of left knee  Chronic pain of right knee  Unsteadiness on feet  Muscle weakness (generalized)     Problem List Patient Active Problem List   Diagnosis Date Noted   Unilateral primary osteoarthritis, left knee 09/10/2020   Gar Ponto MS, PT 09/19/20 10:02 PM   [facility] [address] [place], [name], [address] Phone: [phone]   Fax:  [phone]  Name: Toni Medina MRN: [id number] Date of Birth: 31-Oct-1931

## 2020-09-26 ENCOUNTER — Encounter: Payer: Self-pay | Admitting: Physical Therapy

## 2020-09-26 ENCOUNTER — Other Ambulatory Visit: Payer: Self-pay

## 2020-09-26 ENCOUNTER — Ambulatory Visit: Payer: Medicare Other | Admitting: Physical Therapy

## 2020-09-26 DIAGNOSIS — M6281 Muscle weakness (generalized): Secondary | ICD-10-CM

## 2020-09-26 DIAGNOSIS — G8929 Other chronic pain: Secondary | ICD-10-CM

## 2020-09-26 DIAGNOSIS — M25562 Pain in left knee: Secondary | ICD-10-CM | POA: Diagnosis not present

## 2020-09-26 DIAGNOSIS — M25561 Pain in right knee: Secondary | ICD-10-CM

## 2020-09-26 DIAGNOSIS — R2681 Unsteadiness on feet: Secondary | ICD-10-CM

## 2020-09-26 NOTE — Therapy (Signed)
[place]oni Med[place], [name], [address] Phone: [phone]   Fax:  [phone]  Physical Therapy Treatment  Patient Details  Name: Toni Medina MRN: [id number] Date of Birth: 07-20-1931 Referring Provider (PT): Toni Medina   Encounter Date: 09/26/2020   PT End of Session - 09/26/20 1307     Visit Number 3    Number of Visits 8    Date for PT Re-Evaluation 11/09/20    Authorization Type MCR and BCBS    Authorization Time Period FOTO v6, 10    PT Start Time 1236    PT Stop Time 1315    PT Time Calculation (min) 39 min             History reviewed. No pertinent past medical history.  Past Surgical History:  Procedure Laterality Date   BREAST EXCISIONAL BIOPSY Left 2014    There were no vitals filed for this visit.   Subjective Assessment - 09/26/20 1239     Subjective Pt reports she shops for grocerries every week for one hour. She has no knee pain on arrival. Left knee pain gets worse with ambulation so she uses a grocerry cart to assist her.    Currently in Pain? No/denies                [facility] PT Assessment - 09/26/20 0001       Standardized Balance Assessment   Standardized Balance Assessment Berg Balance Test      Berg Balance Test   Sit to Stand Able to stand without using hands and stabilize independently    Standing Unsupported Able to stand safely 2 minutes    Sitting with Back Unsupported but Feet Supported on Floor or Stool Able to sit safely and securely 2 minutes    Stand to Sit Sits safely with minimal use of hands    Transfers Able to transfer safely, minor use of hands    Standing Unsupported with Eyes Closed Able to stand 10 seconds safely    Standing Unsupported with Feet Together Able to place feet together independently and stand 1 minute safely    From Standing, Reach Forward with Outstretched Arm Can reach confidently >25 cm (10")    From Standing  Position, Pick up Object from Floor Able to pick up shoe safely and easily    From Standing Position, Turn to Look Behind Over each Shoulder Looks behind from both sides and weight shifts well    Turn 360 Degrees Able to turn 360 degrees safely one side only in 4 seconds or less    Standing Unsupported, Alternately Place Feet on Step/Stool Able to stand independently and safely and complete 8 steps in 20 seconds    Standing Unsupported, One Foot in Front Able to plae foot ahead of the other independently and hold 30 seconds    Standing on One Leg Tries to lift leg/unable to hold 3 seconds but remains standing independently    Total Score 51    Berg comment: 51/56                                     PT Short Term Goals - 09/14/20 1352       PT SHORT TERM GOAL #1   Title Pt will report understanding and adherence to her HEP in order to promote independence in the management of her primary  impairments.    Baseline HEP given at eval    Time 4    Period Weeks    Status New    Target Date 10/12/20               PT Long Term Goals - 09/14/20 1434       PT LONG TERM GOAL #1   Title Pt will achieve FOTO score of 60% in order to demonstrate improved functional ability as it relates to her knee pain.    Baseline 43%    Time 8    Period Weeks    Status New    Target Date 11/09/20      PT LONG TERM GOAL #2   Title Pt will achieve WNL BIL knee flexion and extension AROM with 0-1/10 pain in order to promote normal gait pattern.    Baseline pt limited in R knee extension and L knee flexion, pain with each    Time 8    Period Weeks    Status New    Target Date 11/09/20      PT LONG TERM GOAL #3   Title Pt will report ability to walk for >15 minutes with 0-1/10 pain in order to ambulate while grocery shopping without difficulty.    Baseline Unable to take a few steps without >5/10 pain.    Time 8    Period Weeks    Status New    Target Date 11/09/20       PT LONG TERM GOAL #4   Title Pt will achieve BIL tandem stance >15sec in order to demonstrate improved functional balance.    Baseline Unable to perform either side due to LOB    Time 8    Period Weeks    Status New    Target Date 11/09/20                   Plan - 09/26/20 1339     Clinical Impression Statement Toni Medina arrives to PT and loses balance while reaching for hand sanitizer and is able to self correct to prevent fall. Toni Medina reports compliance with HEP however needs cues for seated therex with bands. BERG captured at 51/56. She reports walking for 5 mnutes at a time in home without AD but with increasing knee and back pain. Encouraged use of SPC to assist with LLE pain, balance and upright posture. Gait traiined in clinic with [facility] and pt reported improved ability to maintain upright posture.    PT Treatment/Interventions ADLs/Self Care Home Management;Aquatic Therapy;Cryotherapy;Moist Heat;DME Instruction;Manual techniques;Balance training;Therapeutic exercise;Therapeutic activities;Functional mobility training;Stair training;Gait training;Patient/family education;Passive range of motion    PT Next Visit Plan Progress LE strengthening, assess response to walking program, balance. Add flexibility exs as indicated.    PT Home Exercise Plan [facility]             Patient will benefit from skilled therapeutic intervention in order to improve the following deficits and impairments:  Abnormal gait, Difficulty walking, Decreased activity tolerance, Pain, Improper body mechanics, Decreased balance, Decreased mobility, Decreased range of motion, Decreased strength  Visit Diagnosis: Chronic pain of left knee  Chronic pain of right knee  Unsteadiness on feet  Muscle weakness (generalized)     Problem List Patient Active Problem List   Diagnosis Date Noted   Unilateral primary osteoarthritis, left knee 09/10/2020    Dorene Ar, PTA 09/26/2020, 1:45  PM  [facility] [address] [place], [name], [address]  Phone: [phone]   Fax:  [phone]  Name: Toni Medina MRN: [id number] Date of Birth: [birth year]

## 2020-10-03 ENCOUNTER — Encounter: Payer: Self-pay | Admitting: Physical Therapy

## 2020-10-03 ENCOUNTER — Other Ambulatory Visit: Payer: Self-pay

## 2020-10-03 ENCOUNTER — Ambulatory Visit: Payer: Medicare Other | Admitting: Physical Therapy

## 2020-10-03 DIAGNOSIS — M25562 Pain in left knee: Secondary | ICD-10-CM | POA: Diagnosis not present

## 2020-10-03 DIAGNOSIS — M6281 Muscle weakness (generalized): Secondary | ICD-10-CM

## 2020-10-03 DIAGNOSIS — G8929 Other chronic pain: Secondary | ICD-10-CM | POA: Diagnosis not present

## 2020-10-03 DIAGNOSIS — R2681 Unsteadiness on feet: Secondary | ICD-10-CM | POA: Diagnosis not present

## 2020-10-03 DIAGNOSIS — M25561 Pain in right knee: Secondary | ICD-10-CM | POA: Diagnosis not present

## 2020-10-03 NOTE — Therapy (Signed)
[place], [name], [address] Phone: [phone]   Fax:  [phone]  Physical Therapy Treatment  Patient Details  Name: Toni Medina MRN: [id number] Date of Birth: [birth year] Referring Provider (PT): Toni Medina   Encounter Date: 10/03/2020   PT End of Session - 10/03/20 1121     Visit Number 4    Number of Visits 8    Date for PT Re-Evaluation 11/09/20    Authorization Type MCR and BCBS    Authorization Time Period FOTO v6, 10    Progress Note Due on Visit 10    PT Start Time 1107    PT Stop Time 1145    PT Time Calculation (min) 38 min             History reviewed. No pertinent past medical history.  Past Surgical History:  Procedure Laterality Date   BREAST EXCISIONAL BIOPSY Left 2014    There were no vitals filed for this visit.   Subjective Assessment - 10/03/20 1120     Subjective Toni Medina arrives reporting no pain and with SPC.    Diagnostic tests X-ray knee 09/10/2020: 2 views of the left knee standing also shows the right knee on the AP view.  Both knees are windswept with significant loss of the medial joint space on the left knee and significant lateral joint space loss on the right knee.  There is significant patellofemoral narrowing on the left knee.  This is end-stage tricompartmental arthritis.    Patient Stated Goals Shopping, cooking, baking    Currently in Pain? No/denies                               [facility] Adult PT Treatment/Exercise - 10/03/20 0001       Ambulation/Gait   Gait Comments gait with SPC with cues for opeing anterior hip with forward weight shift, heel strike, posture      Knee/Hip Exercises: Stretches   Sports administrator Limitations prone with strap  and passively 2 x 30 sec bilat    Hip Flexor Stretch Limitations mod thomas with strap 3 x 30 sec each    Other Knee/Hip Stretches single and double knee to chest  intermittently after extension sretches      Knee/Hip Exercises: Standing   Other Standing Knee Exercises facing wall - step and weight shift forward with opposite arm slide up wall 5 x 2 each side-      Knee/Hip Exercises: Supine   Bridges Both;10 reps    Bridges Limitations ROm improved after stretching                      PT Short Term Goals - 09/14/20 1352       PT SHORT TERM GOAL #1   Title Pt will report understanding and adherence to her HEP in order to promote independence in the management of her primary impairments.    Baseline HEP given at eval    Time 4    Period Weeks    Status New    Target Date 10/12/20               PT Long Term Goals - 09/14/20 1434       PT LONG TERM GOAL #1   Title Pt will achieve FOTO score of 60% in order to demonstrate improved functional ability as it relates to her knee  pain.    Baseline 43%    Time 8    Period Weeks    Status New    Target Date 11/09/20      PT LONG TERM GOAL #2   Title Pt will achieve WNL BIL knee flexion and extension AROM with 0-1/10 pain in order to promote normal gait pattern.    Baseline pt limited in R knee extension and L knee flexion, pain with each    Time 8    Period Weeks    Status New    Target Date 11/09/20      PT LONG TERM GOAL #3   Title Pt will report ability to walk for >15 minutes with 0-1/10 pain in order to ambulate while grocery shopping without difficulty.    Baseline Unable to take a few steps without >5/10 pain.    Time 8    Period Weeks    Status New    Target Date 11/09/20      PT LONG TERM GOAL #4   Title Pt will achieve BIL tandem stance >15sec in order to demonstrate improved functional balance.    Baseline Unable to perform either side due to LOB    Time 8    Period Weeks    Status New    Target Date 11/09/20                   Plan - 10/03/20 1201     Clinical Impression Statement Toni Medina arrives with [facility] and reports improved ability to  maintain upright posture. Her cane was adjusted 2 more more levels to promote correct posture. Worked on gait training and focusing on opening anterior hip with forward weight shifting. Used wall to activate gluteal with step and reach. Further worked on extension with quad and hp flexor stretches. Some lumbar aggracation reported which was relieved with intermittent knee to chest stretching. Pt reqested addition of strethes to HEP. Pt is eager to continue working on posterior chain strength and anterior stretching.    PT Next Visit Plan Progress LE strengthening, assess response to walking program, balance. assess response to  hip flexo /quad flexibility exs added, posterior chain strength    PT Home Exercise Plan [facility]             Patient will benefit from skilled therapeutic intervention in order to improve the following deficits and impairments:  Abnormal gait, Difficulty walking, Decreased activity tolerance, Pain, Improper body mechanics, Decreased balance, Decreased mobility, Decreased range of motion, Decreased strength  Visit Diagnosis: Chronic pain of left knee  Chronic pain of right knee  Muscle weakness (generalized)  Unsteadiness on feet     Problem List Patient Active Problem List   Diagnosis Date Noted   Unilateral primary osteoarthritis, left knee 09/10/2020    Dorene Ar, PTA 10/03/2020, 12:32 PM  [facility] [address] [place], [name], [address] Phone: [phone]   Fax:  [phone]  Name: Toni Medina MRN: [id number] Date of Birth: 30-Jun-1931

## 2020-10-10 ENCOUNTER — Ambulatory Visit: Payer: Medicare Other | Attending: Orthopaedic Surgery

## 2020-10-10 ENCOUNTER — Other Ambulatory Visit: Payer: Self-pay

## 2020-10-10 DIAGNOSIS — M25562 Pain in left knee: Secondary | ICD-10-CM | POA: Diagnosis not present

## 2020-10-10 DIAGNOSIS — M6281 Muscle weakness (generalized): Secondary | ICD-10-CM | POA: Diagnosis not present

## 2020-10-10 DIAGNOSIS — R2681 Unsteadiness on feet: Secondary | ICD-10-CM | POA: Insufficient documentation

## 2020-10-10 DIAGNOSIS — M25561 Pain in right knee: Secondary | ICD-10-CM | POA: Insufficient documentation

## 2020-10-10 DIAGNOSIS — G8929 Other chronic pain: Secondary | ICD-10-CM | POA: Diagnosis not present

## 2020-10-10 NOTE — Therapy (Signed)
[place], [name], [address] Phone: [phone]   Fax:  [phone]  Physical Therapy Treatment  Patient Details  Name: Toni Medina MRN: [id number] Date of Birth: Sep 21, 1931 Referring Provider (PT): Mcarthur Rossetti   Encounter Date: 10/10/2020   PT End of Session - 10/10/20 1114     Visit Number 5    Number of Visits 8    Date for PT Re-Evaluation 11/09/20    Authorization Type MCR and BCBS    Authorization Time Period FOTO v6, 10    Progress Note Due on Visit 10    PT Start Time 1101    PT Stop Time 1130    PT Time Calculation (min) 29 min    Activity Tolerance Patient tolerated treatment well    Behavior During Therapy [facility] for tasks assessed/performed             History reviewed. No pertinent past medical history.  Past Surgical History:  Procedure Laterality Date   BREAST EXCISIONAL BIOPSY Left 2014    There were no vitals filed for this visit.   Subjective Assessment - 10/10/20 1102     Subjective Pt arrives 15 minutes late to her appointment. She reports walking daily, but is irregular with adherence of her HEP. She reports that long-ditance walking > 5-10 minutes is difficult due to pain.    Currently in Pain? Yes    Pain Score 3     Pain Location Knee    Pain Orientation Left    Pain Descriptors / Indicators Discomfort    Pain Type Chronic pain                               [facility] Adult PT Treatment/Exercise - 10/10/20 0001       Knee/Hip Exercises: Stretches   Sports administrator Limitations prone with strap  and passively 2 x 30 sec bilat      Knee/Hip Exercises: Aerobic   Stationary Bike x3 minutes, level 3 resistance at self-selected pace      Knee/Hip Exercises: Machines for Strengthening   Cybex Knee Extension Eccentric (2 up, 1 down) 2x8 with 5# cable    Cybex Leg Press Eccentric (2 up, 1 down) 2x8 BIL with 20#                   Upper Extremity Functional Index Score :   /80     PT Short Term Goals - 09/14/20 1352       PT SHORT TERM GOAL #1   Title Pt will report understanding and adherence to her HEP in order to promote independence in the management of her primary impairments.    Baseline HEP given at eval    Time 4    Period Weeks    Status New    Target Date 10/12/20               PT Long Term Goals - 09/14/20 1434       PT LONG TERM GOAL #1   Title Pt will achieve FOTO score of 60% in order to demonstrate improved functional ability as it relates to her knee pain.    Baseline 43%    Time 8    Period Weeks    Status New    Target Date 11/09/20      PT LONG TERM GOAL #2   Title Pt will achieve  WNL BIL knee flexion and extension AROM with 0-1/10 pain in order to promote normal gait pattern.    Baseline pt limited in R knee extension and L knee flexion, pain with each    Time 8    Period Weeks    Status New    Target Date 11/09/20      PT LONG TERM GOAL #3   Title Pt will report ability to walk for >15 minutes with 0-1/10 pain in order to ambulate while grocery shopping without difficulty.    Baseline Unable to take a few steps without >5/10 pain.    Time 8    Period Weeks    Status New    Target Date 11/09/20      PT LONG TERM GOAL #4   Title Pt will achieve BIL tandem stance >15sec in order to demonstrate improved functional balance.    Baseline Unable to perform either side due to LOB    Time 8    Period Weeks    Status New    Target Date 11/09/20                   Plan - 10/10/20 1115     Clinical Impression Statement Pt arrived 15 minutes late to her appointment, which let to a truncated session. She responded well to all interventions completed with proper form and without increase in pain. She reports focal knee pain with lateral heel taps, so this intervention was discontinued after 3 reps. She did well with eccentric quadriceps loading today and  reports that the exercises focused on her quad muscles without increasing knee pain. She will continue to benefit from skilled PT to address her primary impairments and return to her prior level of function with less limitation.    Personal Factors and Comorbidities Age    Examination-Activity Limitations Locomotion Level;Transfers;Bathing;Bend;Squat;Stairs;Stand    Examination-Participation Restrictions Cleaning;Laundry;Shop;Other    Stability/Clinical Decision Making Stable/Uncomplicated    Clinical Decision Making Low    Rehab Potential Fair    PT Frequency 1x / week    PT Duration 8 weeks    PT Treatment/Interventions ADLs/Self Care Home Management;Aquatic Therapy;Cryotherapy;Moist Heat;DME Instruction;Manual techniques;Balance training;Therapeutic exercise;Therapeutic activities;Functional mobility training;Stair training;Gait training;Patient/family education;Passive range of motion    PT Next Visit Plan Progress LE strengthening, assess response to walking program, balance. assess response to  hip flexo /quad flexibility exs added, posterior chain strength    PT Home Exercise Plan [id number]    Consulted and Agree with Plan of Care Patient             Patient will benefit from skilled therapeutic intervention in order to improve the following deficits and impairments:  Abnormal gait, Difficulty walking, Decreased activity tolerance, Pain, Improper body mechanics, Decreased balance, Decreased mobility, Decreased range of motion, Decreased strength  Visit Diagnosis: Chronic pain of left knee  Chronic pain of right knee  Muscle weakness (generalized)  Unsteadiness on feet     Problem List Patient Active Problem List   Diagnosis Date Noted   Unilateral primary osteoarthritis, left knee 09/10/2020    Vanessa , PT, DPT 10/10/20 11:29 AM   [facility] [address] [place], [name], [address] Phone: [phone]    Fax:  [phone]  Name: Toni Medina MRN: [id number] Date of Birth: [birth year]

## 2020-10-17 ENCOUNTER — Encounter: Payer: Self-pay | Admitting: Physical Therapy

## 2020-10-17 ENCOUNTER — Other Ambulatory Visit: Payer: Self-pay

## 2020-10-17 ENCOUNTER — Ambulatory Visit: Payer: Medicare Other | Admitting: Physical Therapy

## 2020-10-17 DIAGNOSIS — R2681 Unsteadiness on feet: Secondary | ICD-10-CM

## 2020-10-17 DIAGNOSIS — M25561 Pain in right knee: Secondary | ICD-10-CM | POA: Diagnosis not present

## 2020-10-17 DIAGNOSIS — M6281 Muscle weakness (generalized): Secondary | ICD-10-CM

## 2020-10-17 DIAGNOSIS — M25562 Pain in left knee: Secondary | ICD-10-CM

## 2020-10-17 DIAGNOSIS — G8929 Other chronic pain: Secondary | ICD-10-CM | POA: Diagnosis not present

## 2020-10-17 NOTE — Therapy (Signed)
[place], [name], [address] Phone: [phone]   Fax:  [phone]  Physical Therapy Treatment  Patient Details  Name: Toni Medina MRN: [id number] Date of Birth: 07-29-1931 Referring Provider (PT): Mcarthur Rossetti   Encounter Date: 10/17/2020   PT End of Session - 10/17/20 1112     Visit Number 6    Number of Visits 8    Date for PT Re-Evaluation 11/09/20    Authorization Type MCR and BCBS    Authorization Time Period FOTO v6(captured 10/17/20), v10    Progress Note Due on Visit 10    PT Start Time 1100    PT Stop Time 1145    PT Time Calculation (min) 45 min    Activity Tolerance Patient tolerated treatment well    Behavior During Therapy [facility] for tasks assessed/performed             History reviewed. No pertinent past medical history.  Past Surgical History:  Procedure Laterality Date   BREAST EXCISIONAL BIOPSY Left 2014    There were no vitals filed for this visit.   Subjective Assessment - 10/17/20 1103     Subjective So far so good this morning. My pain in the knees is not getting worse. The longer I walk the more pain I have.    Currently in Pain? Yes    Pain Score 2     Pain Location Knee    Pain Orientation Left;Right    Pain Descriptors / Indicators Discomfort    Pain Type Chronic pain    Aggravating Factors  walking , standing, bending    Pain Relieving Factors pain  meds , warm compress                [facility] PT Assessment - 10/17/20 0001       Observation/Other Assessments   Focus on Therapeutic Outcomes (FOTO)  49%      AROM   Right Knee Extension -10    Right Knee Flexion 130    Left Knee Extension -5    Left Knee Flexion 130                           [facility] Adult PT Treatment/Exercise - 10/17/20 0001       Knee/Hip Exercises: Stretches   Active Hamstring Stretch Limitations supine with srap    Hip Flexor Stretch  Limitations mod thomas      Knee/Hip Exercises: Seated   Sit to Sand 20 reps   mod -max cues for technique     Knee/Hip Exercises: Supine   Bridges Both;10 reps    Bridges Limitations bridge with feet on ball x 10   x 10 with arms crossed- difficult                      PT Short Term Goals - 10/17/20 1137       PT SHORT TERM GOAL #1   Title Pt will report understanding and adherence to her HEP in order to promote independence in the management of her primary impairments.    Baseline Pt reports min compliance    Time 4    Period Weeks    Status On-going    Target Date 10/12/20               PT Long Term Goals - 10/17/20 1158       PT LONG  TERM GOAL #1   Title Pt will achieve FOTO score of 60% in order to demonstrate improved functional ability as it relates to her knee pain.    Baseline 43%; 10/17/20: 49%    Time 8    Period Weeks    Status On-going      PT LONG TERM GOAL #2   Title Pt will achieve WNL BIL knee flexion and extension AROM with 0-1/10 pain in order to promote normal gait pattern.    Baseline Eval: pt limited in R knee extension and L knee flexion, pain with each; 10/17/20: no pain with bilat knee flexion but pain with full extension    Time 8    Period Weeks    Status On-going      PT LONG TERM GOAL #3   Title Pt will report ability to walk for >15 minutes with 0-1/10 pain in order to ambulate while grocery shopping without difficulty.    Baseline Eval: Unable to take a few steps without >5/10 pain 10/17/20: needs cart in store, can cook and do house work as needed with rest breaks and [place] of UE on counter    Time 8    Period Weeks    Status On-going      PT LONG TERM GOAL #4   Title Pt will achieve BIL tandem stance >15sec in order to demonstrate improved functional balance.    Baseline eval: Unable to perform either side due to LOB    Time 8    Period Weeks    Status Unable to assess                   Plan - 10/17/20 1137      Clinical Impression Statement Pt reports overall she is about the same , maybe minimal improvement in knee pain. She is min compliant with HEP.  She relies heavily on arms to provide trunk support on knees, counter or cart. She reports min compliance with HEP. Her knee flexion ROM has improved however knee extensio remains limited, and also complimented by her flexed trunk posture. Contued with gluteal activation today and added hamstring stretching. FOTO score status captured and improved 6%. She requested updated HEP. Pt was encouraged to be compliant with HEP to progress toward goals.    PT Next Visit Plan Progress LE strengthening, assess response to walking program, add tandem balance. assess response to  hip flexo /quad flexibility exs added, posterior chain strength    PT Home Exercise Plan [facility]             Patient will benefit from skilled therapeutic intervention in order to improve the following deficits and impairments:  Abnormal gait, Difficulty walking, Decreased activity tolerance, Pain, Improper body mechanics, Decreased balance, Decreased mobility, Decreased range of motion, Decreased strength  Visit Diagnosis: Chronic pain of left knee  Chronic pain of right knee  Muscle weakness (generalized)  Unsteadiness on feet     Problem List Patient Active Problem List   Diagnosis Date Noted   Unilateral primary osteoarthritis, left knee 09/10/2020    Dorene Ar, PTA 10/17/2020, 12:27 PM  [place] [facility] [address] [place], [name], [address] Phone: [phone]   Fax:  [phone]  Name: Toni Medina MRN: [id number] Date of Birth: [birth year]

## 2020-10-22 ENCOUNTER — Ambulatory Visit (INDEPENDENT_AMBULATORY_CARE_PROVIDER_SITE_OTHER): Payer: Medicare Other | Admitting: Orthopaedic Surgery

## 2020-10-22 ENCOUNTER — Encounter: Payer: Self-pay | Admitting: Orthopaedic Surgery

## 2020-10-22 ENCOUNTER — Other Ambulatory Visit: Payer: Self-pay

## 2020-10-22 DIAGNOSIS — G8929 Other chronic pain: Secondary | ICD-10-CM | POA: Diagnosis not present

## 2020-10-22 DIAGNOSIS — M1711 Unilateral primary osteoarthritis, right knee: Secondary | ICD-10-CM

## 2020-10-22 DIAGNOSIS — M1712 Unilateral primary osteoarthritis, left knee: Secondary | ICD-10-CM | POA: Diagnosis not present

## 2020-10-22 DIAGNOSIS — M25562 Pain in left knee: Secondary | ICD-10-CM

## 2020-10-22 NOTE — Progress Notes (Signed)
The patient is a very pleasant 87 year old female that we saw about 6 weeks ago and place a steroid injection in her left knee.  She has been going to physical therapy to work on strengthening both her lower extremities.  She does wish to have her therapy extended for several more weeks which I agree with because they can also work on her posture which can help her knees.  Her daughter is with her today.  I did go over her x-rays showing both knees have significant arthritis but the left knee has been the one more painful to her.  She said the steroid injection the left knee was very helpful.  She does ambit with a cane and still has some discomfort.  We talked about alternating Tylenol arthritis and Aleve and continuing her therapy.  Both knees have significant arthritis with the left knee more of the medial compartment and the right knee more the lateral compartment.  Both knees have bony swelling but no significant effusion with good range of motion.  Is more painful in the left knee than the right.  She was continue conservative treatment for now and I agree with this.  We did talk about knee replacement surgery at some point as terms of recommendation to follow conservative treatments fail.  I did show her and her daughter knee replacement model and explained how the surgery is performed.  All question concerns were answered addressed.  We can see her back in 6 weeks for repeat a steroid injection in the left knee.

## 2020-10-24 ENCOUNTER — Ambulatory Visit: Payer: Medicare Other

## 2020-10-24 ENCOUNTER — Other Ambulatory Visit: Payer: Self-pay

## 2020-10-24 DIAGNOSIS — G8929 Other chronic pain: Secondary | ICD-10-CM | POA: Diagnosis not present

## 2020-10-24 DIAGNOSIS — R2681 Unsteadiness on feet: Secondary | ICD-10-CM | POA: Diagnosis not present

## 2020-10-24 DIAGNOSIS — M6281 Muscle weakness (generalized): Secondary | ICD-10-CM | POA: Diagnosis not present

## 2020-10-24 DIAGNOSIS — M25561 Pain in right knee: Secondary | ICD-10-CM | POA: Diagnosis not present

## 2020-10-24 DIAGNOSIS — M25562 Pain in left knee: Secondary | ICD-10-CM

## 2020-10-24 NOTE — Therapy (Signed)
[place], [name], [address] Phone: [phone]   Fax:  [phone]  Physical Therapy Treatment  Patient Details  Name: Toni Medina MRN: [id number] Date of Birth: May 07, 1931 Referring Provider (PT): Mcarthur Rossetti   Encounter Date: 10/24/2020   PT End of Session - 10/24/20 1104     Visit Number 7    Number of Visits 8    Date for PT Re-Evaluation 11/09/20    Authorization Type MCR and BCBS    Authorization Time Period FOTO v6(captured 10/17/20), v10    Progress Note Due on Visit 10    PT Start Time 1045    PT Stop Time 1130    PT Time Calculation (min) 45 min    Activity Tolerance Patient tolerated treatment well    Behavior During Therapy [facility] for tasks assessed/performed             History reviewed. No pertinent past medical history.  Past Surgical History:  Procedure Laterality Date   BREAST EXCISIONAL BIOPSY Left 2014    There were no vitals filed for this visit.   Subjective Assessment - 10/24/20 1044     Subjective Pt reports feeling okay this morning, stating that she still has good days and bad days and that her Meloxicam helps dull the pain. She reports continuing to perform her HEP daily. The pt reports that the main thing she would like to improve on is standing up straight when walking.    Currently in Pain? Yes    Pain Score 4     Pain Location Knee    Pain Orientation Right;Left    Pain Descriptors / Indicators Discomfort    Pain Type Chronic pain    Pain Onset More than a month ago    Pain Frequency Intermittent                               [facility] Adult PT Treatment/Exercise - 10/24/20 0001       Knee/Hip Exercises: Aerobic   Stationary Bike x5 minutes, level 3 resistance at self-selected pace      Knee/Hip Exercises: Machines for Strengthening   Cybex Knee Extension Eccentric (2 up, 1 down) 2x10 with 5# cable BIL    Cybex Leg  Press Eccentric (2 up, 1 down) 2x8 BIL with 20#    Hip Cybex Abduction and extension with 12.5# 2x10 each BIL                     PT Education - 10/24/20 1113     Education Details Reviewed patient's use of single-point cane, demonstrating how to set the height of her AD    Person(s) Educated Patient    Methods Explanation;Demonstration    Comprehension Verbalized understanding;Returned demonstration              PT Short Term Goals - 10/17/20 1137       PT SHORT TERM GOAL #1   Title Pt will report understanding and adherence to her HEP in order to promote independence in the management of her primary impairments.    Baseline Pt reports min compliance    Time 4    Period Weeks    Status On-going    Target Date 10/12/20               PT Long Term Goals - 10/17/20 1158  PT LONG TERM GOAL #1   Title Pt will achieve FOTO score of 60% in order to demonstrate improved functional ability as it relates to her knee pain.    Baseline 43%; 10/17/20: 49%    Time 8    Period Weeks    Status On-going      PT LONG TERM GOAL #2   Title Pt will achieve WNL BIL knee flexion and extension AROM with 0-1/10 pain in order to promote normal gait pattern.    Baseline Eval: pt limited in R knee extension and L knee flexion, pain with each; 10/17/20: no pain with bilat knee flexion but pain with full extension    Time 8    Period Weeks    Status On-going      PT LONG TERM GOAL #3   Title Pt will report ability to walk for >15 minutes with 0-1/10 pain in order to ambulate while grocery shopping without difficulty.    Baseline Eval: Unable to take a few steps without >5/10 pain 10/17/20: needs cart in store, can cook and do house work as needed with rest breaks and [place] of UE on counter    Time 8    Period Weeks    Status On-going      PT LONG TERM GOAL #4   Title Pt will achieve BIL tandem stance >15sec in order to demonstrate improved functional balance.    Baseline  eval: Unable to perform either side due to LOB    Time 8    Period Weeks    Status Unable to assess                   Plan - 10/24/20 1106     Clinical Impression Statement Pt responded well to all interventions today, demonstrating proper form and no increase in pain with selected exercises. She demonstrates improved functional LE strength as she was able to perform increased loading through the hips/ knees in today's session. She will continue to benefit from skilled PT to address her primary impairments and return to her prior level of function with less limitation.    Personal Factors and Comorbidities Age    Examination-Activity Limitations Locomotion Level;Transfers;Bathing;Bend;Squat;Stairs;Stand    Examination-Participation Restrictions Cleaning;Laundry;Shop;Other    Stability/Clinical Decision Making Stable/Uncomplicated    Clinical Decision Making Low    Rehab Potential Fair    PT Frequency 1x / week    PT Duration 8 weeks    PT Treatment/Interventions ADLs/Self Care Home Management;Aquatic Therapy;Cryotherapy;Moist Heat;DME Instruction;Manual techniques;Balance training;Therapeutic exercise;Therapeutic activities;Functional mobility training;Stair training;Gait training;Patient/family education;Passive range of motion    PT Next Visit Plan Progress LE strengthening, assess response to walking program, add tandem balance. assess response to  hip flexo /quad flexibility exs added, posterior chain strength    PT Home Exercise Plan [id number]    Consulted and Agree with Plan of Care Patient             Patient will benefit from skilled therapeutic intervention in order to improve the following deficits and impairments:  Abnormal gait, Difficulty walking, Decreased activity tolerance, Pain, Improper body mechanics, Decreased balance, Decreased mobility, Decreased range of motion, Decreased strength  Visit Diagnosis: Chronic pain of left knee  Chronic pain of right  knee  Muscle weakness (generalized)  Unsteadiness on feet     Problem List Patient Active Problem List   Diagnosis Date Noted   Unilateral primary osteoarthritis, left knee 09/10/2020    Vanessa Woodway, PT, DPT 10/24/20 11:30 AM  [place], [name], [address] Phone: [phone]   Fax:  [phone]  Name: Toni Medina MRN: [id number] Date of Birth: 01-Mar-1931

## 2020-10-31 ENCOUNTER — Ambulatory Visit: Payer: Medicare Other

## 2020-10-31 ENCOUNTER — Other Ambulatory Visit: Payer: Self-pay

## 2020-10-31 DIAGNOSIS — M25561 Pain in right knee: Secondary | ICD-10-CM | POA: Diagnosis not present

## 2020-10-31 DIAGNOSIS — M6281 Muscle weakness (generalized): Secondary | ICD-10-CM

## 2020-10-31 DIAGNOSIS — R2681 Unsteadiness on feet: Secondary | ICD-10-CM | POA: Diagnosis not present

## 2020-10-31 DIAGNOSIS — M25562 Pain in left knee: Secondary | ICD-10-CM | POA: Diagnosis not present

## 2020-10-31 DIAGNOSIS — G8929 Other chronic pain: Secondary | ICD-10-CM | POA: Diagnosis not present

## 2020-10-31 NOTE — Therapy (Signed)
[place], [name], [address] Phone: [phone]   Fax:  [phone]  Physical Therapy Treatment/ Re-evaluation  Progress Note Reporting Period 09/14/2020 to 10/31/2020   See note below for Objective Data and Assessment of Progress/Goals.      Patient Details  Name: Toni Medina MRN: [id number] Date of Birth: [birth year] Referring Provider (PT): Mcarthur Rossetti   Encounter Date: 10/31/2020   PT End of Session - 10/31/20 1122     Visit Number 8    Number of Visits 14    Date for PT Re-Evaluation 12/12/20    Authorization Type MCR and BCBS    Authorization Time Period FOTO v6(captured 10/17/20), v10    Progress Note Due on Visit 10    PT Start Time 1045    PT Stop Time 1130    PT Time Calculation (min) 45 min    Equipment Utilized During Treatment Gait belt    Activity Tolerance Patient tolerated treatment well    Behavior During Therapy [facility] for tasks assessed/performed             History reviewed. No pertinent past medical history.  Past Surgical History:  Procedure Laterality Date   BREAST EXCISIONAL BIOPSY Left 2014    There were no vitals filed for this visit.   Subjective Assessment - 10/31/20 1042     Subjective Pt reports feeling "fairly well" today. She states she has been doing her HEP every day, but asks if her hip flexor stretch should also be stretching her adductors as this is where she feels the biggest stretch.    How long can you sit comfortably? Unlimited    How long can you stand comfortably? 5 minutes    How long can you walk comfortably? 5 minutes    Diagnostic tests X-ray knee 09/10/2020: 2 views of the left knee standing also shows the right knee on the AP view.  Both knees are windswept with significant loss of the medial joint space on the left knee and significant lateral joint space loss on the right knee.  There is significant patellofemoral narrowing  on the left knee.  This is end-stage tricompartmental arthritis.    Patient Stated Goals Shopping, cooking, baking    Currently in Pain? Yes    Pain Score 3     Pain Location Knee    Pain Orientation Left;Right    Pain Descriptors / Indicators Discomfort    Pain Type Chronic pain    Pain Onset More than a month ago    Pain Frequency Intermittent                [facility] PT Assessment - 10/31/20 0001       Observation/Other Assessments   Focus on Therapeutic Outcomes (FOTO)  40%      AROM   Right Knee Extension -8    Right Knee Flexion 130    Left Knee Extension -5    Left Knee Flexion 126      Strength   Right Hip Flexion 5/5    Right Hip Extension 4/5    Right Hip ABduction 4-/5    Left Hip Flexion 5/5    Left Hip Extension 4/5    Left Hip ABduction 4+/5    Right Knee Flexion 5/5    Right Knee Extension 5/5    Left Knee Flexion 5/5    Left Knee Extension 5/5      Berg Balance Test  Sit to Stand Able to stand without using hands and stabilize independently    Standing Unsupported Able to stand safely 2 minutes    Sitting with Back Unsupported but Feet Supported on Floor or Stool Able to sit safely and securely 2 minutes    Stand to Sit Sits safely with minimal use of hands    Transfers Able to transfer safely, minor use of hands    Standing Unsupported with Eyes Closed Able to stand 10 seconds safely    Standing Unsupported with Feet Together Able to place feet together independently and stand 1 minute safely    From Standing, Reach Forward with Outstretched Arm Can reach confidently >25 cm (10")    From Standing Position, Pick up Object from Floor Able to pick up shoe safely and easily    From Standing Position, Turn to Look Behind Over each Shoulder Looks behind from both sides and weight shifts well    Turn 360 Degrees Able to turn 360 degrees safely in 4 seconds or less    Standing Unsupported, Alternately Place Feet on Step/Stool Able to stand independently and  safely and complete 8 steps in 20 seconds    Standing Unsupported, One Foot in Front Able to place foot tandem independently and hold 30 seconds    Standing on One Leg Able to lift leg independently and hold > 10 seconds    Total Score 56                           [facility] Adult PT Treatment/Exercise - 10/31/20 0001       Knee/Hip Exercises: Machines for Strengthening   Cybex Knee Extension Eccentric (2 up, 1 down) 2x10 with 10# cable BIL    Hip Cybex Abduction and extension with 25# 2x10 each BIL                     PT Education - 10/31/20 1134     Education Details Pt educated on objective improvements made in PT thus far, as well as POC moving forward.    Person(s) Educated Patient    Methods Explanation    Comprehension Verbalized understanding              PT Short Term Goals - 10/31/20 1053       PT SHORT TERM GOAL #1   Title Pt will report understanding and adherence to her HEP in order to promote independence in the management of her primary impairments.    Baseline Pt reports daily adherence to her HEP (10/31/2020)    Time 4    Period Weeks    Status Achieved    Target Date 10/12/20               PT Long Term Goals - 10/31/20 1128       PT LONG TERM GOAL #1   Title Pt will achieve FOTO score of 60% in order to demonstrate improved functional ability as it relates to her knee pain.    Baseline 43%; 10/17/20: 49%; 10/31/2020: 40%    Time 8    Period Weeks    Status On-going      PT LONG TERM GOAL #2   Title Pt will achieve WNL BIL knee flexion and extension AROM with 0-1/10 pain in order to promote normal gait pattern.    Baseline Eval: pt limited in R knee extension and L knee flexion, pain with each; 10/17/20: no  pain with bilat knee flexion but pain with full extension; no pain with AROM testing, but limited in BIL knee extension ROM    Time 8    Period Weeks    Status On-going      PT LONG TERM GOAL #3   Title Pt will  report ability to walk for >15 minutes with 0-1/10 pain in order to ambulate while grocery shopping without difficulty.    Baseline Eval: Unable to take a few steps without >5/10 pain 10/17/20: needs cart in store, can cook and do house work as needed with rest breaks and [place] of UE on counter; 10/31/2020: able to walk about 5 minutes without pain    Time 8    Period Weeks    Status On-going      PT LONG TERM GOAL #4   Title Pt will achieve BIL tandem stance >15sec in order to demonstrate improved functional balance.    Baseline eval: Unable to perform either side due to LOB; 10/31/2020: Achieved BIL tandem stance x 30sec    Time 8    Period Weeks    Status Achieved      PT LONG TERM GOAL #5   Title New (10/31/2020) Pt will complete a 6MWT independently at self-selected pace without a seated rest in order to be a functional community ambulator.    Baseline Unable to walk more than a few steps without her cane    Time 6    Period Weeks    Status New    Target Date 12/12/20      Additional Long Term Goals   Additional Long Term Goals Yes      PT LONG TERM GOAL #6   Title Pt will achieve global BIL hip strength of 5/5 in order to progress LE strengthening program.    Baseline See flowsheet    Time 6    Period Weeks    Status New    Target Date 12/12/20                   Plan - 10/31/20 1134     Clinical Impression Statement Pt responded well to all interventions today, demonstrating proper form and no increase in pain with selected exercises. Upon re-assessment of objective measures, the pt has made good progress in the areas of global BIL hip strength, tandem stance balance, single leg balance, Berg balance score, and functional ability to walk and stand for longer periods of time. She is still limited in knee AROM, hip strength, and functional mobility. She will continue to benefit from skilled PT to address her primary impairments and return to her prior level of function with  less limitation.    Personal Factors and Comorbidities Age    Examination-Activity Limitations Locomotion Level;Transfers;Bathing;Bend;Squat;Stairs;Stand    Examination-Participation Restrictions Cleaning;Laundry;Shop;Other    Stability/Clinical Decision Making Stable/Uncomplicated    Clinical Decision Making Low    Rehab Potential Fair    PT Frequency 1x / week    PT Duration 8 weeks    PT Treatment/Interventions ADLs/Self Care Home Management;Aquatic Therapy;Cryotherapy;Moist Heat;DME Instruction;Manual techniques;Balance training;Therapeutic exercise;Therapeutic activities;Functional mobility training;Stair training;Gait training;Patient/family education;Passive range of motion    PT Next Visit Plan Progress LE strengthening, assess response to walking program, posterior chain strength, core strengthening    PT Home Exercise Plan [id number]    Consulted and Agree with Plan of Care Patient             Patient will benefit from skilled therapeutic intervention  in order to improve the following deficits and impairments:  Abnormal gait, Difficulty walking, Decreased activity tolerance, Pain, Improper body mechanics, Decreased balance, Decreased mobility, Decreased range of motion, Decreased strength  Visit Diagnosis: Chronic pain of left knee  Chronic pain of right knee  Muscle weakness (generalized)  Unsteadiness on feet     Problem List Patient Active Problem List   Diagnosis Date Noted   Unilateral primary osteoarthritis, left knee 09/10/2020    Vanessa [place], PT, DPT 10/31/20 11:45 AM   [facility] [address] [place], [name], [address] Phone: [phone]   Fax:  [phone]  Name: Toni Medina MRN: [id number] Date of Birth: 1932/01/20

## 2020-11-07 ENCOUNTER — Ambulatory Visit: Payer: Medicare Other | Attending: Internal Medicine

## 2020-11-07 ENCOUNTER — Other Ambulatory Visit (HOSPITAL_BASED_OUTPATIENT_CLINIC_OR_DEPARTMENT_OTHER): Payer: Self-pay

## 2020-11-07 DIAGNOSIS — Z23 Encounter for immunization: Secondary | ICD-10-CM

## 2020-11-07 MED ORDER — PFIZER COVID-19 VAC BIVALENT 30 MCG/0.3ML IM SUSP
INTRAMUSCULAR | 0 refills | Status: DC
  Filled 2020-11-07: qty 0.3, 1d supply, fill #0

## 2020-11-07 NOTE — Progress Notes (Signed)
   Covid-19 Vaccination Clinic  Name:  Mandee Pluta    MRN: [id number] DOB: January 05, 1932  11/07/2020  Ms. Vanzile was observed post Covid-19 immunization for 15 minutes without incident. She was provided with Vaccine Information Sheet and instruction to access the V-Safe system.   Ms. Dinkel was instructed to call 911 with any severe reactions post vaccine: Difficulty breathing  Swelling of face and throat  A fast heartbeat  A bad rash all over body  Dizziness and weakness

## 2020-11-14 ENCOUNTER — Encounter: Payer: Self-pay | Admitting: Physical Therapy

## 2020-11-14 ENCOUNTER — Ambulatory Visit: Payer: Medicare Other | Attending: Orthopaedic Surgery | Admitting: Physical Therapy

## 2020-11-14 ENCOUNTER — Other Ambulatory Visit: Payer: Self-pay

## 2020-11-14 DIAGNOSIS — R2681 Unsteadiness on feet: Secondary | ICD-10-CM | POA: Insufficient documentation

## 2020-11-14 DIAGNOSIS — M6281 Muscle weakness (generalized): Secondary | ICD-10-CM | POA: Diagnosis not present

## 2020-11-14 DIAGNOSIS — M25561 Pain in right knee: Secondary | ICD-10-CM | POA: Insufficient documentation

## 2020-11-14 DIAGNOSIS — M25562 Pain in left knee: Secondary | ICD-10-CM | POA: Insufficient documentation

## 2020-11-14 DIAGNOSIS — G8929 Other chronic pain: Secondary | ICD-10-CM | POA: Diagnosis not present

## 2020-11-14 NOTE — Therapy (Signed)
[place], [name], [address] Phone: [phone]   Fax:  [phone]  Physical Therapy Treatment  Patient Details  Name: Toni Medina MRN: [id number] Date of Birth: 1931/09/12 Referring Provider (PT): Mcarthur Rossetti   Encounter Date: 11/14/2020   PT End of Session - 11/14/20 1032     Visit Number 9    Number of Visits 14    Date for PT Re-Evaluation 12/12/20    Authorization Type MCR and BCBS    Authorization Time Period FOTO v6(captured 10/17/20), v10    Progress Note Due on Visit 10    PT Start Time 1016    PT Stop Time 1058    PT Time Calculation (min) 42 min             History reviewed. No pertinent past medical history.  Past Surgical History:  Procedure Laterality Date   BREAST EXCISIONAL BIOPSY Left 2014    There were no vitals filed for this visit.   Subjective Assessment - 11/14/20 1031     Subjective Low back is hurting 5/10, no knee pain yet. I have a referral from Dr Ninfa Linden that asks to work on posture and I think that is important.    Diagnostic tests X-ray knee 09/10/2020: 2 views of the left knee standing also shows the right knee on the AP view.  Both knees are windswept with significant loss of the medial joint space on the left knee and significant lateral joint space loss on the right knee.  There is significant patellofemoral narrowing on the left knee.  This is end-stage tricompartmental arthritis.    Currently in Pain? Yes    Pain Score 5     Pain Location Back    Pain Orientation Lower    Aggravating Factors  standing, walking    Pain Relieving Factors pain meds, warm compress              [facility] Adult PT Treatment/Exercise - 11/14/20 0001       Knee/Hip Exercises: Aerobic   Other Aerobic UBE x 5 minutes in reverse      Knee/Hip Exercises: Supine   Bridges Both;10 reps    Bridges Limitations with yellow horizontal abduction      Knee/Hip  Exercises: Prone   Other Prone Exercises qped hip extension x 10 each    Other Prone Exercises Prone lumbar extension with UE assist 10 sec x 10 - unable to perform without UE      Shoulder Exercises: Supine   Horizontal ABduction 20 reps    Theraband Level (Shoulder Horizontal ABduction) Level 1 (Yellow)    Diagonals 15 reps    Theraband Level (Shoulder Diagonals) Level 1 (Yellow)      Shoulder Exercises: Standing   Extension 20 reps    Theraband Level (Shoulder Extension) Level 3 (Green)    Row 20 reps    Theraband Level (Shoulder Row) Level 3 (Green)                PT Short Term Goals - 10/31/20 1053       PT SHORT TERM GOAL #1   Title Pt will report understanding and adherence to her HEP in order to promote independence in the management of her primary impairments.    Baseline Pt reports daily adherence to her HEP (10/31/2020)    Time 4    Period Weeks    Status Achieved    Target Date 10/12/20  PT Long Term Goals - 10/31/20 1128       PT LONG TERM GOAL #1   Title Pt will achieve FOTO score of 60% in order to demonstrate improved functional ability as it relates to her knee pain.    Baseline 43%; 10/17/20: 49%; 10/31/2020: 40%    Time 8    Period Weeks    Status On-going      PT LONG TERM GOAL #2   Title Pt will achieve WNL BIL knee flexion and extension AROM with 0-1/10 pain in order to promote normal gait pattern.    Baseline Eval: pt limited in R knee extension and L knee flexion, pain with each; 10/17/20: no pain with bilat knee flexion but pain with full extension; no pain with AROM testing, but limited in BIL knee extension ROM    Time 8    Period Weeks    Status On-going      PT LONG TERM GOAL #3   Title Pt will report ability to walk for >15 minutes with 0-1/10 pain in order to ambulate while grocery shopping without difficulty.    Baseline Eval: Unable to take a few steps without >5/10 pain 10/17/20: needs cart in store, can cook and  do house work as needed with rest breaks and [place] of UE on counter; 10/31/2020: able to walk about 5 minutes without pain    Time 8    Period Weeks    Status On-going      PT LONG TERM GOAL #4   Title Pt will achieve BIL tandem stance >15sec in order to demonstrate improved functional balance.    Baseline eval: Unable to perform either side due to LOB; 10/31/2020: Achieved BIL tandem stance x 30sec    Time 8    Period Weeks    Status Achieved      PT LONG TERM GOAL #5   Title New (10/31/2020) Pt will complete a 6MWT independently at self-selected pace without a seated rest in order to be a functional community ambulator.    Baseline Unable to walk more than a few steps without her cane    Time 6    Period Weeks    Status New    Target Date 12/12/20      Additional Long Term Goals   Additional Long Term Goals Yes      PT LONG TERM GOAL #6   Title Pt will achieve global BIL hip strength of 5/5 in order to progress LE strengthening program.    Baseline See flowsheet    Time 6    Period Weeks    Status New    Target Date 12/12/20                   Plan - 11/14/20 1045     Clinical Impression Statement Pt arrives with referral from Dr Ninfa Linden date 10/22/20 to extend PT to work on Posture. Began UBE working on reverse and upright posture. Continued with standing, seated and supine scapular bands. Worked on quadruped and prone posterior chain strength. Pt requested additional HEP pictures.    PT Treatment/Interventions ADLs/Self Care Home Management;Aquatic Therapy;Cryotherapy;Moist Heat;DME Instruction;Manual techniques;Balance training;Therapeutic exercise;Therapeutic activities;Functional mobility training;Stair training;Gait training;Patient/family education;Passive range of motion    PT Next Visit Plan Progress LE strengthening, assess response to walking program, posterior chain strength, core strengthening    PT Home Exercise Plan [id number]    Consulted and Agree with Plan  of Care Patient  Patient will benefit from skilled therapeutic intervention in order to improve the following deficits and impairments:  Abnormal gait, Difficulty walking, Decreased activity tolerance, Pain, Improper body mechanics, Decreased balance, Decreased mobility, Decreased range of motion, Decreased strength  Visit Diagnosis: Chronic pain of right knee  Chronic pain of left knee  Muscle weakness (generalized)  Unsteadiness on feet     Problem List Patient Active Problem List   Diagnosis Date Noted   Unilateral primary osteoarthritis, left knee 09/10/2020    Dorene Ar, PTA 11/14/2020, 12:12 PM  [facility] [address] [place], [name], [address] Phone: [phone]   Fax:  [phone]  Name: Toni Medina MRN: [id number] Date of Birth: February 15, 1931

## 2020-11-21 ENCOUNTER — Ambulatory Visit: Payer: Medicare Other

## 2020-11-21 ENCOUNTER — Other Ambulatory Visit: Payer: Self-pay

## 2020-11-21 DIAGNOSIS — Z23 Encounter for immunization: Secondary | ICD-10-CM | POA: Diagnosis not present

## 2020-11-21 DIAGNOSIS — R2681 Unsteadiness on feet: Secondary | ICD-10-CM | POA: Diagnosis not present

## 2020-11-21 DIAGNOSIS — G8929 Other chronic pain: Secondary | ICD-10-CM

## 2020-11-21 DIAGNOSIS — M25562 Pain in left knee: Secondary | ICD-10-CM | POA: Diagnosis not present

## 2020-11-21 DIAGNOSIS — M6281 Muscle weakness (generalized): Secondary | ICD-10-CM | POA: Diagnosis not present

## 2020-11-21 DIAGNOSIS — M25561 Pain in right knee: Secondary | ICD-10-CM | POA: Diagnosis not present

## 2020-11-21 NOTE — Therapy (Signed)
[place], [name], [address] Phone: [phone]   Fax:  [phone]  Physical Therapy Treatment  Progress Note Reporting Period 09/14/2020 to 11/21/2020  See note below for Objective Data and Assessment of Progress/Goals.      Patient Details  Name: Toni Medina MRN: [id number] Date of Birth: 05-11-1931 Referring Provider (PT): Mcarthur Rossetti   Encounter Date: 11/21/2020   PT End of Session - 11/21/20 1113     Visit Number 10    Number of Visits 14    Date for PT Re-Evaluation 12/12/20    Authorization Type MCR and BCBS    Progress Note Due on Visit 10    PT Start Time 1050    PT Stop Time 1130    PT Time Calculation (min) 40 min    Activity Tolerance Patient tolerated treatment well    Behavior During Therapy [facility] for tasks assessed/performed             History reviewed. No pertinent past medical history.  Past Surgical History:  Procedure Laterality Date   BREAST EXCISIONAL BIOPSY Left 2014    There were no vitals filed for this visit.   Subjective Assessment - 11/21/20 1047     Subjective Pt reports moderate BIL knee pain L>R today, adding that her pain medication usually wears off early afternoon. She reports she has been more aware of her posture recently. She adds that her home exercises have been going well and that she does not have pain with her exercises.    How long can you sit comfortably? Unlimited    How long can you stand comfortably? 5 minutes    How long can you walk comfortably? 5 minutes    Diagnostic tests X-ray knee 09/10/2020: 2 views of the left knee standing also shows the right knee on the AP view.  Both knees are windswept with significant loss of the medial joint space on the left knee and significant lateral joint space loss on the right knee.  There is significant patellofemoral narrowing on the left knee.  This is end-stage tricompartmental  arthritis.    Patient Stated Goals Shopping, cooking, baking    Currently in Pain? Yes    Pain Score 4     Pain Location Knee    Pain Orientation Right;Left    Pain Descriptors / Indicators Aching    Pain Type Chronic pain    Pain Onset More than a month ago    Pain Frequency Intermittent                [facility] PT Assessment - 11/21/20 0001       Observation/Other Assessments   Focus on Therapeutic Outcomes (FOTO)  43%      AROM   Right Knee Extension -5    Right Knee Flexion 132    Left Knee Extension -4    Left Knee Flexion 130      Strength   Right Hip Flexion 5/5    Right Hip Extension 4/5    Right Hip ABduction 4+/5    Left Hip Flexion 5/5    Left Hip Extension 4/5    Left Hip ABduction 4+/5                           [facility] Adult PT Treatment/Exercise - 11/21/20 0001       Knee/Hip Exercises: Stretches   Hip Flexor Stretch Limitations  mod thomas   x1 minute BIL     Knee/Hip Exercises: Machines for Strengthening   Hip Cybex Abduction and extension with 30# 2x10 each BIL      Knee/Hip Exercises: Standing   Other Standing Knee Exercises Standing Pallof press with 3# cable 2x6 BIL    Other Standing Knee Exercises Sit-to-stand with slow sit 2x10                     PT Education - 11/21/20 1130     Education Details Educated on objective measures, reviewed HEP    Person(s) Educated Patient    Methods Explanation;Handout    Comprehension Verbalized understanding              PT Short Term Goals - 10/31/20 1053       PT SHORT TERM GOAL #1   Title Pt will report understanding and adherence to her HEP in order to promote independence in the management of her primary impairments.    Baseline Pt reports daily adherence to her HEP (10/31/2020)    Time 4    Period Weeks    Status Achieved    Target Date 10/12/20               PT Long Term Goals - 11/21/20 1125       PT LONG TERM GOAL #1   Title Pt will achieve  FOTO score of 60% in order to demonstrate improved functional ability as it relates to her knee pain.    Baseline 43%; 10/17/20: 49%; 10/31/2020: 40%; 11/21/2020: 43%    Time 8    Period Weeks    Status On-going      PT LONG TERM GOAL #2   Title Pt will achieve WNL BIL knee flexion and extension AROM with 0-1/10 pain in order to promote normal gait pattern.    Baseline Eval: pt limited in R knee extension and L knee flexion, pain with each; 10/17/20: no pain with bilat knee flexion but pain with full extension; no pain with AROM testing, but limited in BIL knee extension ROM; Achieved (11/21/2020)    Time 8    Period Weeks    Status Achieved      PT LONG TERM GOAL #3   Title Pt will report ability to walk for >15 minutes with 0-1/10 pain in order to ambulate while grocery shopping without difficulty.    Baseline Eval: Unable to take a few steps without >5/10 pain 10/17/20: needs cart in store, can cook and do house work as needed with rest breaks and [place] of UE on counter; 10/31/2020: able to walk about 5 minutes without pain; 11/21/2020: 5 minutes    Time 8    Period Weeks    Status On-going      PT LONG TERM GOAL #4   Title Pt will achieve BIL tandem stance >15sec in order to demonstrate improved functional balance.    Baseline eval: Unable to perform either side due to LOB; 10/31/2020: Achieved BIL tandem stance x 30sec    Time 8    Period Weeks    Status Achieved      PT LONG TERM GOAL #5   Title New (10/31/2020) Pt will complete a 6MWT independently at self-selected pace without a seated rest in order to be a functional community ambulator.    Baseline Unable to walk more than a few steps without her cane    Time 6    Period Weeks  Status On-going      PT LONG TERM GOAL #6   Title Pt will achieve global BIL hip strength of 5/5 in order to progress LE strengthening program.    Baseline See flowsheet    Time 6    Period Weeks    Status On-going                   Plan -  11/21/20 1114     Clinical Impression Statement Upon reassessment, pt has made progress in pain-free BIL knee AROM, as well as her FOTO score. She remains limited in hip strength and functional standing and walking ability. The pt responded well to all interventions today, demonstrating good form and no increase in pain with selected exercises. She will continue to benefit from skilled PT to address her primary impairments and return to her prior level of function with less limitation.    Personal Factors and Comorbidities Age    Examination-Activity Limitations Locomotion Level;Transfers;Bathing;Bend;Squat;Stairs;Stand    Examination-Participation Restrictions Cleaning;Laundry;Shop;Other    Stability/Clinical Decision Making Stable/Uncomplicated    Clinical Decision Making Low    Rehab Potential Fair    PT Frequency 1x / week    PT Duration 8 weeks    PT Treatment/Interventions ADLs/Self Care Home Management;Aquatic Therapy;Cryotherapy;Moist Heat;DME Instruction;Manual techniques;Balance training;Therapeutic exercise;Therapeutic activities;Functional mobility training;Stair training;Gait training;Patient/family education;Passive range of motion    PT Next Visit Plan Progress LE strengthening, assess response to walking program, posterior chain strength, core strengthening    PT Home Exercise Plan [id number]    Consulted and Agree with Plan of Care Patient             Patient will benefit from skilled therapeutic intervention in order to improve the following deficits and impairments:  Abnormal gait, Difficulty walking, Decreased activity tolerance, Pain, Improper body mechanics, Decreased balance, Decreased mobility, Decreased range of motion, Decreased strength  Visit Diagnosis: Chronic pain of right knee  Chronic pain of left knee  Muscle weakness (generalized)  Unsteadiness on feet     Problem List Patient Active Problem List   Diagnosis Date Noted   Unilateral primary  osteoarthritis, left knee 09/10/2020    Vanessa Upham, PT, DPT 11/21/20 11:33 AM   [facility] [address] [place], [name], [address] Phone: [phone]   Fax:  [phone]  Name: Toni Medina MRN: [id number] Date of Birth: 01/17/1932

## 2020-11-28 ENCOUNTER — Other Ambulatory Visit: Payer: Self-pay

## 2020-11-28 ENCOUNTER — Ambulatory Visit: Payer: Medicare Other

## 2020-11-28 DIAGNOSIS — R2681 Unsteadiness on feet: Secondary | ICD-10-CM

## 2020-11-28 DIAGNOSIS — M25561 Pain in right knee: Secondary | ICD-10-CM

## 2020-11-28 DIAGNOSIS — M25562 Pain in left knee: Secondary | ICD-10-CM | POA: Diagnosis not present

## 2020-11-28 DIAGNOSIS — G8929 Other chronic pain: Secondary | ICD-10-CM | POA: Diagnosis not present

## 2020-11-28 DIAGNOSIS — M6281 Muscle weakness (generalized): Secondary | ICD-10-CM

## 2020-11-28 NOTE — Therapy (Signed)
[place], [name], [address] Phone: [phone]   Fax:  [phone]  Physical Therapy Treatment  Patient Details  Name: Somalia Segler MRN: [id number] Date of Birth: 19-Mar-1931 Referring Provider (PT): Mcarthur Rossetti   Encounter Date: 11/28/2020   PT End of Session - 11/28/20 1048     Visit Number 11    Number of Visits 14    Date for PT Re-Evaluation 12/12/20    Authorization Type MCR and BCBS    Authorization Time Period FOTO v6(captured 10/17/20), v10 (captured 11/21/20)    PT Start Time 1045    PT Stop Time 1130    PT Time Calculation (min) 45 min    Equipment Utilized During Treatment Gait belt    Activity Tolerance Patient tolerated treatment well    Behavior During Therapy [facility] for tasks assessed/performed             History reviewed. No pertinent past medical history.  Past Surgical History:  Procedure Laterality Date   BREAST EXCISIONAL BIOPSY Left 2014    There were no vitals filed for this visit.   Subjective Assessment - 11/28/20 1046     Subjective Pt reports that she has been doing well recently, adding that her Meloxicam helps her through the first part of the day. She reports mild-moderate BIL knee pain L>R. She also adds that she has been adherent to her HEP.    Currently in Pain? Yes    Pain Score 3     Pain Location Knee    Pain Orientation Right;Left    Pain Descriptors / Indicators Aching    Pain Type Chronic pain    Pain Onset More than a month ago    Pain Frequency Intermittent                               [facility] Adult PT Treatment/Exercise - 11/28/20 0001       Knee/Hip Exercises: Stretches   Hip Flexor Stretch Limitations mod thomas   x1 minute BIL   Other Knee/Hip Stretches Supine lower trunk rotation x10 BIL with 5-sec hold      Knee/Hip Exercises: Standing   Forward Step Up Limitations 2x10 BIL with 3# ankle weights on  6-inch step    Other Standing Knee Exercises Deadlift with two 10# KB's from 8-inch boxes 3x8    Other Standing Knee Exercises Sit-to-stand with slow sit 2x10                       PT Short Term Goals - 10/31/20 1053       PT SHORT TERM GOAL #1   Title Pt will report understanding and adherence to her HEP in order to promote independence in the management of her primary impairments.    Baseline Pt reports daily adherence to her HEP (10/31/2020)    Time 4    Period Weeks    Status Achieved    Target Date 10/12/20               PT Long Term Goals - 11/21/20 1125       PT LONG TERM GOAL #1   Title Pt will achieve FOTO score of 60% in order to demonstrate improved functional ability as it relates to her knee pain.    Baseline 43%; 10/17/20: 49%; 10/31/2020: 40%; 11/21/2020: 43%    Time 8  Period Weeks    Status On-going      PT LONG TERM GOAL #2   Title Pt will achieve WNL BIL knee flexion and extension AROM with 0-1/10 pain in order to promote normal gait pattern.    Baseline Eval: pt limited in R knee extension and L knee flexion, pain with each; 10/17/20: no pain with bilat knee flexion but pain with full extension; no pain with AROM testing, but limited in BIL knee extension ROM; Achieved (11/21/2020)    Time 8    Period Weeks    Status Achieved      PT LONG TERM GOAL #3   Title Pt will report ability to walk for >15 minutes with 0-1/10 pain in order to ambulate while grocery shopping without difficulty.    Baseline Eval: Unable to take a few steps without >5/10 pain 10/17/20: needs cart in store, can cook and do house work as needed with rest breaks and [place] of UE on counter; 10/31/2020: able to walk about 5 minutes without pain; 11/21/2020: 5 minutes    Time 8    Period Weeks    Status On-going      PT LONG TERM GOAL #4   Title Pt will achieve BIL tandem stance >15sec in order to demonstrate improved functional balance.    Baseline eval: Unable to perform  either side due to LOB; 10/31/2020: Achieved BIL tandem stance x 30sec    Time 8    Period Weeks    Status Achieved      PT LONG TERM GOAL #5   Title New (10/31/2020) Pt will complete a 6MWT independently at self-selected pace without a seated rest in order to be a functional community ambulator.    Baseline Unable to walk more than a few steps without her cane    Time 6    Period Weeks    Status On-going      PT LONG TERM GOAL #6   Title Pt will achieve global BIL hip strength of 5/5 in order to progress LE strengthening program.    Baseline See flowsheet    Time 6    Period Weeks    Status On-going                   Plan - 11/28/20 1121     Clinical Impression Statement Pt responded well to all interventions today, demonstrating improved closed-chain knee and hip strength, as indeicated by improved form and motor control with previously performed exercises, along with ability to perform weighted deadlifts. She will continue to benefit from skilled PT to address her primary impairments and return to her prior level of function with less limitation.    Personal Factors and Comorbidities Age    Examination-Activity Limitations Locomotion Level;Transfers;Bathing;Bend;Squat;Stairs;Stand    Examination-Participation Restrictions Cleaning;Laundry;Shop;Other    Stability/Clinical Decision Making Stable/Uncomplicated    Clinical Decision Making Low    Rehab Potential Fair    PT Frequency 1x / week    PT Duration 8 weeks    PT Treatment/Interventions ADLs/Self Care Home Management;Aquatic Therapy;Cryotherapy;Moist Heat;DME Instruction;Manual techniques;Balance training;Therapeutic exercise;Therapeutic activities;Functional mobility training;Stair training;Gait training;Patient/family education;Passive range of motion    PT Next Visit Plan Progress LE strengthening, assess response to walking program, posterior chain strength, core strengthening    PT Home Exercise Plan [id number]     Consulted and Agree with Plan of Care Patient             Patient will benefit from skilled therapeutic intervention  in order to improve the following deficits and impairments:  Abnormal gait, Difficulty walking, Decreased activity tolerance, Pain, Improper body mechanics, Decreased balance, Decreased mobility, Decreased range of motion, Decreased strength  Visit Diagnosis: Chronic pain of right knee  Chronic pain of left knee  Muscle weakness (generalized)  Unsteadiness on feet     Problem List Patient Active Problem List   Diagnosis Date Noted   Unilateral primary osteoarthritis, left knee 09/10/2020    Vanessa Milliken, PT, DPT 11/28/20 11:32 AM   [facility] [address] [place], [name], [address] Phone: [phone]   Fax:  [phone]  Name: Preslee Regas MRN: [id number] Date of Birth: Jun 07, 1931

## 2020-12-03 ENCOUNTER — Ambulatory Visit (INDEPENDENT_AMBULATORY_CARE_PROVIDER_SITE_OTHER): Payer: Medicare Other | Admitting: Orthopaedic Surgery

## 2020-12-03 ENCOUNTER — Encounter: Payer: Self-pay | Admitting: Orthopaedic Surgery

## 2020-12-03 ENCOUNTER — Other Ambulatory Visit: Payer: Self-pay

## 2020-12-03 DIAGNOSIS — M25562 Pain in left knee: Secondary | ICD-10-CM | POA: Diagnosis not present

## 2020-12-03 DIAGNOSIS — G8929 Other chronic pain: Secondary | ICD-10-CM | POA: Diagnosis not present

## 2020-12-03 DIAGNOSIS — M1712 Unilateral primary osteoarthritis, left knee: Secondary | ICD-10-CM | POA: Diagnosis not present

## 2020-12-03 NOTE — Progress Notes (Signed)
The patient is well-known to me.  She is 87 years old and does have windswept knees.  There is bone-on-bone arthritis with the left knee hurting her much worse in the right knee.  The left knee has complete loss of medial joint space in the right knee has complete loss of lateral joint space.  Her children are with her today and have a lot of appropriate questions as it relates to talking about knee replacement surgery.  She is had no acute change in her medical status and she is not on diabetic medications.  The steroid injections have not lasted as long as she would like.  I went over knee replacement model in detail.  We talked about the interoperative and postoperative course and what to expect with surgery.  We had a thorough discussion of the risk and benefits of the surgery as well.  All questions and concerns were answered and addressed.  We will see her back on November 16 before Thanksgiving so we can place a steroid injection in her left knee then for the Thanksgiving holidays.  She would like to consider knee replacement surgery sometime after the first of the year.  We can discuss that again at her next visit for steroid injection of the left knee on November 16.

## 2020-12-05 ENCOUNTER — Encounter: Payer: Self-pay | Admitting: Physical Therapy

## 2020-12-05 ENCOUNTER — Other Ambulatory Visit: Payer: Self-pay

## 2020-12-05 ENCOUNTER — Ambulatory Visit: Payer: Medicare Other | Attending: Orthopaedic Surgery | Admitting: Physical Therapy

## 2020-12-05 DIAGNOSIS — M25561 Pain in right knee: Secondary | ICD-10-CM | POA: Diagnosis not present

## 2020-12-05 DIAGNOSIS — M6281 Muscle weakness (generalized): Secondary | ICD-10-CM | POA: Insufficient documentation

## 2020-12-05 DIAGNOSIS — R2681 Unsteadiness on feet: Secondary | ICD-10-CM | POA: Insufficient documentation

## 2020-12-05 DIAGNOSIS — M25562 Pain in left knee: Secondary | ICD-10-CM | POA: Insufficient documentation

## 2020-12-05 DIAGNOSIS — G8929 Other chronic pain: Secondary | ICD-10-CM | POA: Insufficient documentation

## 2020-12-05 NOTE — Therapy (Signed)
[place], [name], [address] Phone: [phone]   Fax:  [phone]  Physical Therapy Treatment / ERO  Patient Details  Name: Toni Medina MRN: [id number] Date of Birth: 12/13/1931 Referring Provider (PT): Mcarthur Rossetti, MD   Encounter Date: 12/05/2020   PT End of Session - 12/05/20 1347     Visit Number 12    Number of Visits 20    Date for PT Re-Evaluation 01/30/21    Authorization Type MCR A&B    Progress Note Due on Visit 20    PT Start Time 1045    PT Stop Time 1130    PT Time Calculation (min) 45 min    Activity Tolerance Patient tolerated treatment well    Behavior During Therapy [facility] for tasks assessed/performed             History reviewed. No pertinent past medical history.  Past Surgical History:  Procedure Laterality Date   BREAST EXCISIONAL BIOPSY Left 2014    There were no vitals filed for this visit.   Subjective Assessment - 12/05/20 1047     Subjective Patient reports she requires medication in order to be comfortable, still has pain with walking and difficulty maintaining upright posture with extended periods of standing or walking. She requires the use of a [facility] for all mobility.    Limitations Walking;Standing;House hold activities    Patient Stated Goals Shopping, cooking, baking    Currently in Pain? Yes    Pain Score 3     Pain Location Knee    Pain Orientation Right;Left    Pain Descriptors / Indicators Aching;Discomfort    Pain Type Chronic pain    Pain Onset More than a month ago    Pain Frequency Intermittent    Aggravating Factors  Standing, walking    Pain Relieving Factors Medication                [facility] PT Assessment - 12/05/20 0001       Assessment   Medical Diagnosis Chronic pain of bilateral knees, Posture    Referring Provider (PT) Mcarthur Rossetti, MD      Precautions   Precautions None      Restrictions    Weight Bearing Restrictions No      Balance Screen   Has the patient fallen in the past 6 months No      Prior Function   Level of Independence Independent      Cognition   Overall Cognitive Status Within Functional Limits for tasks assessed      Observation/Other Assessments   Focus on Therapeutic Outcomes (FOTO)  41% functional status      Posture/Postural Control   Posture Comments Patient demonstrates rounded posture, forward trunk lean, flexed posturing      Strength   Right Hip ABduction 4-/5    Left Hip ABduction 4-/5      Ambulation/Gait   Ambulation/Gait Yes    Ambulation/Gait Assistance 6: Modified independent (Device/Increase time)    Assistive device Straight cane      6 minute walk test results    Aerobic Endurance Distance Walked 870    Endurance additional comments patient used SPC with no rest breaks                   [facility] Adult PT Treatment/Exercise:  Therapeutic Exercise: Sit<>stand 2 x 10 - wihout UE support Row with green 2 x 15 Standing hip extension  at counter 2 x 10 Bridge 2 x 10 with 5 sec hold SLR 2 x 15 Supine horizontal abduction with green 2 x 10 Modified thomas stretch PROM 3 x 30 sec Sidelying hip abduction 2 x 10  Manual Therapy: N/A  Neuromuscular re-ed: N/A  Therapeutic Activity: N/A  Modalities: N/A  Self Care: See patient education                  PT Education - 12/05/20 1224     Education Details POC update, Exam findings and progress toward goals, HEP update    Person(s) Educated Patient    Methods Explanation;Demonstration;Verbal cues;Handout    Comprehension Verbalized understanding;Returned demonstration;Verbal cues required;Need further instruction              PT Short Term Goals - 10/31/20 1053       PT SHORT TERM GOAL #1   Title Pt will report understanding and adherence to her HEP in order to promote independence in the management of her primary impairments.    Baseline  Pt reports daily adherence to her HEP (10/31/2020)    Time 4    Period Weeks    Status Achieved    Target Date 10/12/20               PT Long Term Goals - 12/05/20 1051       PT LONG TERM GOAL #1   Title Pt will achieve FOTO score of 60% in order to demonstrate improved functional ability as it relates to her knee pain.    Baseline 41% functional status    Time 8    Period Weeks    Status On-going    Target Date 01/30/21      PT LONG TERM GOAL #2   Title Pt will achieve WNL BIL knee flexion and extension AROM with 0-1/10 pain in order to promote normal gait pattern.    Baseline Achieved (11/21/2020)    Time 8    Period Weeks    Status Achieved      PT LONG TERM GOAL #3   Title Pt will report ability to walk for >15 minutes with 0-1/10 pain in order to ambulate while grocery shopping without difficulty.    Baseline Patient reports 3/10 pain at rest, able to walk 6 minutes before rest break    Time 8    Period Weeks    Status On-going    Target Date 01/30/21      PT LONG TERM GOAL #4   Title Pt will achieve BIL tandem stance >15sec in order to demonstrate improved functional balance.    Baseline 10/31/2020: Achieved BIL tandem stance x 30sec    Time 8    Period Weeks    Status Achieved      PT LONG TERM GOAL #5   Title Pt will walk >/= 1200 ft with 6MWT using LRAD to improve community access    Baseline 870    Time 8    Period Weeks    Status Revised    Target Date 01/30/21      Additional Long Term Goals   Additional Long Term Goals Yes      PT LONG TERM GOAL #6   Title Pt will achieve global BIL hip strength of 5/5 in order to progress LE strengthening program.    Baseline continues to demostrate gross hip weakness    Time 8    Period Weeks    Status On-going  Target Date 01/30/21      PT LONG TERM GOAL #7   Title Patient will perfor 5xSTS in </= 15 seconds to indicate improve LE strength reduce any fall risk    Baseline 22 seconds    Time 8     Period Weeks    Status New    Target Date 01/30/21                   Plan - 12/05/20 1405     Clinical Impression Statement Patient tolerated therapy well with no adverse effects. She is making gradual progress toward her goals, but continues to demonstrate difficulty and pain with walking and maintaining an upright posture. She was able to complete a 6MWT this visit but exhibits significant limitation compared to age matched norms, and her 5xSTS test this visit demonstrates gross LE weakness and increase in her fall risk. Patient's HEP was updated this visit to continue progress strength, posture, and walking ability. She would benefit from continued skilled PT to progress her mobility and strength in order to improve her walking ability and maximize functional level.    PT Frequency 1x / week    PT Duration 8 weeks    PT Treatment/Interventions ADLs/Self Care Home Management;Aquatic Therapy;Cryotherapy;Moist Heat;DME Instruction;Manual techniques;Balance training;Therapeutic exercise;Therapeutic activities;Functional mobility training;Stair training;Gait training;Patient/family education;Passive range of motion    PT Next Visit Plan Review HEP and progress PRN, progress LE and postural strengthening, hip flexor/quad stretching    PT Home Exercise Plan [email or web address]    Consulted and Agree with Plan of Care Patient             Patient will benefit from skilled therapeutic intervention in order to improve the following deficits and impairments:  Abnormal gait, Difficulty walking, Decreased activity tolerance, Pain, Improper body mechanics, Decreased balance, Decreased mobility, Decreased range of motion, Decreased strength  Visit Diagnosis: Chronic pain of right knee  Chronic pain of left knee  Muscle weakness (generalized)  Unsteadiness on feet     Problem List Patient Active Problem List   Diagnosis Date Noted   Unilateral primary osteoarthritis, left knee 09/10/2020     Hilda Blades, PT, DPT, LAT, ATC 12/05/20  2:23 PM Phone: [phone] Fax: [facility] [address] [place], [name], [address] Phone: [phone]   Fax:  [phone]  Name: Kenyona Rena MRN: [id number] Date of Birth: 09-Jan-1932

## 2020-12-05 NOTE — Patient Instructions (Signed)
Access Code: [email or web address] URL: [email or web address] Date: 12/05/2020 Prepared by: Hilda Blades  Exercises Modified Marcello Moores Stretch - 2 x daily - 7 x weekly - 3 sets - 3 reps - 30-60 hold Supine Shoulder Horizontal Abduction with Resistance - 1 x daily - 7 x weekly - 2 sets - 15-20 reps Bridge - 1 x daily - 7 x weekly - 2 sets - 10 reps - 5 hold Active Straight Leg Raise with Quad Set - 1 x daily - 7 x weekly - 2 sets - 15-20 reps Sidelying Hip Abduction - 1 x daily - 7 x weekly - 2 sets - 10 reps Sit to Stand with Arms Crossed - 1 x daily - 7 x weekly - 2 sets - 10 reps Standing Row with Anchored Resistance - 1 x daily - 7 x weekly - 2 sets - 15 reps Standing Hip Extension with Counter Support - 1 x daily - 7 x weekly - 2 sets - 10 reps

## 2020-12-11 ENCOUNTER — Ambulatory Visit: Payer: Medicare Other | Admitting: Physical Therapy

## 2020-12-11 ENCOUNTER — Other Ambulatory Visit: Payer: Self-pay

## 2020-12-11 ENCOUNTER — Encounter: Payer: Self-pay | Admitting: Physical Therapy

## 2020-12-11 DIAGNOSIS — M25561 Pain in right knee: Secondary | ICD-10-CM

## 2020-12-11 DIAGNOSIS — G8929 Other chronic pain: Secondary | ICD-10-CM | POA: Diagnosis not present

## 2020-12-11 DIAGNOSIS — M6281 Muscle weakness (generalized): Secondary | ICD-10-CM

## 2020-12-11 DIAGNOSIS — R2681 Unsteadiness on feet: Secondary | ICD-10-CM | POA: Diagnosis not present

## 2020-12-11 DIAGNOSIS — M25562 Pain in left knee: Secondary | ICD-10-CM | POA: Diagnosis not present

## 2020-12-11 NOTE — Therapy (Signed)
[place], [name], [address] Phone: [phone]   Fax:  [phone]  Physical Therapy Treatment  Patient Details  Name: Toni Medina MRN: [id number] Date of Birth: December 23, 1931 Referring Provider (PT): Mcarthur Rossetti, MD   Encounter Date: 12/11/2020   PT End of Session - 12/11/20 1020     Visit Number 13    Number of Visits 20    Date for PT Re-Evaluation 01/30/21    Authorization Type MCR A&B    Authorization Time Period FOTO v6(captured 10/17/20), v10 (captured 11/21/20)    Progress Note Due on Visit 20    PT Start Time 1017             History reviewed. No pertinent past medical history.  Past Surgical History:  Procedure Laterality Date   BREAST EXCISIONAL BIOPSY Left 2014    There were no vitals filed for this visit.   Subjective Assessment - 12/11/20 1423     Subjective Pt reports no change and left knee pain on arrival. She arrives without SPC.    Currently in Pain? Yes    Pain Score 3     Pain Location Knee    Pain Orientation Left    Pain Descriptors / Indicators Aching            [facility] Adult PT Treatment/Exercise:  Therapeutic Exercise: - Nustep L4 UE/LE x 6 minutes  - hip hinge- tap the wall x 10- mod cues for posture  -forearms on counter- hip extension 2 x 5 each - cues for posture - seated on Sit Fit- horizontal abduction yellow , Er x 15 each  Seated with head turns, feet slightly apart  Narrow feet with alternating shoulder flexion -supine hip flexor stretch with strap bilat x 60 sec  -Bridge - feet on airex pad with red band isometric at knees - x 20   -sidelying - hip flexion and extension x 15 each       Manual Therapy: -   Neuromuscular re-ed: -   Therapeutic Activity: -   Self-care/Home Management: -        PT Short Term Goals - 10/31/20 1053       PT SHORT TERM GOAL #1   Title Pt will report understanding and adherence to  her HEP in order to promote independence in the management of her primary impairments.    Baseline Pt reports daily adherence to her HEP (10/31/2020)    Time 4    Period Weeks    Status Achieved    Target Date 10/12/20               PT Long Term Goals - 12/05/20 1051       PT LONG TERM GOAL #1   Title Pt will achieve FOTO score of 60% in order to demonstrate improved functional ability as it relates to her knee pain.    Baseline 41% functional status    Time 8    Period Weeks    Status On-going    Target Date 01/30/21      PT LONG TERM GOAL #2   Title Pt will achieve WNL BIL knee flexion and extension AROM with 0-1/10 pain in order to promote normal gait pattern.    Baseline Achieved (11/21/2020)    Time 8    Period Weeks    Status Achieved      PT LONG TERM GOAL #3   Title Pt will report ability  to walk for >15 minutes with 0-1/10 pain in order to ambulate while grocery shopping without difficulty.    Baseline Patient reports 3/10 pain at rest, able to walk 6 minutes before rest break    Time 8    Period Weeks    Status On-going    Target Date 01/30/21      PT LONG TERM GOAL #4   Title Pt will achieve BIL tandem stance >15sec in order to demonstrate improved functional balance.    Baseline 10/31/2020: Achieved BIL tandem stance x 30sec    Time 8    Period Weeks    Status Achieved      PT LONG TERM GOAL #5   Title Pt will walk >/= 1200 ft with 6MWT using LRAD to improve community access    Baseline 870    Time 8    Period Weeks    Status Revised    Target Date 01/30/21      Additional Long Term Goals   Additional Long Term Goals Yes      PT LONG TERM GOAL #6   Title Pt will achieve global BIL hip strength of 5/5 in order to progress LE strengthening program.    Baseline continues to demostrate gross hip weakness    Time 8    Period Weeks    Status On-going    Target Date 01/30/21      PT LONG TERM GOAL #7   Title Patient will perfor 5xSTS in </= 15  seconds to indicate improve LE strength reduce any fall risk    Baseline 22 seconds    Time 8    Period Weeks    Status New    Target Date 01/30/21                   Plan - 12/11/20 1420     Clinical Impression Statement Challenged core strength using wobble cushion , seated on mat table. Added UE challenges and narrow LEs which was difficult for her. Continued with hip hinge with wall for feedback as well as gluteal strength and hip flexor stretching. Continued standing scapular bands. She tolerated the session well without c/o pain.    PT Treatment/Interventions ADLs/Self Care Home Management;Aquatic Therapy;Cryotherapy;Moist Heat;DME Instruction;Manual techniques;Balance training;Therapeutic exercise;Therapeutic activities;Functional mobility training;Stair training;Gait training;Patient/family education;Passive range of motion    PT Next Visit Plan Review HEP and progress PRN, progress LE and postural strengthening, hip flexor/quad stretching    PT Home Exercise Plan [facility]             Patient will benefit from skilled therapeutic intervention in order to improve the following deficits and impairments:  Abnormal gait, Difficulty walking, Decreased activity tolerance, Pain, Improper body mechanics, Decreased balance, Decreased mobility, Decreased range of motion, Decreased strength  Visit Diagnosis: Chronic pain of right knee  Chronic pain of left knee  Muscle weakness (generalized)  Unsteadiness on feet     Problem List Patient Active Problem List   Diagnosis Date Noted   Unilateral primary osteoarthritis, left knee 09/10/2020    Dorene Ar, PTA 12/11/2020, 2:25 PM  [facility] [address] [place], [name], [address] Phone: [phone]   Fax:  [phone]  Name: Toni Medina MRN: [id number] Date of Birth: 1931-07-20

## 2020-12-12 DIAGNOSIS — H52203 Unspecified astigmatism, bilateral: Secondary | ICD-10-CM | POA: Diagnosis not present

## 2020-12-12 DIAGNOSIS — Z961 Presence of intraocular lens: Secondary | ICD-10-CM | POA: Diagnosis not present

## 2020-12-13 DIAGNOSIS — E039 Hypothyroidism, unspecified: Secondary | ICD-10-CM | POA: Diagnosis not present

## 2020-12-13 DIAGNOSIS — R030 Elevated blood-pressure reading, without diagnosis of hypertension: Secondary | ICD-10-CM | POA: Diagnosis not present

## 2020-12-13 DIAGNOSIS — G8929 Other chronic pain: Secondary | ICD-10-CM | POA: Diagnosis not present

## 2020-12-13 DIAGNOSIS — Z5181 Encounter for therapeutic drug level monitoring: Secondary | ICD-10-CM | POA: Diagnosis not present

## 2020-12-13 DIAGNOSIS — M545 Low back pain, unspecified: Secondary | ICD-10-CM | POA: Diagnosis not present

## 2020-12-13 DIAGNOSIS — M25562 Pain in left knee: Secondary | ICD-10-CM | POA: Diagnosis not present

## 2020-12-18 ENCOUNTER — Encounter: Payer: Medicare Other | Admitting: Physical Therapy

## 2020-12-19 ENCOUNTER — Ambulatory Visit (INDEPENDENT_AMBULATORY_CARE_PROVIDER_SITE_OTHER): Payer: Medicare Other | Admitting: Orthopaedic Surgery

## 2020-12-19 ENCOUNTER — Other Ambulatory Visit: Payer: Self-pay

## 2020-12-19 ENCOUNTER — Encounter: Payer: Medicare Other | Admitting: Physical Therapy

## 2020-12-19 ENCOUNTER — Encounter: Payer: Self-pay | Admitting: Orthopaedic Surgery

## 2020-12-19 DIAGNOSIS — M25562 Pain in left knee: Secondary | ICD-10-CM

## 2020-12-19 DIAGNOSIS — G8929 Other chronic pain: Secondary | ICD-10-CM | POA: Diagnosis not present

## 2020-12-19 DIAGNOSIS — M1712 Unilateral primary osteoarthritis, left knee: Secondary | ICD-10-CM

## 2020-12-19 MED ORDER — METHYLPREDNISOLONE ACETATE 40 MG/ML IJ SUSP
40.0000 mg | INTRAMUSCULAR | Status: AC | PRN
  Administered 2020-12-19: 40 mg via INTRA_ARTICULAR

## 2020-12-19 MED ORDER — LIDOCAINE HCL 1 % IJ SOLN
3.0000 mL | INTRAMUSCULAR | Status: AC | PRN
  Administered 2020-12-19: 3 mL

## 2020-12-19 NOTE — Progress Notes (Signed)
Office Visit Note   Patient: Toni Medina           Date of Birth: November 06, 1931           MRN: [id number] Visit Date: 12/19/2020              Requested by: Seward Carol, MD [address] [place],  [place] [address] PCP: Seward Carol, MD   Assessment & Plan: Visit Diagnoses:  1. Chronic pain of left knee   2. Unilateral primary osteoarthritis, left knee     Plan: Per the patient's request I did place a steroid injection in her left knee without difficulty.  We can always do this again in 3 months.  She is still thinking about the possibility of knee replacement surgery in the future.  Follow-Up Instructions: Return in about 3 months (around 03/21/2021).   Orders:  Orders Placed This Encounter  Procedures   Large Joint Inj   No orders of the defined types were placed in this encounter.     Procedures: Large Joint Inj: L knee on 12/19/2020 10:12 AM Indications: diagnostic evaluation and pain Details: 22 G 1.5 in needle, superolateral approach  Arthrogram: No  Medications: 3 mL lidocaine 1 %; 40 mg methylPREDNISolone acetate 40 MG/ML Outcome: tolerated well, no immediate complications Procedure, treatment alternatives, risks and benefits explained, specific risks discussed. Consent was given by the patient. Immediately prior to procedure a time out was called to verify the correct patient, procedure, equipment, support staff and site/side marked as required. Patient was prepped and draped in the usual sterile fashion.      Clinical Data: No additional findings.   Subjective: Chief Complaint  Patient presents with   Left Knee - Pain, Follow-up  The patient is well-known to me.  She is an 87 year old female who has significant arthritis in her left knee.  She also has arthritis in the right knee but the left knee is what bothers her the most.  She came in today requesting steroid injection in her left knee which we had already set her up for coming  back today before the holidays to have a steroid shot in her knee.  She still ambulates with a cane.  She has had no acute change in her medical status.  She is still considering knee replacement surgery at some point in the future.  HPI  Review of Systems There is currently listed no headache, chest pain, shortness of breath, fever, chills, nausea, vomiting  Objective: Vital Signs: There were no vitals taken for this visit.  Physical Exam She is alert and orient x3 and in no acute distress Ortho Exam Examination of her left knee shows significant varus malalignment but no effusion today.  There is painful range of motion of her knee with patellofemoral crepitation. Specialty Comments:  No specialty comments available.  Imaging: No results found.   PMFS History: Patient Active Problem List   Diagnosis Date Noted   Unilateral primary osteoarthritis, left knee 09/10/2020   History reviewed. No pertinent past medical history.  Family History  Problem Relation Age of Onset   CAD Mother 43   CAD Father 81    Past Surgical History:  Procedure Laterality Date   BREAST EXCISIONAL BIOPSY Left 2014   Social History   Occupational History   Not on file  Tobacco Use   Smoking status: Not on file   Smokeless tobacco: Not on file  Substance and Sexual Activity   Alcohol use:  Not on file   Drug use: Not on file   Sexual activity: Not on file

## 2020-12-20 ENCOUNTER — Encounter: Payer: Self-pay | Admitting: Physical Therapy

## 2020-12-20 ENCOUNTER — Ambulatory Visit: Payer: Medicare Other | Admitting: Physical Therapy

## 2020-12-20 DIAGNOSIS — M25561 Pain in right knee: Secondary | ICD-10-CM | POA: Diagnosis not present

## 2020-12-20 DIAGNOSIS — M6281 Muscle weakness (generalized): Secondary | ICD-10-CM | POA: Diagnosis not present

## 2020-12-20 DIAGNOSIS — G8929 Other chronic pain: Secondary | ICD-10-CM

## 2020-12-20 DIAGNOSIS — R2681 Unsteadiness on feet: Secondary | ICD-10-CM

## 2020-12-20 DIAGNOSIS — M25562 Pain in left knee: Secondary | ICD-10-CM | POA: Diagnosis not present

## 2020-12-20 NOTE — Therapy (Signed)
[place], [name], [address] Phone: [phone]   Fax:  [phone]  Physical Therapy Treatment  Patient Details  Name: Toni Medina MRN: [id number] Date of Birth: 28-Jun-1931 Referring Provider (PT): Mcarthur Rossetti, MD   Encounter Date: 12/20/2020   PT End of Session - 12/20/20 1144     Visit Number 14    Number of Visits 20    Date for PT Re-Evaluation 01/30/21    Authorization Type MCR A&B    Authorization Time Period FOTO v6(captured 10/17/20), v10 (captured 11/21/20)    PT Start Time 1107    PT Stop Time 1145    PT Time Calculation (min) 38 min             History reviewed. No pertinent past medical history.  Past Surgical History:  Procedure Laterality Date   BREAST EXCISIONAL BIOPSY Left 2014    There were no vitals filed for this visit.   Subjective Assessment - 12/20/20 1110     Subjective I got a steroid shot yesterday in my left knee. He also removed fluid before the injections. It feels better.    Diagnostic tests X-ray knee 09/10/2020: 2 views of the left knee standing also shows the right knee on the AP view.  Both knees are windswept with significant loss of the medial joint space on the left knee and significant lateral joint space loss on the right knee.  There is significant patellofemoral narrowing on the left knee.  This is end-stage tricompartmental arthritis.    Currently in Pain? Yes    Pain Score 3     Pain Location Knee    Pain Orientation Left    Pain Descriptors / Indicators Aching    Pain Type Chronic pain    Aggravating Factors  standing and walking    Pain Relieving Factors meds             [facility] Adult PT Treatment/Exercise:   Therapeutic Exercise: - Spring board slastix shoulder extension in staggered stance -yellow spring row with bar in squat x 10  - hip hinge- tap the wall x 10- mod cues for posture  -hands leaning on free motion bar-  alt UE/LE (standing bird dog) -tandem stance 12 sec right foot back, 0 sec left foot back - needs 1 finger assist  -sit-stand x 10 with cues for eccentric control  - seated horizontal abduction yellow band x 15 , star pattern yellow band x 10  -Bridge - feet on airex pad with red band isometric at knees - x 20   -sidelying - hip flexion and extension x 15 each                    PT Short Term Goals - 10/31/20 1053       PT SHORT TERM GOAL #1   Title Pt will report understanding and adherence to her HEP in order to promote independence in the management of her primary impairments.    Baseline Pt reports daily adherence to her HEP (10/31/2020)    Time 4    Period Weeks    Status Achieved    Target Date 10/12/20               PT Long Term Goals - 12/05/20 1051       PT LONG TERM GOAL #1   Title Pt will achieve FOTO score of 60% in order to demonstrate improved functional ability  as it relates to her knee pain.    Baseline 41% functional status    Time 8    Period Weeks    Status On-going    Target Date 01/30/21      PT LONG TERM GOAL #2   Title Pt will achieve WNL BIL knee flexion and extension AROM with 0-1/10 pain in order to promote normal gait pattern.    Baseline Achieved (11/21/2020)    Time 8    Period Weeks    Status Achieved      PT LONG TERM GOAL #3   Title Pt will report ability to walk for >15 minutes with 0-1/10 pain in order to ambulate while grocery shopping without difficulty.    Baseline Patient reports 3/10 pain at rest, able to walk 6 minutes before rest break    Time 8    Period Weeks    Status On-going    Target Date 01/30/21      PT LONG TERM GOAL #4   Title Pt will achieve BIL tandem stance >15sec in order to demonstrate improved functional balance.    Baseline 10/31/2020: Achieved BIL tandem stance x 30sec    Time 8    Period Weeks    Status Achieved      PT LONG TERM GOAL #5   Title Pt will walk >/= 1200 ft with 6MWT using LRAD to  improve community access    Baseline 870    Time 8    Period Weeks    Status Revised    Target Date 01/30/21      Additional Long Term Goals   Additional Long Term Goals Yes      PT LONG TERM GOAL #6   Title Pt will achieve global BIL hip strength of 5/5 in order to progress LE strengthening program.    Baseline continues to demostrate gross hip weakness    Time 8    Period Weeks    Status On-going    Target Date 01/30/21      PT LONG TERM GOAL #7   Title Patient will perfor 5xSTS in </= 15 seconds to indicate improve LE strength reduce any fall risk    Baseline 22 seconds    Time 8    Period Weeks    Status New    Target Date 01/30/21                   Plan - 12/20/20 1149     Clinical Impression Statement Toni Medina reports knee injection yesterday and that it feels better today. Continued with standing core and balance as well as seated postural strengthening and supine hip strength. Pt tolerated session well with min c/o left knee pain.  Tandem stance 12 sec best without UE. She will be out of town after thanksgiving to assit daughter with knee surgery but will call to schedule her remaining sessions.    PT Treatment/Interventions ADLs/Self Care Home Management;Aquatic Therapy;Cryotherapy;Moist Heat;DME Instruction;Manual techniques;Balance training;Therapeutic exercise;Therapeutic activities;Functional mobility training;Stair training;Gait training;Patient/family education;Passive range of motion    PT Next Visit Plan Review HEP and progress PRN, progress LE and postural strengthening, hip flexor/quad stretching    PT Home Exercise Plan [facility]             Patient will benefit from skilled therapeutic intervention in order to improve the following deficits and impairments:  Abnormal gait, Difficulty walking, Decreased activity tolerance, Pain, Improper body mechanics, Decreased balance, Decreased mobility, Decreased range of motion, Decreased strength  Visit  Diagnosis: Chronic pain of right knee  Chronic pain of left knee  Muscle weakness (generalized)  Unsteadiness on feet     Problem List Patient Active Problem List   Diagnosis Date Noted   Unilateral primary osteoarthritis, left knee 09/10/2020    Dorene Ar, PTA 12/20/2020, 12:13 PM  [facility] [address] [place], [name], [address] Phone: [phone]   Fax:  [phone]  Name: Toni Medina MRN: [id number] Date of Birth: 08-05-1931

## 2020-12-25 ENCOUNTER — Other Ambulatory Visit: Payer: Self-pay

## 2020-12-25 ENCOUNTER — Ambulatory Visit: Payer: Medicare Other | Admitting: Physical Therapy

## 2020-12-25 ENCOUNTER — Encounter: Payer: Self-pay | Admitting: Physical Therapy

## 2020-12-25 DIAGNOSIS — G8929 Other chronic pain: Secondary | ICD-10-CM | POA: Diagnosis not present

## 2020-12-25 DIAGNOSIS — R2681 Unsteadiness on feet: Secondary | ICD-10-CM

## 2020-12-25 DIAGNOSIS — M25562 Pain in left knee: Secondary | ICD-10-CM

## 2020-12-25 DIAGNOSIS — M6281 Muscle weakness (generalized): Secondary | ICD-10-CM | POA: Diagnosis not present

## 2020-12-25 DIAGNOSIS — M25561 Pain in right knee: Secondary | ICD-10-CM | POA: Diagnosis not present

## 2020-12-25 NOTE — Therapy (Signed)
[place], [name], [address] Phone: [phone]   Fax:  [phone]  Physical Therapy Treatment  Patient Details  Name: Toni Medina MRN: [id number] Date of Birth: 08-10-1931 Referring Provider (PT): Mcarthur Rossetti, MD   Encounter Date: 12/25/2020   PT End of Session - 12/25/20 1411     Visit Number 15    Number of Visits 20    Date for PT Re-Evaluation 01/30/21    Authorization Type MCR A&B    Progress Note Due on Visit 20    PT Start Time 7654    PT Stop Time 1445    PT Time Calculation (min) 41 min    Activity Tolerance Patient tolerated treatment well    Behavior During Therapy [facility] for tasks assessed/performed             History reviewed. No pertinent past medical history.  Past Surgical History:  Procedure Laterality Date   BREAST EXCISIONAL BIOPSY Left 2014    There were no vitals filed for this visit.   Subjective Assessment - 12/25/20 1408     Subjective Patient reports she is doing well. States the exercises are going pretty good at home. She will be out of town the next few weeks.    Patient Stated Goals Shopping, cooking, baking    Currently in Pain? Yes    Pain Score 3    2-3/10   Pain Location Knee    Pain Orientation Left    Pain Descriptors / Indicators Aching    Pain Type Chronic pain    Pain Onset More than a month ago    Pain Frequency Intermittent    Aggravating Factors  standing and walking                [facility] PT Assessment - 12/25/20 0001       Strength   Right Hip ABduction 4-/5    Left Hip ABduction 4-/5                   [facility] Adult PT Treatment/Exercise:  Therapeutic Exercise: NuStep L5 x 5 min with LE only while taking subjective SLR 2 x 10 each Bridge 3 x 5 - feet elevated on BOSU Sidelying hip abduction 2 x 10 each Sit to stand holding 5# weight at chest 2 x 10 Standing bird dog with elevated table 2 x  10 Heel raises 2 x 10 Bent over hip extension with 3# ankle weights 2 x 10 each - patient standing on edge of 2" box LAQ with 3# 2 x 10 each Row with FM 3# 2 x 10  Manual Therapy: N/A  Neuromuscular re-ed: N/A  Therapeutic Activity: N/A  Modalities: N/A  Self Care: N/A                  PT Education - 12/25/20 1410     Education Details HEP    Person(s) Educated Patient    Methods Explanation;Demonstration;Verbal cues    Comprehension Verbalized understanding;Returned demonstration;Verbal cues required;Need further instruction              PT Short Term Goals - 10/31/20 1053       PT SHORT TERM GOAL #1   Title Pt will report understanding and adherence to her HEP in order to promote independence in the management of her primary impairments.    Baseline Pt reports daily adherence to her HEP (10/31/2020)    Time 4  Period Weeks    Status Achieved    Target Date 10/12/20               PT Long Term Goals - 12/05/20 1051       PT LONG TERM GOAL #1   Title Pt will achieve FOTO score of 60% in order to demonstrate improved functional ability as it relates to her knee pain.    Baseline 41% functional status    Time 8    Period Weeks    Status On-going    Target Date 01/30/21      PT LONG TERM GOAL #2   Title Pt will achieve WNL BIL knee flexion and extension AROM with 0-1/10 pain in order to promote normal gait pattern.    Baseline Achieved (11/21/2020)    Time 8    Period Weeks    Status Achieved      PT LONG TERM GOAL #3   Title Pt will report ability to walk for >15 minutes with 0-1/10 pain in order to ambulate while grocery shopping without difficulty.    Baseline Patient reports 3/10 pain at rest, able to walk 6 minutes before rest break    Time 8    Period Weeks    Status On-going    Target Date 01/30/21      PT LONG TERM GOAL #4   Title Pt will achieve BIL tandem stance >15sec in order to demonstrate improved functional  balance.    Baseline 10/31/2020: Achieved BIL tandem stance x 30sec    Time 8    Period Weeks    Status Achieved      PT LONG TERM GOAL #5   Title Pt will walk >/= 1200 ft with 6MWT using LRAD to improve community access    Baseline 870    Time 8    Period Weeks    Status Revised    Target Date 01/30/21      Additional Long Term Goals   Additional Long Term Goals Yes      PT LONG TERM GOAL #6   Title Pt will achieve global BIL hip strength of 5/5 in order to progress LE strengthening program.    Baseline continues to demostrate gross hip weakness    Time 8    Period Weeks    Status On-going    Target Date 01/30/21      PT LONG TERM GOAL #7   Title Patient will perfor 5xSTS in </= 15 seconds to indicate improve LE strength reduce any fall risk    Baseline 22 seconds    Time 8    Period Weeks    Status New    Target Date 01/30/21                   Plan - 12/25/20 1412     Clinical Impression Statement Patient tolerated therapy well with no adverse effects. Therapy focused on continued strength and postural strengthening to improve walking ability. Patient tolerated progression of exercises well and did not report increase in pain with therapy. She does continue to fatigue quickly with exercises and revert back to poor posture if she does not have support. No changes to HEP this visit. She would benefit from continued skilled PT to progress her mobility and strength in order to improve her walking ability and maximize functional level.    PT Treatment/Interventions ADLs/Self Care Home Management;Aquatic Therapy;Cryotherapy;Moist Heat;DME Instruction;Manual techniques;Balance training;Therapeutic exercise;Therapeutic activities;Functional mobility training;Stair training;Gait training;Patient/family education;Passive range  of motion    PT Next Visit Plan Review HEP and progress PRN, progress LE and postural strengthening, hip flexor/quad stretching    PT Home Exercise Plan  [id number]    Consulted and Agree with Plan of Care Patient             Patient will benefit from skilled therapeutic intervention in order to improve the following deficits and impairments:  Abnormal gait, Difficulty walking, Decreased activity tolerance, Pain, Improper body mechanics, Decreased balance, Decreased mobility, Decreased range of motion, Decreased strength  Visit Diagnosis: Chronic pain of right knee  Chronic pain of left knee  Muscle weakness (generalized)  Unsteadiness on feet     Problem List Patient Active Problem List   Diagnosis Date Noted   Unilateral primary osteoarthritis, left knee 09/10/2020    Hilda Blades, PT, DPT, LAT, ATC 12/25/20  2:55 PM Phone: [phone] Fax: [facility] [address] [place], [name], [address] Phone: [phone]   Fax:  [phone]  Name: Toni Medina MRN: [id number] Date of Birth: 09-Dec-1931

## 2020-12-26 ENCOUNTER — Encounter: Payer: Medicare Other | Admitting: Physical Therapy

## 2020-12-31 ENCOUNTER — Encounter: Payer: Medicare Other | Admitting: Physical Therapy

## 2021-01-02 ENCOUNTER — Encounter: Payer: Medicare Other | Admitting: Physical Therapy

## 2021-01-11 ENCOUNTER — Encounter: Payer: Medicare Other | Admitting: Physical Therapy

## 2021-01-16 ENCOUNTER — Encounter: Payer: Medicare Other | Admitting: Physical Therapy

## 2021-01-23 ENCOUNTER — Ambulatory Visit: Payer: Medicare Other | Attending: Orthopaedic Surgery | Admitting: Physical Therapy

## 2021-01-23 ENCOUNTER — Encounter: Payer: Self-pay | Admitting: Physical Therapy

## 2021-01-23 ENCOUNTER — Other Ambulatory Visit: Payer: Self-pay

## 2021-01-23 DIAGNOSIS — M25561 Pain in right knee: Secondary | ICD-10-CM | POA: Insufficient documentation

## 2021-01-23 DIAGNOSIS — G8929 Other chronic pain: Secondary | ICD-10-CM | POA: Insufficient documentation

## 2021-01-23 DIAGNOSIS — R2681 Unsteadiness on feet: Secondary | ICD-10-CM | POA: Diagnosis not present

## 2021-01-23 DIAGNOSIS — M6281 Muscle weakness (generalized): Secondary | ICD-10-CM | POA: Insufficient documentation

## 2021-01-23 DIAGNOSIS — M25562 Pain in left knee: Secondary | ICD-10-CM | POA: Diagnosis not present

## 2021-01-23 NOTE — Therapy (Signed)
[place], [name], [address] Phone: [phone]   Fax:  [phone]  Physical Therapy Treatment  Patient Details  Name: Toni Medina MRN: [id number] Date of Birth: 07-23-1931 Referring Provider (PT): Mcarthur Rossetti, MD   Encounter Date: 01/23/2021   PT End of Session - 01/23/21 1119     Visit Number 16    Number of Visits 20    Date for PT Re-Evaluation 01/30/21    Authorization Type MCR A&B    Authorization Time Period FOTO v6(captured 10/17/20), v10 (captured 11/21/20)    Progress Note Due on Visit 20    PT Start Time 1100    PT Stop Time 1145    PT Time Calculation (min) 45 min             History reviewed. No pertinent past medical history.  Past Surgical History:  Procedure Laterality Date   BREAST EXCISIONAL BIOPSY Left 2014    There were no vitals filed for this visit.   Subjective Assessment - 01/23/21 1116     Subjective 3/10 left knee. Right knee 2/10. Back is a dull ache    Currently in Pain? Yes    Pain Score 3     Pain Location Knee    Pain Orientation Right    Pain Descriptors / Indicators Aching    Aggravating Factors  standing and walking    Pain Relieving Factors meds                [facility] PT Assessment - 01/23/21 0001       Strength   Right Hip ABduction 4-/5    Left Hip ABduction 4/5                           [facility] Adult PT Treatment/Exercise - 01/23/21 0001       Transfers   Five time sit to stand comments  16.6            [facility] Adult PT Treatment/Exercise:  Therapeutic Exercise: NuStep L3 x 7 min with LE only while taking subjective Sit-stand x 15  SLR 2 x 10 each Bridge 3 x 5 - feet elevated on BOSU Sidelying hip abduction 2 x 10 each Standing bird dog with elevated table 2 x 10 Heel raises 2 x 10 Bent over hip extension with 3# ankle weights 2 x 10 each - patient standing on edge of 2" box LAQ with 3# 2  x 10 each Row with green band x 15 Red band extensions x 15           PT Short Term Goals - 10/31/20 1053       PT SHORT TERM GOAL #1   Title Pt will report understanding and adherence to her HEP in order to promote independence in the management of her primary impairments.    Baseline Pt reports daily adherence to her HEP (10/31/2020)    Time 4    Period Weeks    Status Achieved    Target Date 10/12/20               PT Long Term Goals - 12/05/20 1051       PT LONG TERM GOAL #1   Title Pt will achieve FOTO score of 60% in order to demonstrate improved functional ability as it relates to her knee pain.    Baseline 41% functional status    Time  8    Period Weeks    Status On-going    Target Date 01/30/21      PT LONG TERM GOAL #2   Title Pt will achieve WNL BIL knee flexion and extension AROM with 0-1/10 pain in order to promote normal gait pattern.    Baseline Achieved (11/21/2020)    Time 8    Period Weeks    Status Achieved      PT LONG TERM GOAL #3   Title Pt will report ability to walk for >15 minutes with 0-1/10 pain in order to ambulate while grocery shopping without difficulty.    Baseline Patient reports 3/10 pain at rest, able to walk 6 minutes before rest break    Time 8    Period Weeks    Status On-going    Target Date 01/30/21      PT LONG TERM GOAL #4   Title Pt will achieve BIL tandem stance >15sec in order to demonstrate improved functional balance.    Baseline 10/31/2020: Achieved BIL tandem stance x 30sec    Time 8    Period Weeks    Status Achieved      PT LONG TERM GOAL #5   Title Pt will walk >/= 1200 ft with 6MWT using LRAD to improve community access    Baseline 870    Time 8    Period Weeks    Status Revised    Target Date 01/30/21      Additional Long Term Goals   Additional Long Term Goals Yes      PT LONG TERM GOAL #6   Title Pt will achieve global BIL hip strength of 5/5 in order to progress LE strengthening program.     Baseline continues to demostrate gross hip weakness    Time 8    Period Weeks    Status On-going    Target Date 01/30/21      PT LONG TERM GOAL #7   Title Patient will perfor 5xSTS in </= 15 seconds to indicate improve LE strength reduce any fall risk    Baseline 22 seconds    Time 8    Period Weeks    Status New    Target Date 01/30/21                   Plan - 01/23/21 1233     Clinical Impression Statement Toni Medina arrives after nearly a month absence due to helping her sister after a surgery. She reports compliance with some HEP. She demonstrates improved left hip abduction strength and improved 5 x STS time. Continued with hip and core strengthening per POC. Pt is interested in another extension of POC due to her absence.    PT Treatment/Interventions ADLs/Self Care Home Management;Aquatic Therapy;Cryotherapy;Moist Heat;DME Instruction;Manual techniques;Balance training;Therapeutic exercise;Therapeutic activities;Functional mobility training;Stair training;Gait training;Patient/family education;Passive range of motion    PT Next Visit Plan Review HEP and progress PRN, progress LE and postural strengthening, hip flexor/quad stretching; re-eval at end of POC    PT Home Exercise Plan [facility]             Patient will benefit from skilled therapeutic intervention in order to improve the following deficits and impairments:  Abnormal gait, Difficulty walking, Decreased activity tolerance, Pain, Improper body mechanics, Decreased balance, Decreased mobility, Decreased range of motion, Decreased strength  Visit Diagnosis: Chronic pain of right knee  Chronic pain of left knee  Muscle weakness (generalized)  Unsteadiness on feet  Problem List Patient Active Problem List   Diagnosis Date Noted   Unilateral primary osteoarthritis, left knee 09/10/2020    Dorene Ar, [place] 01/23/2021, 12:36 PM  [facility] [address] [place], [name], [address] Phone: [phone]   Fax:  [phone]  Name: Toni Medina MRN: [id number] Date of Birth: 12-Jul-1931

## 2021-01-30 ENCOUNTER — Ambulatory Visit: Payer: Medicare Other | Admitting: Physical Therapy

## 2021-01-31 ENCOUNTER — Encounter: Payer: Self-pay | Admitting: Physical Therapy

## 2021-01-31 ENCOUNTER — Ambulatory Visit: Payer: Medicare Other | Admitting: Physical Therapy

## 2021-01-31 ENCOUNTER — Other Ambulatory Visit: Payer: Self-pay

## 2021-01-31 DIAGNOSIS — M25562 Pain in left knee: Secondary | ICD-10-CM | POA: Diagnosis not present

## 2021-01-31 DIAGNOSIS — M6281 Muscle weakness (generalized): Secondary | ICD-10-CM | POA: Diagnosis not present

## 2021-01-31 DIAGNOSIS — G8929 Other chronic pain: Secondary | ICD-10-CM | POA: Diagnosis not present

## 2021-01-31 DIAGNOSIS — M25561 Pain in right knee: Secondary | ICD-10-CM | POA: Diagnosis not present

## 2021-01-31 DIAGNOSIS — R2681 Unsteadiness on feet: Secondary | ICD-10-CM

## 2021-01-31 NOTE — Therapy (Signed)
[place], [name], [address] Phone: [phone]   Fax:  [phone]  Physical Therapy Treatment  Patient Details  Name: Toni Medina MRN: [id number] Date of Birth: November 05, 1931 Referring Provider (PT): Mcarthur Rossetti, MD   Encounter Date: 01/31/2021   PT End of Session - 01/31/21 1021     Visit Number 17    Number of Visits 20    Date for PT Re-Evaluation 01/30/21    Authorization Type MCR A&B    Authorization Time Period FOTO v6(captured 10/17/20), v10 (captured 11/21/20)  v12 (captured 12/05/20)    Progress Note Due on Visit 20    PT Start Time 1017    PT Stop Time 1059    PT Time Calculation (min) 42 min             History reviewed. No pertinent past medical history.  Past Surgical History:  Procedure Laterality Date   BREAST EXCISIONAL BIOPSY Left 2014    There were no vitals filed for this visit.   Subjective Assessment - 01/31/21 1020     Subjective The left knee is consistently unhappy and uncomfortable. Tylenol and meloxicam help.    Currently in Pain? Yes    Pain Score 3     Pain Location Knee    Pain Orientation Left    Pain Descriptors / Indicators Aching    Pain Type Chronic pain    Aggravating Factors  standing and walking    Pain Relieving Factors meds             [facility] Adult PT Treatment/Exercise:  Therapeutic Exercise: NuStep L4 x 5 min with LE only while taking subjective Sit-stand x 10 x 2 SLR 1 x 15 each Bridge 10 x 2  - feet elevated on BOSU Sidelying hip abduction 2 x 10 each Heel raises 2 x 10 Tandem stance with head turns - intermittent touch required to maintain balance  Blue rocker board A/P  x 1 minute - reducing UE to 1 finger touch Hip flexor stretch supine bilat x 1 min each  Row with green band x 15 Green band extensions x 15   Not performed today:  Standing bird dog with elevated table 2 x 10 Bent over hip extension with 3#  ankle weights 2 x 10 each - patient standing on edge of 2" box LAQ with 3# 2 x 10 each      PT Short Term Goals - 10/31/20 1053       PT SHORT TERM GOAL #1   Title Pt will report understanding and adherence to her HEP in order to promote independence in the management of her primary impairments.    Baseline Pt reports daily adherence to her HEP (10/31/2020)    Time 4    Period Weeks    Status Achieved    Target Date 10/12/20               PT Long Term Goals - 01/31/21 1021       PT LONG TERM GOAL #1   Title Pt will achieve FOTO score of 60% in order to demonstrate improved functional ability as it relates to her knee pain.    Baseline 41% functional status captured 12/05/20 on visit 12    Time 8    Period Weeks    Status On-going    Target Date 01/30/21      PT LONG TERM GOAL #2   Title Pt will  achieve WNL BIL knee flexion and extension AROM with 0-1/10 pain in order to promote normal gait pattern.    Baseline Achieved (11/21/2020)    Time 8    Period Weeks    Status Achieved      PT LONG TERM GOAL #3   Title Pt will report ability to walk for >15 minutes with 0-1/10 pain in order to ambulate while grocery shopping without difficulty.    Baseline Patient reports 3/10 pain at rest, able to walk 6 minutes before rest break    Time 8    Period Weeks    Status On-going    Target Date 01/30/21      PT LONG TERM GOAL #4   Title Pt will achieve BIL tandem stance >15sec in order to demonstrate improved functional balance.    Baseline 10/31/2020: Achieved BIL tandem stance x 30sec    Time 8    Period Weeks    Status Achieved      PT LONG TERM GOAL #5   Title Pt will walk >/= 1200 ft with 6MWT using LRAD to improve community access    Baseline 870    Time 8    Period Weeks    Status Revised    Target Date 01/30/21      PT LONG TERM GOAL #6   Title Pt will achieve global BIL hip strength of 5/5 in order to progress LE strengthening program.    Baseline continues  to demostrate gross hip weakness   , status 01/23/21 hip abduction 4-/5    Time 8    Period Weeks    Status On-going    Target Date 01/30/21      PT LONG TERM GOAL #7   Title Patient will perfor 5xSTS in </= 15 seconds to indicate improve LE strength reduce any fall risk    Baseline 22 seconds; status 01/23/21   16.6 seconds    Time 8    Period Weeks    Status On-going    Target Date 01/30/21                   Plan - 01/31/21 1029     Clinical Impression Statement Toni Medina arrives reporting left knee discomfort. Able to continue balance, postural strength, core and hip strength to work toward remaining LTGS. Jeani was absent for 1 month from PT and is requesting an extension of her POC to complete missed visits. She continues to demonstrate deficits in gait distance, hip strength and FOTO score.    PT Treatment/Interventions ADLs/Self Care Home Management;Aquatic Therapy;Cryotherapy;Moist Heat;DME Instruction;Manual techniques;Balance training;Therapeutic exercise;Therapeutic activities;Functional mobility training;Stair training;Gait training;Patient/family education;Passive range of motion    PT Next Visit Plan Patient is requesting Re-evaluation for possible extension of POC    PT Home Exercise Plan [facility]             Patient will benefit from skilled therapeutic intervention in order to improve the following deficits and impairments:  Abnormal gait, Difficulty walking, Decreased activity tolerance, Pain, Improper body mechanics, Decreased balance, Decreased mobility, Decreased range of motion, Decreased strength  Visit Diagnosis: Chronic pain of right knee  Muscle weakness (generalized)  Chronic pain of left knee  Unsteadiness on feet     Problem List Patient Active Problem List   Diagnosis Date Noted   Unilateral primary osteoarthritis, left knee 09/10/2020    Dorene Ar, PTA 01/31/2021, 10:58 AM  [place], [name],  [address] Phone: [phone]   Fax:  [phone]  Name: Toni Medina MRN: [id number] Date of Birth: 17-Aug-1931

## 2021-02-06 ENCOUNTER — Encounter: Payer: Medicare Other | Admitting: Physical Therapy

## 2021-02-07 ENCOUNTER — Other Ambulatory Visit: Payer: Self-pay

## 2021-02-07 ENCOUNTER — Ambulatory Visit: Payer: Medicare Other | Attending: Orthopaedic Surgery

## 2021-02-07 DIAGNOSIS — G8929 Other chronic pain: Secondary | ICD-10-CM

## 2021-02-07 DIAGNOSIS — M25561 Pain in right knee: Secondary | ICD-10-CM | POA: Insufficient documentation

## 2021-02-07 DIAGNOSIS — M6281 Muscle weakness (generalized): Secondary | ICD-10-CM

## 2021-02-07 DIAGNOSIS — M25562 Pain in left knee: Secondary | ICD-10-CM | POA: Diagnosis not present

## 2021-02-07 DIAGNOSIS — R2681 Unsteadiness on feet: Secondary | ICD-10-CM

## 2021-02-07 NOTE — Therapy (Signed)
[place], [name], [address] Phone: [phone]   Fax:  [phone]  Physical Therapy Treatment/ Re-Evaluation  Patient Details  Name: Toni Medina MRN: [id number] Date of Birth: 02-03-1932 Referring Provider (PT): Mcarthur Rossetti, MD   Encounter Date: 02/07/2021   PT End of Session - 02/07/21 1220     Visit Number 18    Number of Visits 24    Date for PT Re-Evaluation 03/21/21    Authorization Type MCR A&B    Authorization Time Period FOTO v6(captured 10/17/20), v10 (captured 11/21/20)  v12 (captured 12/05/20)    Progress Note Due on Visit 20    PT Start Time 1220    PT Stop Time 1305    PT Time Calculation (min) 45 min    Equipment Utilized During Treatment Gait belt    Activity Tolerance Patient tolerated treatment well    Behavior During Therapy [facility] for tasks assessed/performed             History reviewed. No pertinent past medical history.  Past Surgical History:  Procedure Laterality Date   BREAST EXCISIONAL BIOPSY Left 2014    There were no vitals filed for this visit.              [facility] Adult PT Treatment/Exercise:   Therapeutic Exercise: Straight leg dead lift with two 10# Kbs 3x10 Bridge with pillow squeeze 2x10 Sidelying hip abduction 2 x 10 each Heel raises 2 x 10 Ambulation x 6 minutes with cues for erect posture Hip flexor stretch supine bilat x 1 min each Standard plank 3x to exhaustion      Not performed today:  Standing bird dog with elevated table 2 x 10 Bent over hip extension with 3# ankle weights 2 x 10 each - patient standing on edge of 2" box LAQ with 3# 2 x 10 each NuStep L4 x 5 min with LE only while taking subjective Sit-stand x 10 x 2 SLR 1 x 15 each Row with green band x 15 Green band extensions x 15 Tandem stance with head turns - intermittent touch required to maintain balance  Blue rocker board A/P  x 1 minute - reducing UE  to 1 finger touch                    PT Short Term Goals - 10/31/20 1053       PT SHORT TERM GOAL #1   Title Pt will report understanding and adherence to her HEP in order to promote independence in the management of her primary impairments.    Baseline Pt reports daily adherence to her HEP (10/31/2020)    Time 4    Period Weeks    Status Achieved    Target Date 10/12/20               PT Long Term Goals - 01/31/21 1021       PT LONG TERM GOAL #1   Title Pt will achieve FOTO score of 60% in order to demonstrate improved functional ability as it relates to her knee pain.    Baseline 41% functional status captured 12/05/20 on visit 12    Time 8    Period Weeks    Status On-going    Target Date 01/30/21      PT LONG TERM GOAL #2   Title Pt will achieve WNL BIL knee flexion and extension AROM with 0-1/10 pain in order to promote normal  gait pattern.    Baseline Achieved (11/21/2020)    Time 8    Period Weeks    Status Achieved      PT LONG TERM GOAL #3   Title Pt will report ability to walk for >15 minutes with 0-1/10 pain in order to ambulate while grocery shopping without difficulty.    Baseline Patient reports 3/10 pain at rest, able to walk 6 minutes before rest break    Time 8    Period Weeks    Status On-going    Target Date 01/30/21      PT LONG TERM GOAL #4   Title Pt will achieve BIL tandem stance >15sec in order to demonstrate improved functional balance.    Baseline 10/31/2020: Achieved BIL tandem stance x 30sec    Time 8    Period Weeks    Status Achieved      PT LONG TERM GOAL #5   Title Pt will walk >/= 1200 ft with 6MWT using LRAD to improve community access    Baseline 870    Time 8    Period Weeks    Status Revised    Target Date 01/30/21      PT LONG TERM GOAL #6   Title Pt will achieve global BIL hip strength of 5/5 in order to progress LE strengthening program.    Baseline continues to demostrate gross hip weakness   , status  01/23/21 hip abduction 4-/5    Time 8    Period Weeks    Status On-going    Target Date 01/30/21      PT LONG TERM GOAL #7   Title Patient will perfor 5xSTS in </= 15 seconds to indicate improve LE strength reduce any fall risk    Baseline 22 seconds; status 01/23/21   16.6 seconds    Time 8    Period Weeks    Status On-going    Target Date 01/30/21                    Patient will benefit from skilled therapeutic intervention in order to improve the following deficits and impairments:     Visit Diagnosis: Chronic pain of right knee  Muscle weakness (generalized)  Chronic pain of left knee  Unsteadiness on feet     Problem List Patient Active Problem List   Diagnosis Date Noted   Unilateral primary osteoarthritis, left knee 09/10/2020    Cherie Ouch, PT 02/07/2021, 12:21 PM  [facility] [address] [place], [name], [address] Phone: [phone]   Fax:  [phone]  Name: Toni Medina MRN: [id number] Date of Birth: [birth year]

## 2021-02-14 ENCOUNTER — Ambulatory Visit: Payer: Medicare Other

## 2021-02-14 ENCOUNTER — Other Ambulatory Visit: Payer: Self-pay

## 2021-02-14 DIAGNOSIS — M25561 Pain in right knee: Secondary | ICD-10-CM | POA: Diagnosis not present

## 2021-02-14 DIAGNOSIS — M6281 Muscle weakness (generalized): Secondary | ICD-10-CM | POA: Diagnosis not present

## 2021-02-14 DIAGNOSIS — R2681 Unsteadiness on feet: Secondary | ICD-10-CM

## 2021-02-14 DIAGNOSIS — G8929 Other chronic pain: Secondary | ICD-10-CM

## 2021-02-14 DIAGNOSIS — M25562 Pain in left knee: Secondary | ICD-10-CM | POA: Diagnosis not present

## 2021-02-14 NOTE — Therapy (Signed)
[place], [name], [address] Phone: [phone]   Fax:  [phone]  Physical Therapy Treatment  Patient Details  Name: Toni Medina MRN: [id number] Date of Birth: August 09, 1931 Referring Provider (PT): Mcarthur Rossetti, MD   Encounter Date: 02/14/2021   PT End of Session - 02/14/21 1051     Visit Number 19    Number of Visits 24    Date for PT Re-Evaluation 03/21/21    Authorization Type MCR A&B    Authorization Time Period FOTO v6(captured 10/17/20), v10 (captured 11/21/20)  v12 (captured 12/05/20)    Progress Note Due on Visit 20    PT Start Time 1052    PT Stop Time 1130    PT Time Calculation (min) 38 min    Equipment Utilized During Treatment Gait belt    Activity Tolerance Patient tolerated treatment well    Behavior During Therapy [facility] for tasks assessed/performed             No past medical history on file.  Past Surgical History:  Procedure Laterality Date   BREAST EXCISIONAL BIOPSY Left 2014    There were no vitals filed for this visit.   Subjective Assessment - 02/14/21 1052     Subjective Pt reports that she is doing well today. She adds that her pain is a 4/10 today, attributing the increase in pain to taking her pain medication late today.    Currently in Pain? Yes    Pain Score 4     Pain Location Knee    Pain Orientation Right;Left    Pain Descriptors / Indicators Aching    Pain Type Chronic pain    Pain Onset More than a month ago    Pain Frequency Intermittent                     Therapeutic Exercise: Straight leg dead lift with 10# KB to 8-inch step 3x10 Bridge with pillow squeeze 2x10 Sidelying hip abduction 2 x 10 each Heel raises 2 x 10 Step-up with slow step-down on 6-inch step 2x10 BIL Supine 90/90 abdominal isometric with handhold resistance 4x30sec Hooklying diagonal curl-ups 3x10 BIL       Not performed today:  Standing bird  dog with elevated table 2 x 10 Bent over hip extension with 3# ankle weights 2 x 10 each - patient standing on edge of 2" box LAQ with 3# 2 x 10 each NuStep L4 x 5 min with LE only while taking subjective Sit-stand x 10 x 2 SLR 1 x 15 each Row with green band x 15 Green band extensions x 15 Tandem stance with head turns - intermittent touch required to maintain balance  Blue rocker board A/P  x 1 minute - reducing UE to 1 finger touch Ambulation x 6 minutes with cues for erect posture Hip flexor stretch supine bilat x 1 min each Standard plank 3x to exhaustion                     PT Short Term Goals - 10/31/20 1053       PT SHORT TERM GOAL #1   Title Pt will report understanding and adherence to her HEP in order to promote independence in the management of her primary impairments.    Baseline Pt reports daily adherence to her HEP (10/31/2020)    Time 4    Period Weeks    Status Achieved  Target Date 10/12/20               PT Long Term Goals - 02/07/21 1255       PT LONG TERM GOAL #1   Title Pt will achieve FOTO score of 60% in order to demonstrate improved functional ability as it relates to her knee pain.    Baseline 41% functional status captured 12/05/20 on visit 12    Time 8    Period Weeks    Status On-going    Target Date 01/30/21      PT LONG TERM GOAL #2   Title Pt will achieve WNL BIL knee flexion and extension AROM with 0-1/10 pain in order to promote normal gait pattern.    Baseline Achieved (11/21/2020)    Time 8    Period Weeks    Status Achieved      PT LONG TERM GOAL #3   Title Pt will report ability to walk for >15 minutes with 0-1/10 pain in order to ambulate while grocery shopping without difficulty.    Baseline Patient reports 3-4/10 pain at rest, able to walk 6 minutes with [facility] before rest break    Time 8    Period Weeks    Status On-going    Target Date 01/30/21      PT LONG TERM GOAL #4   Title Pt will achieve BIL  tandem stance >15sec in order to demonstrate improved functional balance.    Baseline 10/31/2020: Achieved BIL tandem stance x 30sec    Time 8    Period Weeks    Status Achieved      PT LONG TERM GOAL #5   Title Pt will walk >/= 1200 ft with 6MWT using LRAD to improve community access    Baseline 680 ftt with SPC (02/07/2021)    Time 8    Period Weeks    Status Revised    Target Date 01/30/21      PT LONG TERM GOAL #6   Title Pt will achieve global BIL hip strength of 5/5 in order to progress LE strengthening program.    Baseline continues to demostrate gross hip weakness; BIL global strength of 4/5    Time 8    Period Weeks    Status On-going    Target Date 01/30/21      PT LONG TERM GOAL #7   Title Patient will perfor 5xSTS in </= 15 seconds to indicate improve LE strength reduce any fall risk    Baseline 22 seconds; 16.6 seconds (01/24/2021); 16.5 seconds (02/07/2021)    Time 8    Period Weeks    Status On-going    Target Date 01/30/21                   Plan - 02/14/21 1105     Clinical Impression Statement Pt responded well to all interventions today, demonstrating good form and no increase in pain with selected exercises. Of note, the pt was unable to perform eccentric stepping on 8-inch step, so this exercises was performed with a 6-inch step, to which she responded well. She will continue to benefit from skilled PT to address her remaining impairments and return to her prior level of function with less limitation due to pain.    Personal Factors and Comorbidities Age    Examination-Activity Limitations Locomotion Level;Transfers;Bathing;Bend;Squat;Stairs;Stand    Examination-Participation Restrictions Cleaning;Laundry;Shop;Other    Stability/Clinical Decision Making Stable/Uncomplicated    Clinical Decision Making Low    Rehab  Potential Fair    PT Frequency 1x / week    PT Duration 6 weeks    PT Treatment/Interventions ADLs/Self Care Home Management;Aquatic  Therapy;Cryotherapy;Moist Heat;DME Instruction;Manual techniques;Balance training;Therapeutic exercise;Therapeutic activities;Functional mobility training;Stair training;Gait training;Patient/family education;Passive range of motion    PT Next Visit Plan Progress lumbar extensor/ glute strengthening, as well as quadriceps reinforcement in closed chain exercises to tolerance.    PT Home Exercise Plan [id number]    Consulted and Agree with Plan of Care Patient             Patient will benefit from skilled therapeutic intervention in order to improve the following deficits and impairments:  Abnormal gait, Difficulty walking, Decreased activity tolerance, Pain, Improper body mechanics, Decreased balance, Decreased mobility, Decreased range of motion, Decreased strength  Visit Diagnosis: Chronic pain of right knee  Muscle weakness (generalized)  Chronic pain of left knee  Unsteadiness on feet     Problem List Patient Active Problem List   Diagnosis Date Noted   Unilateral primary osteoarthritis, left knee 09/10/2020    Vanessa Shelton, PT, DPT 02/14/21 11:28 AM   [place] [facility] [address] [place], [name], [address] Phone: [phone]   Fax:  [phone]  Name: Toni Medina MRN: [id number] Date of Birth: 04-21-1931

## 2021-02-20 ENCOUNTER — Other Ambulatory Visit: Payer: Self-pay

## 2021-02-20 ENCOUNTER — Encounter: Payer: Self-pay | Admitting: Physical Therapy

## 2021-02-20 ENCOUNTER — Ambulatory Visit: Payer: Medicare Other | Admitting: Physical Therapy

## 2021-02-20 DIAGNOSIS — M25561 Pain in right knee: Secondary | ICD-10-CM | POA: Diagnosis not present

## 2021-02-20 DIAGNOSIS — R2681 Unsteadiness on feet: Secondary | ICD-10-CM | POA: Diagnosis not present

## 2021-02-20 DIAGNOSIS — G8929 Other chronic pain: Secondary | ICD-10-CM

## 2021-02-20 DIAGNOSIS — M25562 Pain in left knee: Secondary | ICD-10-CM | POA: Diagnosis not present

## 2021-02-20 DIAGNOSIS — M6281 Muscle weakness (generalized): Secondary | ICD-10-CM

## 2021-02-20 NOTE — Therapy (Signed)
Toni Med[place], [name], [address] Phone: [phone]   Fax:  [phone]  Physical Therapy Treatment  Patient Details  Name: Toni Medina MRN: [id number] Date of Birth: 1931/07/19 Referring Provider (PT): Mcarthur Rossetti, MD   Encounter Date: 02/20/2021   PT End of Session - 02/20/21 1242     Visit Number 20    Number of Visits 24    Date for PT Re-Evaluation 03/21/21    Authorization Type MCR A&B    Authorization Time Period FOTO v6(captured 10/17/20), v10 (captured 11/21/20)  v12 (captured 12/05/20)    Progress Note Due on Visit 20    PT Start Time 1232    PT Stop Time 1315    PT Time Calculation (min) 43 min             History reviewed. No pertinent past medical history.  Past Surgical History:  Procedure Laterality Date   BREAST EXCISIONAL BIOPSY Left 2014    There were no vitals filed for this visit.   Subjective Assessment - 02/20/21 1237     Subjective It is always the left knee. Excedrin helps in the afternoon.    Diagnostic tests X-ray knee 09/10/2020: 2 views of the left knee standing also shows the right knee on the AP view.  Both knees are windswept with significant loss of the medial joint space on the left knee and significant lateral joint space loss on the right knee.  There is significant patellofemoral narrowing on the left knee.  This is end-stage tricompartmental arthritis.    Currently in Pain? Yes    Pain Score 3     Pain Location Knee    Pain Orientation Left    Pain Descriptors / Indicators Aching    Pain Type Chronic pain    Pain Onset More than a month ago    Aggravating Factors  standing and walking    Pain Relieving Factors meds                Therapeutic Exercise: NuStep L4 x 5 min with LE only while taking subjective Standing bird dog at free motion 3# on ankles  Standing hip flexion 3# on ankles  Free motion squat to chair tap 10 x 2-  light tough at free motion  Bridge with pillow squeeze 2x10 Standing  hip abduction 2 x 10 each 3# Heel raises 2 x 10 Step-up with slow step-down on 6-inch step 1 x 10 on right, 1 x 10 on left - bilat UE         Not performed today:  Straight leg dead lift with 10# KB to 8-inch step 3x10 Bent over hip extension with 3# ankle weights 2 x 10 each - patient standing on edge of 2" box LAQ with 3# 2 x 10 each Hooklying diagonal curl-ups 3x10 BIL Sit-stand x 10 x 2 SLR 1 x 15 each Row with green band x 15 Green band extensions x 15 Tandem stance with head turns - intermittent touch required to maintain balance  Blue rocker board A/P  x 1 minute - reducing UE to 1 finger touch Ambulation x 6 minutes with cues for erect posture Hip flexor stretch supine bilat x 1 min each Standard plank 3x to exhaustion  Supine 90/90 abdominal isometric with handhold resistance 4x30sec        PT Short Term Goals - 10/31/20 1053       PT SHORT TERM GOAL #1  Title Pt will report understanding and adherence to her HEP in order to promote independence in the management of her primary impairments.    Baseline Pt reports daily adherence to her HEP (10/31/2020)    Time 4    Period Weeks    Status Achieved    Target Date 10/12/20               PT Long Term Goals - 02/07/21 1255       PT LONG TERM GOAL #1   Title Pt will achieve FOTO score of 60% in order to demonstrate improved functional ability as it relates to her knee pain.    Baseline 41% functional status captured 12/05/20 on visit 12    Time 8    Period Weeks    Status On-going    Target Date 01/30/21      PT LONG TERM GOAL #2   Title Pt will achieve WNL BIL knee flexion and extension AROM with 0-1/10 pain in order to promote normal gait pattern.    Baseline Achieved (11/21/2020)    Time 8    Period Weeks    Status Achieved      PT LONG TERM GOAL #3   Title Pt will report ability to walk for >15 minutes with 0-1/10 pain in  order to ambulate while grocery shopping without difficulty.    Baseline Patient reports 3-4/10 pain at rest, able to walk 6 minutes with [facility] before rest break    Time 8    Period Weeks    Status On-going    Target Date 01/30/21      PT LONG TERM GOAL #4   Title Pt will achieve BIL tandem stance >15sec in order to demonstrate improved functional balance.    Baseline 10/31/2020: Achieved BIL tandem stance x 30sec    Time 8    Period Weeks    Status Achieved      PT LONG TERM GOAL #5   Title Pt will walk >/= 1200 ft with 6MWT using LRAD to improve community access    Baseline 680 ftt with SPC (02/07/2021)    Time 8    Period Weeks    Status Revised    Target Date 01/30/21      PT LONG TERM GOAL #6   Title Pt will achieve global BIL hip strength of 5/5 in order to progress LE strengthening program.    Baseline continues to demostrate gross hip weakness; BIL global strength of 4/5    Time 8    Period Weeks    Status On-going    Target Date 01/30/21      PT LONG TERM GOAL #7   Title Patient will perfor 5xSTS in </= 15 seconds to indicate improve LE strength reduce any fall risk    Baseline 22 seconds; 16.6 seconds (01/24/2021); 16.5 seconds (02/07/2021)    Time 8    Period Weeks    Status On-going    Target Date 01/30/21                   Plan - 02/20/21 1310     Clinical Impression Statement [name] arrives with left knee pain. Continued with PT POC for closed chain strengthening. Difficulty completing 5 reps of step up on left LE due to knee pain. Trial of SI belt to see if beneficial for lumbar pain in standing. She reported no benefit.    PT Treatment/Interventions ADLs/Self Care Home Management;Aquatic Therapy;Cryotherapy;Moist Heat;DME Instruction;Manual techniques;Balance  training;Therapeutic exercise;Therapeutic activities;Functional mobility training;Stair training;Gait training;Patient/family education;Passive range of motion    PT Next Visit Plan Progress lumbar  extensor/ glute strengthening, as well as quadriceps reinforcement in closed chain exercises to tolerance.    PT Home Exercise Plan [id number]    Consulted and Agree with Plan of Care Patient             Patient will benefit from skilled therapeutic intervention in order to improve the following deficits and impairments:  Abnormal gait, Difficulty walking, Decreased activity tolerance, Pain, Improper body mechanics, Decreased balance, Decreased mobility, Decreased range of motion, Decreased strength  Visit Diagnosis: Chronic pain of right knee  Muscle weakness (generalized)  Chronic pain of left knee  Unsteadiness on feet     Problem List Patient Active Problem List   Diagnosis Date Noted   Unilateral primary osteoarthritis, left knee 09/10/2020    Dorene Ar, PTA 02/20/2021, 1:12 PM  [place], [name], [address] Phone: [phone]   Fax:  [phone]  Name: Anaya Bovee MRN: [id number] Date of Birth: 1931/07/16

## 2021-02-27 ENCOUNTER — Ambulatory Visit: Payer: Medicare Other

## 2021-02-27 ENCOUNTER — Other Ambulatory Visit: Payer: Self-pay

## 2021-02-27 DIAGNOSIS — R2681 Unsteadiness on feet: Secondary | ICD-10-CM

## 2021-02-27 DIAGNOSIS — M6281 Muscle weakness (generalized): Secondary | ICD-10-CM

## 2021-02-27 DIAGNOSIS — M25562 Pain in left knee: Secondary | ICD-10-CM | POA: Diagnosis not present

## 2021-02-27 DIAGNOSIS — M25561 Pain in right knee: Secondary | ICD-10-CM | POA: Diagnosis not present

## 2021-02-27 DIAGNOSIS — G8929 Other chronic pain: Secondary | ICD-10-CM | POA: Diagnosis not present

## 2021-02-27 NOTE — Therapy (Signed)
[place], [name], [address] Phone: [phone]   Fax:  [phone]  Physical Therapy Treatment  Patient Details  Name: Toni Medina MRN: [id number] Date of Birth: 19-Aug-1931 Referring Provider (PT): Mcarthur Rossetti, MD   Encounter Date: 02/27/2021   PT End of Session - 02/27/21 1137     Visit Number 21    Number of Visits 24    Date for PT Re-Evaluation 03/21/21    Authorization Type MCR A&B    Authorization Time Period FOTO v6(captured 10/17/20), v10 (captured 11/21/20)  v12 (captured 12/05/20)    PT Start Time 1137   Pt arrived 7 minutes late.   PT Stop Time 1215    PT Time Calculation (min) 38 min    Equipment Utilized During Treatment Gait belt    Activity Tolerance Patient tolerated treatment well    Behavior During Therapy WFL for tasks assessed/performed             History reviewed. No pertinent past medical history.  Past Surgical History:  Procedure Laterality Date   BREAST EXCISIONAL BIOPSY Left 2014    There were no vitals filed for this visit.   Subjective Assessment - 02/27/21 1144     Subjective Pt reports feeling well today, adding that she only has 2/10 Lt knee pain. She attributes the decrease in pain to taking her pain medication shortly before coming to therapy. She reports that her baseline pain has seen a pattern of improvement, but her pain with prolonged walking has remained the same since starting PT.    Currently in Pain? Yes    Pain Score 3     Pain Location Knee    Pain Orientation Left    Pain Descriptors / Indicators Aching    Pain Type Chronic pain    Pain Onset More than a month ago    Pain Frequency Intermittent               [facility] Adult PT Treatment:                                                DATE: 02/27/2021 Therapeutic Exercise: NuStep level 6 while collecting subjective information Standing Cybex hip abduction with 12.5# 2x10  BIL Standing Cybex hip extension with 12.5# 2x10 BIL Supine 90/90 abdominal isometric with handhold resistance 4x30sec Hooklying lower trunk rotation 2x10 Prone swimmers 2x10 Manual Therapy: N/A Neuromuscular re-ed: N/A Therapeutic Activity: N/A Modalities: N/A Self Care: N/A                          PT Education - 02/27/21 1200     Education Details Updated HEP. Pt educated on POC, intention to consult with PCM at end of POC if no further progress is met.    Person(s) Educated Patient    Methods Explanation;Demonstration;Handout    Comprehension Verbalized understanding;Returned demonstration              PT Short Term Goals - 10/31/20 1053       PT SHORT TERM GOAL #1   Title Pt will report understanding and adherence to her HEP in order to promote independence in the management of her primary impairments.    Baseline Pt reports daily adherence to her HEP (10/31/2020)    Time 4  Period Weeks    Status Achieved    Target Date 10/12/20               PT Long Term Goals - 02/07/21 1255       PT LONG TERM GOAL #1   Title Pt will achieve FOTO score of 60% in order to demonstrate improved functional ability as it relates to her knee pain.    Baseline 41% functional status captured 12/05/20 on visit 12    Time 8    Period Weeks    Status On-going    Target Date 01/30/21      PT LONG TERM GOAL #2   Title Pt will achieve WNL BIL knee flexion and extension AROM with 0-1/10 pain in order to promote normal gait pattern.    Baseline Achieved (11/21/2020)    Time 8    Period Weeks    Status Achieved      PT LONG TERM GOAL #3   Title Pt will report ability to walk for >15 minutes with 0-1/10 pain in order to ambulate while grocery shopping without difficulty.    Baseline Patient reports 3-4/10 pain at rest, able to walk 6 minutes with [facility] before rest break    Time 8    Period Weeks    Status On-going    Target Date 01/30/21      PT LONG  TERM GOAL #4   Title Pt will achieve BIL tandem stance >15sec in order to demonstrate improved functional balance.    Baseline 10/31/2020: Achieved BIL tandem stance x 30sec    Time 8    Period Weeks    Status Achieved      PT LONG TERM GOAL #5   Title Pt will walk >/= 1200 ft with 6MWT using LRAD to improve community access    Baseline 680 ftt with SPC (02/07/2021)    Time 8    Period Weeks    Status Revised    Target Date 01/30/21      PT LONG TERM GOAL #6   Title Pt will achieve global BIL hip strength of 5/5 in order to progress LE strengthening program.    Baseline continues to demostrate gross hip weakness; BIL global strength of 4/5    Time 8    Period Weeks    Status On-going    Target Date 01/30/21      PT LONG TERM GOAL #7   Title Patient will perfor 5xSTS in </= 15 seconds to indicate improve LE strength reduce any fall risk    Baseline 22 seconds; 16.6 seconds (01/24/2021); 16.5 seconds (02/07/2021)    Time 8    Period Weeks    Status On-going    Target Date 01/30/21                   Plan - 02/27/21 1153     Clinical Impression Statement Pt responded well to all closed chain hip and knee strengthening exercises today with good form and no increase in pain. She continues to work on upright posture with standing exercises and has seen visual improvements in this category. She will continue to benefit from skilled PT to address her primary impairments and return to her prior level of function with less limitation.    Personal Factors and Comorbidities Age    Examination-Activity Limitations Locomotion Level;Transfers;Bathing;Bend;Squat;Stairs;Stand    Examination-Participation Restrictions Cleaning;Laundry;Shop;Other    Stability/Clinical Decision Making Stable/Uncomplicated    Clinical Decision Making Low  Rehab Potential Fair    PT Frequency 1x / week    PT Duration 6 weeks    PT Treatment/Interventions ADLs/Self Care Home Management;Aquatic  Therapy;Cryotherapy;Moist Heat;DME Instruction;Manual techniques;Balance training;Therapeutic exercise;Therapeutic activities;Functional mobility training;Stair training;Gait training;Patient/family education;Passive range of motion    PT Next Visit Plan Progress lumbar extensor/ glute strengthening, as well as quadriceps reinforcement in closed chain exercises to tolerance.    PT Home Exercise Plan [id number]    Consulted and Agree with Plan of Care Patient             Patient will benefit from skilled therapeutic intervention in order to improve the following deficits and impairments:  Abnormal gait, Difficulty walking, Decreased activity tolerance, Pain, Improper body mechanics, Decreased balance, Decreased mobility, Decreased range of motion, Decreased strength  Visit Diagnosis: Chronic pain of right knee  Muscle weakness (generalized)  Chronic pain of left knee  Unsteadiness on feet     Problem List Patient Active Problem List   Diagnosis Date Noted   Unilateral primary osteoarthritis, left knee 09/10/2020    Cherie Ouch, PT 02/27/2021, 12:12 PM  [facility] [address] [place], [name], [address] Phone: [phone]   Fax:  [phone]  Name: Toni Medina MRN: [id number] Date of Birth: April 14, 1931

## 2021-03-06 ENCOUNTER — Ambulatory Visit: Payer: Medicare Other | Attending: Orthopaedic Surgery

## 2021-03-06 ENCOUNTER — Other Ambulatory Visit: Payer: Self-pay

## 2021-03-06 DIAGNOSIS — R2681 Unsteadiness on feet: Secondary | ICD-10-CM | POA: Insufficient documentation

## 2021-03-06 DIAGNOSIS — M25562 Pain in left knee: Secondary | ICD-10-CM | POA: Diagnosis not present

## 2021-03-06 DIAGNOSIS — G8929 Other chronic pain: Secondary | ICD-10-CM | POA: Insufficient documentation

## 2021-03-06 DIAGNOSIS — M25561 Pain in right knee: Secondary | ICD-10-CM | POA: Insufficient documentation

## 2021-03-06 DIAGNOSIS — M6281 Muscle weakness (generalized): Secondary | ICD-10-CM | POA: Diagnosis not present

## 2021-03-06 NOTE — Therapy (Signed)
[place], [name], [address] Phone: [phone]   Fax:  [phone]  Physical Therapy Treatment  Patient Details  Name: Toni Medina MRN: [id number] Date of Birth: 1931/08/08 Referring Provider (PT): Mcarthur Rossetti, MD   Encounter Date: 03/06/2021   PT End of Session - 03/06/21 1149     Visit Number 22    Number of Visits 24    Date for PT Re-Evaluation 03/21/21    Authorization Type MCR A&B    Authorization Time Period FOTO v6(captured 10/17/20), v10 (captured 11/21/20)  v12 (captured 12/05/20)    Progress Note Due on Visit 20    PT Start Time 1150   Pt arrived 20 minutes late to her appointment.   PT Stop Time 1215    PT Time Calculation (min) 25 min    Equipment Utilized During Treatment Gait belt    Activity Tolerance Patient tolerated treatment well    Behavior During Therapy WFL for tasks assessed/performed             History reviewed. No pertinent past medical history.  Past Surgical History:  Procedure Laterality Date   BREAST EXCISIONAL BIOPSY Left 2014    There were no vitals filed for this visit.   Subjective Assessment - 03/06/21 1153     Subjective Pt reports continued Lt knee pain today, rated as 3/10. She reports that she would like her HEP to include more exercises related to improving posture.    Currently in Pain? Yes    Pain Score 3     Pain Location Knee    Pain Orientation Left    Pain Descriptors / Indicators Aching    Pain Type Chronic pain    Pain Onset More than a month ago    Pain Frequency Intermittent                          [facility] Adult PT Treatment:                                                DATE: 03/06/2021 Therapeutic Exercise: Standing corner pec stretch 2x1 min Prone A with 2# dumbbells 2x10 Prone T with 2# dumbbells 2x10 Prone swimmers 2x20 Manual Therapy: N/A Neuromuscular re-ed: N/A Therapeutic  Activity: N/A Modalities: N/A Self Care: N/A               PT Education - 03/06/21 1207     Education Details Updated HEP    Person(s) Educated Patient    Methods Explanation;Demonstration;Handout    Comprehension Verbalized understanding;Returned demonstration              PT Short Term Goals - 10/31/20 1053       PT SHORT TERM GOAL #1   Title Pt will report understanding and adherence to her HEP in order to promote independence in the management of her primary impairments.    Baseline Pt reports daily adherence to her HEP (10/31/2020)    Time 4    Period Weeks    Status Achieved    Target Date 10/12/20               PT Long Term Goals - 02/07/21 1255       PT LONG TERM GOAL #1   Title Pt will achieve  FOTO score of 60% in order to demonstrate improved functional ability as it relates to her knee pain.    Baseline 41% functional status captured 12/05/20 on visit 12    Time 8    Period Weeks    Status On-going    Target Date 01/30/21      PT LONG TERM GOAL #2   Title Pt will achieve WNL BIL knee flexion and extension AROM with 0-1/10 pain in order to promote normal gait pattern.    Baseline Achieved (11/21/2020)    Time 8    Period Weeks    Status Achieved      PT LONG TERM GOAL #3   Title Pt will report ability to walk for >15 minutes with 0-1/10 pain in order to ambulate while grocery shopping without difficulty.    Baseline Patient reports 3-4/10 pain at rest, able to walk 6 minutes with [facility] before rest break    Time 8    Period Weeks    Status On-going    Target Date 01/30/21      PT LONG TERM GOAL #4   Title Pt will achieve BIL tandem stance >15sec in order to demonstrate improved functional balance.    Baseline 10/31/2020: Achieved BIL tandem stance x 30sec    Time 8    Period Weeks    Status Achieved      PT LONG TERM GOAL #5   Title Pt will walk >/= 1200 ft with 6MWT using LRAD to improve community access    Baseline 680 ftt  with SPC (02/07/2021)    Time 8    Period Weeks    Status Revised    Target Date 01/30/21      PT LONG TERM GOAL #6   Title Pt will achieve global BIL hip strength of 5/5 in order to progress LE strengthening program.    Baseline continues to demostrate gross hip weakness; BIL global strength of 4/5    Time 8    Period Weeks    Status On-going    Target Date 01/30/21      PT LONG TERM GOAL #7   Title Patient will perfor 5xSTS in </= 15 seconds to indicate improve LE strength reduce any fall risk    Baseline 22 seconds; 16.6 seconds (01/24/2021); 16.5 seconds (02/07/2021)    Time 8    Period Weeks    Status On-going    Target Date 01/30/21                   Plan - 03/06/21 1202     Clinical Impression Statement Pt arrived 20 minutes late to her appointment, which led to a truncated treatment session. She continues to have Lt knee pain, although she reports that she has noticed a pattern of decreased pain with activity. The pt requests that exercises performed today focus on upright posture. She responded well to selected exercises today, demonstrating good form and no increase in pain. She will continue to benefit from skilled PT to address her primary impairments and establish a new functional baseline.    Personal Factors and Comorbidities Age    Examination-Activity Limitations Locomotion Level;Transfers;Bathing;Bend;Squat;Stairs;Stand    Examination-Participation Restrictions Cleaning;Laundry;Shop;Other    Stability/Clinical Decision Making Stable/Uncomplicated    Clinical Decision Making Low    Rehab Potential Fair    PT Frequency 1x / week    PT Duration 6 weeks    PT Treatment/Interventions ADLs/Self Care Home Management;Aquatic Therapy;Cryotherapy;Moist Heat;DME Instruction;Manual techniques;Balance training;Therapeutic exercise;Therapeutic  activities;Functional mobility training;Stair training;Gait training;Patient/family education;Passive range of motion    PT Next  Visit Plan Progress lumbar extensor/ glute strengthening, as well as quadriceps reinforcement in closed chain exercises to tolerance.    PT Home Exercise Plan [id number]    Consulted and Agree with Plan of Care Patient             Patient will benefit from skilled therapeutic intervention in order to improve the following deficits and impairments:  Abnormal gait, Difficulty walking, Decreased activity tolerance, Pain, Improper body mechanics, Decreased balance, Decreased mobility, Decreased range of motion, Decreased strength  Visit Diagnosis: Chronic pain of right knee  Muscle weakness (generalized)  Chronic pain of left knee  Unsteadiness on feet     Problem List Patient Active Problem List   Diagnosis Date Noted   Unilateral primary osteoarthritis, left knee 09/10/2020    Cherie Ouch, PT 03/06/2021, 12:13 PM  [facility] [address] [place], [name], [address] Phone: [phone]   Fax:  [phone]  Name: Toni Medina MRN: [id number] Date of Birth: [birth year]

## 2021-03-13 ENCOUNTER — Other Ambulatory Visit: Payer: Self-pay

## 2021-03-13 ENCOUNTER — Ambulatory Visit: Payer: Medicare Other

## 2021-03-13 DIAGNOSIS — M6281 Muscle weakness (generalized): Secondary | ICD-10-CM

## 2021-03-13 DIAGNOSIS — M25561 Pain in right knee: Secondary | ICD-10-CM | POA: Diagnosis not present

## 2021-03-13 DIAGNOSIS — M25562 Pain in left knee: Secondary | ICD-10-CM

## 2021-03-13 DIAGNOSIS — R2681 Unsteadiness on feet: Secondary | ICD-10-CM

## 2021-03-13 DIAGNOSIS — G8929 Other chronic pain: Secondary | ICD-10-CM | POA: Diagnosis not present

## 2021-03-13 NOTE — Patient Instructions (Signed)
Pt instructed to consider Tai Chi classes after PT is over. Provided pt with information above.

## 2021-03-13 NOTE — Therapy (Signed)
[place], [name], [address] Phone: [phone]   Fax:  [phone]  Physical Therapy Treatment  Patient Details  Name: Toni Medina MRN: [id number] Date of Birth: Feb 19, 1931 Referring Provider (PT): Mcarthur Rossetti, MD   Encounter Date: 03/13/2021   PT End of Session - 03/13/21 1133     Visit Number 23    Number of Visits 24    Date for PT Re-Evaluation 03/21/21    Authorization Type MCR A&B    Authorization Time Period FOTO v6(captured 10/17/20), v10 (captured 11/21/20)  v12 (captured 12/05/20)    Progress Note Due on Visit 20    PT Start Time 1135    PT Stop Time 1215    PT Time Calculation (min) 40 min    Equipment Utilized During Treatment Gait belt    Activity Tolerance Patient tolerated treatment well    Behavior During Therapy [facility] for tasks assessed/performed             History reviewed. No pertinent past medical history.  Past Surgical History:  Procedure Laterality Date   BREAST EXCISIONAL BIOPSY Left 2014    There were no vitals filed for this visit.   Subjective Assessment - 03/13/21 1135     Subjective Pt reports that she has new onset of LBP this morning not associated with any change in activity or her HEP. She reports that she took her pain medication about an hour ago, which has helped to decrease her pain. She reports conitnued HEP adherence, although she has not participated in her walking program.    Currently in Pain? Yes    Pain Score 2     Pain Location Knee    Pain Orientation Left;Right    Pain Descriptors / Indicators Aching    Pain Type Chronic pain    Pain Onset More than a month ago    Pain Frequency Intermittent                      [facility] Adult PT Treatment:                                                DATE: 03/13/2021 Therapeutic Exercise: 5# kettlebell dead lift 3x10 Standing open books 2x5 BIL Supine 90/90 abdominal isometric  with handhold resistance 4x30sec Pallof press with 3# cable 2x10 with 5-sec hold BIL Pt education regarding Tai Chi Manual Therapy: N/A Neuromuscular re-ed: N/A Therapeutic Activity: N/A Modalities: N/A Self Care: N/A                     PT Short Term Goals - 10/31/20 1053       PT SHORT TERM GOAL #1   Title Pt will report understanding and adherence to her HEP in order to promote independence in the management of her primary impairments.    Baseline Pt reports daily adherence to her HEP (10/31/2020)    Time 4    Period Weeks    Status Achieved    Target Date 10/12/20               PT Long Term Goals - 02/07/21 1255       PT LONG TERM GOAL #1   Title Pt will achieve FOTO score of 60% in order to demonstrate improved functional ability  as it relates to her knee pain.    Baseline 41% functional status captured 12/05/20 on visit 12    Time 8    Period Weeks    Status On-going    Target Date 01/30/21      PT LONG TERM GOAL #2   Title Pt will achieve WNL BIL knee flexion and extension AROM with 0-1/10 pain in order to promote normal gait pattern.    Baseline Achieved (11/21/2020)    Time 8    Period Weeks    Status Achieved      PT LONG TERM GOAL #3   Title Pt will report ability to walk for >15 minutes with 0-1/10 pain in order to ambulate while grocery shopping without difficulty.    Baseline Patient reports 3-4/10 pain at rest, able to walk 6 minutes with [facility] before rest break    Time 8    Period Weeks    Status On-going    Target Date 01/30/21      PT LONG TERM GOAL #4   Title Pt will achieve BIL tandem stance >15sec in order to demonstrate improved functional balance.    Baseline 10/31/2020: Achieved BIL tandem stance x 30sec    Time 8    Period Weeks    Status Achieved      PT LONG TERM GOAL #5   Title Pt will walk >/= 1200 ft with 6MWT using LRAD to improve community access    Baseline 680 ftt with SPC (02/07/2021)    Time 8    Period  Weeks    Status Revised    Target Date 01/30/21      PT LONG TERM GOAL #6   Title Pt will achieve global BIL hip strength of 5/5 in order to progress LE strengthening program.    Baseline continues to demostrate gross hip weakness; BIL global strength of 4/5    Time 8    Period Weeks    Status On-going    Target Date 01/30/21      PT LONG TERM GOAL #7   Title Patient will perfor 5xSTS in </= 15 seconds to indicate improve LE strength reduce any fall risk    Baseline 22 seconds; 16.6 seconds (01/24/2021); 16.5 seconds (02/07/2021)    Time 8    Period Weeks    Status On-going    Target Date 01/30/21                   Plan - 03/13/21 1205     Clinical Impression Statement Pt responded well to all interventions today, demonstrating good form and no increase in pain with selected exercises. The pt was educated on the benefits of Tai Chi following the end of PT next week in the management of arthritic pain. She reports understanding of this plan. The pt will continue to benefit from skilled PT to address her primary impairments and establish a new functional baseline.    Personal Factors and Comorbidities Age    Examination-Activity Limitations Locomotion Level;Transfers;Bathing;Bend;Squat;Stairs;Stand    Examination-Participation Restrictions Cleaning;Laundry;Shop;Other    Stability/Clinical Decision Making Stable/Uncomplicated    Clinical Decision Making Low    Rehab Potential Fair    PT Frequency 1x / week    PT Duration 6 weeks    PT Treatment/Interventions ADLs/Self Care Home Management;Aquatic Therapy;Cryotherapy;Moist Heat;DME Instruction;Manual techniques;Balance training;Therapeutic exercise;Therapeutic activities;Functional mobility training;Stair training;Gait training;Patient/family education;Passive range of motion    PT Next Visit Plan Progress lumbar extensor/ glute strengthening, as well as quadriceps  reinforcement in closed chain exercises to tolerance; discharge     PT Home Exercise Plan [id number]    Consulted and Agree with Plan of Care Patient             Patient will benefit from skilled therapeutic intervention in order to improve the following deficits and impairments:  Abnormal gait, Difficulty walking, Decreased activity tolerance, Pain, Improper body mechanics, Decreased balance, Decreased mobility, Decreased range of motion, Decreased strength  Visit Diagnosis: Chronic pain of right knee  Muscle weakness (generalized)  Chronic pain of left knee  Unsteadiness on feet     Problem List Patient Active Problem List   Diagnosis Date Noted   Unilateral primary osteoarthritis, left knee 09/10/2020    Cherie Ouch, PT 03/13/2021, 12:14 PM  [facility] [address] [place], [name], [address] Phone: [phone]   Fax:  [phone]  Name: Vanellope Passmore MRN: [id number] Date of Birth: July 16, 1931

## 2021-03-19 DIAGNOSIS — K045 Chronic apical periodontitis: Secondary | ICD-10-CM | POA: Diagnosis not present

## 2021-03-20 ENCOUNTER — Ambulatory Visit (INDEPENDENT_AMBULATORY_CARE_PROVIDER_SITE_OTHER): Payer: Medicare Other | Admitting: Orthopaedic Surgery

## 2021-03-20 ENCOUNTER — Ambulatory Visit: Payer: Medicare Other

## 2021-03-20 ENCOUNTER — Other Ambulatory Visit: Payer: Self-pay

## 2021-03-20 DIAGNOSIS — M6281 Muscle weakness (generalized): Secondary | ICD-10-CM

## 2021-03-20 DIAGNOSIS — M25562 Pain in left knee: Secondary | ICD-10-CM

## 2021-03-20 DIAGNOSIS — G8929 Other chronic pain: Secondary | ICD-10-CM | POA: Diagnosis not present

## 2021-03-20 DIAGNOSIS — R2681 Unsteadiness on feet: Secondary | ICD-10-CM | POA: Diagnosis not present

## 2021-03-20 DIAGNOSIS — M25561 Pain in right knee: Secondary | ICD-10-CM | POA: Diagnosis not present

## 2021-03-20 DIAGNOSIS — M1712 Unilateral primary osteoarthritis, left knee: Secondary | ICD-10-CM | POA: Diagnosis not present

## 2021-03-20 NOTE — Progress Notes (Signed)
The patient is well-known to me.  She is an 88 year old with severe end-stage arthritis of both her knees but is really the left one that bothers her the most.  She has tried anti-inflammatories and steroid injections.  She has gotten to the point where her left knee pain is definitely affecting her mobility, her quality of life, and her actives daily living.  She does take meloxicam as well.  She does ambulate with a cane.  She is a thin individual and not a diabetic.  She does take thyroid medication.  We did go over previous x-rays of both knees showing the severe bone-on-bone wear of end-stage arthritis of both knees.  She, again, has had steroid injections in the left knee and has gotten to where she is wanting to consider knee replacement.  On exam she does have varus malalignment of her left knee and slight valgus malalignment of the right knee.  There is no effusion but painful range of motion and significant patellofemoral crepitation of the left knee.  I did talk about knee replacement surgery in detail.  I described the risks and benefits of the surgery.  I talked about what to expect with an intraoperative and postoperative course and showed her knee replacement model.  All questions and concerns were answered and addressed.  I did give her our surgery scheduler's card and she will let us know if she wishes to proceed with surgery.

## 2021-03-20 NOTE — Patient Instructions (Signed)
Pt instructed to follow up with her PCM later today regarding additional treatment alternatives to further address her knee pain and trunk posture.

## 2021-03-20 NOTE — Therapy (Signed)
[place], [name], [address] Phone: [phone]   Fax:  [phone]  Physical Therapy Treatment/ Discharge Summary  Patient Details  Name: Toni Medina MRN: [id number] Date of Birth: 11-16-1931 Referring Provider (PT): Mcarthur Rossetti, MD   Encounter Date: 03/20/2021   PT End of Session - 03/20/21 1135     Visit Number 24    Number of Visits 24    Authorization Type MCR A&B    Authorization Time Period FOTO v6(captured 10/17/20), v10 (captured 11/21/20)  v12 (captured 12/05/20)    PT Start Time 1130    PT Stop Time 1215    PT Time Calculation (min) 45 min    Equipment Utilized During Treatment Gait belt    Activity Tolerance Patient tolerated treatment well    Behavior During Therapy [facility] for tasks assessed/performed             History reviewed. No pertinent past medical history.  Past Surgical History:  Procedure Laterality Date   BREAST EXCISIONAL BIOPSY Left 2014    There were no vitals filed for this visit.             [facility] Adult PT Treatment:                                                DATE: 03/20/2021 Therapeutic Exercise: Knee plank 3x45 seconds Prone press-up 3x30sec Prone swimmers 3x10 Manual Therapy: N/A Neuromuscular re-ed: N/A Therapeutic Activity: 6MWT 5xSTS MMT testing FOTO Education on objective measures, POC, and updated HEP Modalities: N/A Self Care: N/A                     PT Short Term Goals - 10/31/20 1053       PT SHORT TERM GOAL #1   Title Pt will report understanding and adherence to her HEP in order to promote independence in the management of her primary impairments.    Baseline Pt reports daily adherence to her HEP (10/31/2020)    Time 4    Period Weeks    Status Achieved    Target Date 10/12/20               PT Long Term Goals - 02/07/21 1255       PT LONG TERM GOAL #1   Title Pt will achieve  FOTO score of 60% in order to demonstrate improved functional ability as it relates to her knee pain.    Baseline 41% functional status captured 12/05/20 on visit 12    Time 8    Period Weeks    Status On-going    Target Date 01/30/21      PT LONG TERM GOAL #2   Title Pt will achieve WNL BIL knee flexion and extension AROM with 0-1/10 pain in order to promote normal gait pattern.    Baseline Achieved (11/21/2020)    Time 8    Period Weeks    Status Achieved      PT LONG TERM GOAL #3   Title Pt will report ability to walk for >15 minutes with 0-1/10 pain in order to ambulate while grocery shopping without difficulty.    Baseline Patient reports 3-4/10 pain at rest, able to walk 6 minutes with SPC before rest break    Time 8    Period Weeks  Status On-going    Target Date 01/30/21      PT LONG TERM GOAL #4   Title Pt will achieve BIL tandem stance >15sec in order to demonstrate improved functional balance.    Baseline 10/31/2020: Achieved BIL tandem stance x 30sec    Time 8    Period Weeks    Status Achieved      PT LONG TERM GOAL #5   Title Pt will walk >/= 1200 ft with 6MWT using LRAD to improve community access    Baseline 680 ftt with SPC (02/07/2021)    Time 8    Period Weeks    Status Revised    Target Date 01/30/21      PT LONG TERM GOAL #6   Title Pt will achieve global BIL hip strength of 5/5 in order to progress LE strengthening program.    Baseline continues to demostrate gross hip weakness; BIL global strength of 4/5    Time 8    Period Weeks    Status On-going    Target Date 01/30/21      PT LONG TERM GOAL #7   Title Patient will perfor 5xSTS in </= 15 seconds to indicate improve LE strength reduce any fall risk    Baseline 22 seconds; 16.6 seconds (01/24/2021); 16.5 seconds (02/07/2021)    Time 8    Period Weeks    Status On-going    Target Date 01/30/21                    Patient will benefit from skilled therapeutic intervention in order to  improve the following deficits and impairments:     Visit Diagnosis: Chronic pain of right knee  Muscle weakness (generalized)  Chronic pain of left knee  Unsteadiness on feet     Problem List Patient Active Problem List   Diagnosis Date Noted   Unilateral primary osteoarthritis, left knee 09/10/2020    PHYSICAL THERAPY DISCHARGE SUMMARY  Visits from Start of Care: 24  Current functional level related to goals / functional outcomes: Pt has made functional improvement in balance and strength since start of PT   Remaining deficits: Pt continues to be limited in her 5xSTS, 6MWT, FOTO, and hip strength   Education / Equipment: HEP, theraband   Patient agrees to discharge. Patient goals were partially met. Patient is being discharged due to lack of progress.   Vanessa [place], PT, DPT 03/20/21 12:17 PM   [facility] [address] [place], [name], [address] Phone: [phone]   Fax:  [phone]  Name: Toni Medina MRN: [id number] Date of Birth: [birth year]

## 2021-03-21 DIAGNOSIS — H01001 Unspecified blepharitis right upper eyelid: Secondary | ICD-10-CM | POA: Diagnosis not present

## 2021-03-21 DIAGNOSIS — H16201 Unspecified keratoconjunctivitis, right eye: Secondary | ICD-10-CM | POA: Diagnosis not present

## 2021-03-21 DIAGNOSIS — H01005 Unspecified blepharitis left lower eyelid: Secondary | ICD-10-CM | POA: Diagnosis not present

## 2021-03-21 DIAGNOSIS — H02001 Unspecified entropion of right upper eyelid: Secondary | ICD-10-CM | POA: Diagnosis not present

## 2021-03-22 DIAGNOSIS — H02002 Unspecified entropion of right lower eyelid: Secondary | ICD-10-CM | POA: Diagnosis not present

## 2021-09-05 ENCOUNTER — Ambulatory Visit
Admission: RE | Admit: 2021-09-05 | Discharge: 2021-09-05 | Disposition: A | Discharge status: Home / Self Care | Payer: Medicare Other | Source: Ambulatory Visit | Attending: Internal Medicine | Admitting: Internal Medicine

## 2021-09-05 ENCOUNTER — Telehealth: Payer: Self-pay

## 2021-09-05 DIAGNOSIS — Z1231 Encounter for screening mammogram for malignant neoplasm of breast: Secondary | ICD-10-CM | POA: Diagnosis not present

## 2021-09-05 NOTE — Telephone Encounter (Signed)
Patient left voice mail regarding scheduling knee surgery after 10/22/21.  I called patient back and left voice mail for a return call.

## 2021-09-17 DIAGNOSIS — Z23 Encounter for immunization: Secondary | ICD-10-CM | POA: Diagnosis not present

## 2021-09-29 ENCOUNTER — Encounter: Payer: Self-pay | Admitting: Orthopaedic Surgery

## 2021-09-30 NOTE — Telephone Encounter (Signed)
Surgery is 10/25/21.  This is a bundle.

## 2021-10-08 ENCOUNTER — Encounter: Payer: Self-pay | Admitting: Orthopaedic Surgery

## 2021-10-08 ENCOUNTER — Telehealth: Payer: Self-pay | Admitting: *Deleted

## 2021-10-08 NOTE — Telephone Encounter (Signed)
Call to patient's daughter as requested to discuss surgery and answer questions. No answer and left VM requesting call back.

## 2021-10-08 NOTE — Telephone Encounter (Signed)
Attempted call back and no answer. Left VM.

## 2021-10-09 ENCOUNTER — Other Ambulatory Visit: Payer: Self-pay

## 2021-10-11 ENCOUNTER — Telehealth: Payer: Self-pay | Admitting: *Deleted

## 2021-10-11 NOTE — Telephone Encounter (Signed)
Pre-op call completed prior to upcoming Left total knee replacement on 11/01/21. Daughter has a question related to a dental implant scheduled in October. I informed her you typically like to wait 3 months post op for dental procedures. She asked if it was ok to have it done the week before the surgery? Would she need antibiotics if so?

## 2021-10-11 NOTE — Telephone Encounter (Signed)
Email to patient's daughter with information related to dental implant per Dr. Ninfa Linden.

## 2021-10-11 NOTE — Care Plan (Signed)
[facility] RNCM spoke with patient's daughter on Thursday, 10/10/21 and reviewed all information related to her mother's upcoming Left total knee arthroplasty with Dr. Ninfa Linden on 11/01/21. She is an Ortho bundle patient through [facility] and is agreeable to case management. She lives alone, but has a daughter and son that will be assisting after discharge home. She has a RW, 3in1/BSC, grabber for DME. Daughter requested a tub transfer bench, but explained Medicare does not typically cover for this surgery. She will order from [facility] prior to surgery. Anticipate HHPT will be needed after a short hospital stay. Referral made to [facility] after choice provided. Reviewed all post op care instructions with a copy sent to patient's daughter via email. Will continue to follow for needs.

## 2021-10-14 ENCOUNTER — Telehealth: Payer: Self-pay

## 2021-10-14 NOTE — Telephone Encounter (Signed)
Patient left voice mail stating she needs tooth replaced. TKA is scheduled for 11/01/21.  Is it okay to have done prior to surgery or should she wait? [phone]

## 2021-10-15 NOTE — Telephone Encounter (Signed)
I called patient and advised okay per Dr. Ninfa Linden.

## 2021-10-22 ENCOUNTER — Other Ambulatory Visit: Payer: Self-pay | Admitting: Physician Assistant

## 2021-10-22 DIAGNOSIS — M1712 Unilateral primary osteoarthritis, left knee: Secondary | ICD-10-CM

## 2021-10-23 NOTE — Patient Instructions (Signed)
DUE TO COVID-19 ONLY TWO VISITORS  (aged 86 and older)  ARE ALLOWED TO COME WITH YOU AND STAY IN THE WAITING ROOM ONLY DURING PRE OP AND PROCEDURE.   **NO VISITORS ARE ALLOWED IN THE SHORT STAY AREA OR RECOVERY ROOM!!**  IF YOU WILL BE ADMITTED INTO THE HOSPITAL YOU ARE ALLOWED ONLY FOUR SUPPORT PEOPLE DURING VISITATION HOURS ONLY (7 AM -8PM)   The support person(s) must pass our screening, gel in and out, and wear a mask at all times, including in the patient's room. Patients must also wear a mask when staff or their support person are in the room. Visitors GUEST BADGE MUST BE WORN VISIBLY  One adult visitor may remain with you overnight and MUST be in the room by 8 P.M.     Your procedure is scheduled on: 11/01/21   Report to [facility] Main Entrance    Report to admitting at : 5:45 AM   Call this number if you have problems the morning of surgery [phone]   Do not eat food :After Midnight.   After Midnight you may have the following liquids until : 5:00 AM DAY OF SURGERY  Water Black Coffee (sugar ok, NO MILK/CREAM OR CREAMERS)  Tea (sugar ok, NO MILK/CREAM OR CREAMERS) regular and decaf                             Plain Jell-O (NO RED)                                           Fruit ices (not with fruit pulp, NO RED)                                     Popsicles (NO RED)                                                                  Juice: apple, WHITE grape, WHITE cranberry Sports drinks like Gatorade (NO RED)              Drink  Ensure drink AT:  5:00 AM the day of surgery.     The day of surgery:  Drink ONE (1) Pre-Surgery Clear Ensure or G2 at AM the morning of surgery. Drink in one sitting. Do not sip.  This drink was given to you during your hospital  pre-op appointment visit. Nothing else to drink after completing the  Pre-Surgery Clear Ensure or G2.          If you have questions, please contact your surgeon's office    Oral Hygiene is also  important to reduce your risk of infection.                                    Remember - BRUSH YOUR TEETH THE MORNING OF SURGERY WITH YOUR REGULAR TOOTHPASTE   Do NOT smoke after Midnight   Take these medicines the morning of surgery with  A SIP OF WATER: synthroid  DO NOT TAKE ANY ORAL DIABETIC MEDICATIONS DAY OF YOUR SURGERY  Bring CPAP mask and tubing day of surgery.                              You may not have any metal on your body including hair pins, jewelry, and body piercing             Do not wear make-up, lotions, powders, perfumes/cologne, or deodorant  Do not wear nail polish including gel and S&S, artificial/acrylic nails, or any other type of covering on natural nails including finger and toenails. If you have artificial nails, gel coating, etc. that needs to be removed by a nail salon please have this removed prior to surgery or surgery may need to be canceled/ delayed if the surgeon/ anesthesia feels like they are unable to be safely monitored.   Do not shave  48 hours prior to surgery.    Do not bring valuables to the hospital. [place].   Contacts, dentures or bridgework may not be worn into surgery.   Bring small overnight bag day of surgery.   DO NOT [place]. PHARMACY WILL DISPENSE MEDICATIONS LISTED ON YOUR MEDICATION LIST TO YOU DURING YOUR ADMISSION [place]!    Patients discharged on the day of surgery will not be allowed to drive home.  Someone NEEDS to stay with you for the first 24 hours after anesthesia.   Special Instructions: Bring a copy of your healthcare power of attorney and living will documents         the day of surgery if you haven't scanned them before.              Please read over the following fact sheets you were given: IF YOU HAVE QUESTIONS ABOUT YOUR PRE-OP INSTRUCTIONS PLEASE CALL [phone]     [facility] - Preparing for Surgery Before  surgery, you can play an important role.  Because skin is not sterile, your skin needs to be as free of germs as possible.  You can reduce the number of germs on your skin by washing with CHG (chlorahexidine gluconate) soap before surgery.  CHG is an antiseptic cleaner which kills germs and bonds with the skin to continue killing germs even after washing. Please DO NOT use if you have an allergy to CHG or antibacterial soaps.  If your skin becomes reddened/irritated stop using the CHG and inform your nurse when you arrive at Short Stay. Do not shave (including legs and underarms) for at least 48 hours prior to the first CHG shower.  You may shave your face/neck. Please follow these instructions carefully:  1.  Shower with CHG Soap the night before surgery and the  morning of Surgery.  2.  If you choose to wash your hair, wash your hair first as usual with your  normal  shampoo.  3.  After you shampoo, rinse your hair and body thoroughly to remove the  shampoo.                           4.  Use CHG as you would any other liquid soap.  You can apply chg directly  to the skin and wash  Gently with a scrungie or clean washcloth.  5.  Apply the CHG Soap to your body ONLY FROM THE NECK DOWN.   Do not use on face/ open                           Wound or open sores. Avoid contact with eyes, ears mouth and genitals (private parts).                       Wash face,  Genitals (private parts) with your normal soap.             6.  Wash thoroughly, paying special attention to the area where your surgery  will be performed.  7.  Thoroughly rinse your body with warm water from the neck down.  8.  DO NOT shower/wash with your normal soap after using and rinsing off  the CHG Soap.                9.  Pat yourself dry with a clean towel.            10.  Wear clean pajamas.            11.  Place clean sheets on your bed the night of your first shower and do not  sleep with pets. Day of Surgery  : Do not apply any lotions/deodorants the morning of surgery.  Please wear clean clothes to the hospital/surgery center.  FAILURE TO FOLLOW THESE INSTRUCTIONS MAY RESULT IN THE CANCELLATION OF YOUR SURGERY PATIENT SIGNATURE_________________________________  NURSE SIGNATURE__________________________________  ________________________________________________________________________   Toni Medina  An incentive spirometer is a tool that can help keep your lungs clear and active. This tool measures how well you are filling your lungs with each breath. Taking long deep breaths may help reverse or decrease the chance of developing breathing (pulmonary) problems (especially infection) following: A long period of time when you are unable to move or be active. BEFORE THE PROCEDURE  If the spirometer includes an indicator to show your best effort, your nurse or respiratory therapist will set it to a desired goal. If possible, sit up straight or lean slightly forward. Try not to slouch. Hold the incentive spirometer in an upright position. INSTRUCTIONS FOR USE  Sit on the edge of your bed if possible, or sit up as far as you can in bed or on a chair. Hold the incentive spirometer in an upright position. Breathe out normally. Place the mouthpiece in your mouth and seal your lips tightly around it. Breathe in slowly and as deeply as possible, raising the piston or the ball toward the top of the column. Hold your breath for 3-5 seconds or for as long as possible. Allow the piston or ball to fall to the bottom of the column. Remove the mouthpiece from your mouth and breathe out normally. Rest for a few seconds and repeat Steps 1 through 7 at least 10 times every 1-2 hours when you are awake. Take your time and take a few normal breaths between deep breaths. The spirometer may include an indicator to show your best effort. Use the indicator as a goal to work toward during each repetition. After  each set of 10 deep breaths, practice coughing to be sure your lungs are clear. If you have an incision (the cut made at the time of surgery), support your incision when coughing by placing a pillow or rolled up towels  firmly against it. Once you are able to get out of bed, walk around indoors and cough well. You may stop using the incentive spirometer when instructed by your caregiver.  RISKS AND COMPLICATIONS Take your time so you do not get dizzy or light-headed. If you are in pain, you may need to take or ask for pain medication before doing incentive spirometry. It is harder to take a deep breath if you are having pain. AFTER USE Rest and breathe slowly and easily. It can be helpful to keep track of a log of your progress. Your caregiver can provide you with a simple table to help with this. If you are using the spirometer at home, follow these instructions: [place] IF:  You are having difficultly using the spirometer. You have trouble using the spirometer as often as instructed. Your pain medication is not giving enough relief while using the spirometer. You develop fever of 100.5 F (38.1 C) or higher. SEEK IMMEDIATE MEDICAL CARE IF:  You cough up bloody sputum that had not been present before. You develop fever of 102 F (38.9 C) or greater. You develop worsening pain at or near the incision site. MAKE SURE YOU:  Understand these instructions. Will watch your condition. Will get help right away if you are not doing well or get worse. Document Released: 06/02/2006 Document Revised: 04/14/2011 Document Reviewed: 08/03/2006 [facility] Patient Information 2014 [place], [place].   ________________________________________________________________________

## 2021-10-24 ENCOUNTER — Encounter (HOSPITAL_COMMUNITY): Payer: Self-pay

## 2021-10-24 ENCOUNTER — Other Ambulatory Visit: Payer: Self-pay

## 2021-10-24 ENCOUNTER — Encounter (HOSPITAL_COMMUNITY)
Admission: RE | Admit: 2021-10-24 | Discharge: 2021-10-24 | Disposition: A | Discharge status: Home / Self Care | Payer: Medicare Other | Source: Ambulatory Visit | Attending: Orthopaedic Surgery | Admitting: Orthopaedic Surgery

## 2021-10-24 VITALS — BP 158/70 | HR 73 | Temp 98.0°F | Ht 64.0 in | Wt 107.0 lb

## 2021-10-24 DIAGNOSIS — Z01818 Encounter for other preprocedural examination: Secondary | ICD-10-CM | POA: Diagnosis not present

## 2021-10-24 DIAGNOSIS — M1712 Unilateral primary osteoarthritis, left knee: Secondary | ICD-10-CM

## 2021-10-24 HISTORY — DX: Hypothyroidism, unspecified: E03.9

## 2021-10-24 HISTORY — DX: Unspecified osteoarthritis, unspecified site: M19.90

## 2021-10-24 LAB — CBC
HCT: 40 % (ref 36.0–46.0)
Hemoglobin: 13.2 g/dL (ref 12.0–15.0)
MCH: 30.4 pg (ref 26.0–34.0)
MCHC: 33 g/dL (ref 30.0–36.0)
MCV: 92.2 fL (ref 80.0–100.0)
Platelets: 277 10*3/uL (ref 150–400)
RBC: 4.34 MIL/uL (ref 3.87–5.11)
RDW: 13 % (ref 11.5–15.5)
WBC: 7.5 10*3/uL (ref 4.0–10.5)
nRBC: 0 % (ref 0.0–0.2)

## 2021-10-24 LAB — SURGICAL PCR SCREEN
MRSA, PCR: NEGATIVE
Staphylococcus aureus: NEGATIVE

## 2021-10-24 NOTE — Progress Notes (Signed)
For Short Stay: [place] appointment date: Date of COVID positive in last 9 days:  Bowel Prep reminder:   For Anesthesia: PCP - Dr. Seward Carol. Cardiologist -   Chest x-ray -  EKG -  Stress Test -  ECHO - 01/04/18 Cardiac Cath -  Pacemaker/ICD device last checked: Pacemaker orders received: Device Rep notified:  Spinal Cord Stimulator:  Sleep Study -  CPAP -   Fasting Blood Sugar -  Checks Blood Sugar _____ times a day Date and result of last Hgb A1c-  Blood Thinner Instructions: Aspirin Instructions: Last Dose:  Activity level: Can go up a flight of stairs and activities of daily living without stopping and without chest pain and/or shortness of breath   Able to exercise without chest pain and/or shortness of breath   Unable to go up a flight of stairs without chest pain and/or shortness of breath     Anesthesia review:   Patient denies shortness of breath, fever, cough and chest pain at PAT appointment   Patient verbalized understanding of instructions that were given to them at the PAT appointment. Patient was also instructed that they will need to review over the PAT instructions again at home before surgery.

## 2021-10-25 DIAGNOSIS — Z01818 Encounter for other preprocedural examination: Secondary | ICD-10-CM | POA: Diagnosis not present

## 2021-10-25 DIAGNOSIS — M170 Bilateral primary osteoarthritis of knee: Secondary | ICD-10-CM | POA: Diagnosis not present

## 2021-10-31 ENCOUNTER — Telehealth: Payer: Self-pay | Admitting: *Deleted

## 2021-10-31 NOTE — Care Plan (Signed)
[facility] RNCM call to patient to discuss her upcoming Left total knee arthroplasty with Dr. Ninfa Linden on 11/01/21. She is an Ortho bundle patient through [facility] and is agreeable to case management. She lives alone, but has a daughter that will be staying with her to assist after discharge. She has a RW, but will need a 3in1/BSC provided at hospital. Anticipate HHPT will be needed after a short hospital stay. Referral made to [facility] after choice provided. Reviewed all post op care instructions at length with patient and her daughter. Will continue to follow for needs.

## 2021-10-31 NOTE — H&P (Signed)
TOTAL KNEE ADMISSION H&P  Patient is being admitted for left total knee arthroplasty.  Subjective:  Chief Complaint:left knee pain.  HPI: Toni Medina, 88 y.o. female, has a history of pain and functional disability in the left knee due to arthritis and has failed non-surgical conservative treatments for greater than 12 weeks to includeNSAID's and/or analgesics, corticosteriod injections, viscosupplementation injections, flexibility and strengthening excercises, use of assistive devices, and activity modification.  Onset of symptoms was gradual, starting 5 years ago with gradually worsening course since that time. The patient noted prior procedures on the knee to include  arthroscopy on the left knee(s).  Patient currently rates pain in the left knee(s) at 10 out of 10 with activity. Patient has night pain, worsening of pain with activity and weight bearing, pain that interferes with activities of daily living, pain with passive range of motion, crepitus, and joint swelling.  Patient has evidence of subchondral sclerosis, periarticular osteophytes, and joint space narrowing by imaging studies. There is no active infection.  Patient Active Problem List   Diagnosis Date Noted   Unilateral primary osteoarthritis, left knee 09/10/2020   Past Medical History:  Diagnosis Date   Arthritis    Hypothyroidism     Past Surgical History:  Procedure Laterality Date   BREAST EXCISIONAL BIOPSY Left 2014   COLONOSCOPY     MENISCUS REPAIR      No current facility-administered medications for this encounter.   Current Outpatient Medications  Medication Sig Dispense Refill Last Dose   acetaminophen (TYLENOL) 500 MG tablet Take 500-1,000 mg by mouth every 6 (six) hours as needed (pain.).      aspirin-acetaminophen-caffeine (EXCEDRIN MIGRAINE) 250-250-65 MG tablet Take 1-2 tablets by mouth every 6 (six) hours as needed for headache.      Cholecalciferol (VITAMIN D) 125 MCG (5000 UT) CAPS Take 5,000  Units by mouth daily.      levothyroxine (SYNTHROID, LEVOTHROID) 75 MCG tablet Take 75 mcg by mouth daily before breakfast.      meloxicam (MOBIC) 15 MG tablet Take 15 mg by mouth in the morning.      Multiple Vitamin (MULTIVITAMIN WITH MINERALS) TABS tablet Take 1 tablet by mouth daily.      Allergies  Allergen Reactions   Doxycycline Rash    Rash on arms    Social History   Tobacco Use   Smoking status: Never   Smokeless tobacco: Never  Substance Use Topics   Alcohol use: Yes    Comment: occas.    Family History  Problem Relation Age of Onset   CAD Mother 48   CAD Father 80     Review of Systems  Objective:  Physical Exam Vitals reviewed.  Constitutional:      Appearance: Normal appearance. She is normal weight.  Eyes:     Extraocular Movements: Extraocular movements intact.     Pupils: Pupils are equal, round, and reactive to light.  Cardiovascular:     Rate and Rhythm: Normal rate and regular rhythm.     Pulses: Normal pulses.  Pulmonary:     Effort: Pulmonary effort is normal.     Breath sounds: Normal breath sounds.  Abdominal:     Palpations: Abdomen is soft.  Musculoskeletal:     Cervical back: Normal range of motion and neck supple.     Left knee: Effusion, bony tenderness and crepitus present. Decreased range of motion. Tenderness present over the medial joint line and lateral joint line. Abnormal alignment.  Neurological:  Mental Status: She is alert and oriented to person, place, and time.  Psychiatric:        Behavior: Behavior normal.     Vital signs in last 24 hours:    Labs:   Estimated body mass index is 18.37 kg/m as calculated from the following:   Height as of 10/24/21: '5\' 4"'$  (1.626 m).   Weight as of 10/24/21: 48.5 kg.   Imaging Review Plain radiographs demonstrate severe degenerative joint disease of the left knee(s). The overall alignment ismild varus. The bone quality appears to be fair for age and reported activity  level.      Assessment/Plan:  End stage arthritis, left knee   The patient history, physical examination, clinical judgment of the provider and imaging studies are consistent with end stage degenerative joint disease of the left knee(s) and total knee arthroplasty is deemed medically necessary. The treatment options including medical management, injection therapy arthroscopy and arthroplasty were discussed at length. The risks and benefits of total knee arthroplasty were presented and reviewed. The risks due to aseptic loosening, infection, stiffness, patella tracking problems, thromboembolic complications and other imponderables were discussed. The patient acknowledged the explanation, agreed to proceed with the plan and consent was signed. Patient is being admitted for inpatient treatment for surgery, pain control, PT, OT, prophylactic antibiotics, VTE prophylaxis, progressive ambulation and ADL's and discharge planning. The patient is planning to be discharged home with home health services

## 2021-10-31 NOTE — Telephone Encounter (Signed)
Ortho bundle pre-op call completed. 

## 2021-11-01 ENCOUNTER — Observation Stay (HOSPITAL_COMMUNITY): Payer: Medicare Other

## 2021-11-01 ENCOUNTER — Observation Stay (HOSPITAL_COMMUNITY)
Admission: RE | Admit: 2021-11-01 | Discharge: 2021-11-02 | Disposition: A | Discharge status: Home Health | Payer: Medicare Other | Attending: Orthopaedic Surgery | Admitting: Orthopaedic Surgery

## 2021-11-01 ENCOUNTER — Encounter (HOSPITAL_COMMUNITY): Payer: Self-pay | Admitting: Orthopaedic Surgery

## 2021-11-01 ENCOUNTER — Ambulatory Visit (HOSPITAL_COMMUNITY): Payer: Medicare Other | Admitting: Anesthesiology

## 2021-11-01 ENCOUNTER — Other Ambulatory Visit: Payer: Self-pay

## 2021-11-01 ENCOUNTER — Ambulatory Visit (HOSPITAL_BASED_OUTPATIENT_CLINIC_OR_DEPARTMENT_OTHER): Payer: Medicare Other | Admitting: Anesthesiology

## 2021-11-01 ENCOUNTER — Encounter (HOSPITAL_COMMUNITY)
Admission: RE | Disposition: A | Discharge status: Home Health | Payer: Self-pay | Source: Home / Self Care | Attending: Orthopaedic Surgery

## 2021-11-01 DIAGNOSIS — Z9889 Other specified postprocedural states: Secondary | ICD-10-CM | POA: Diagnosis not present

## 2021-11-01 DIAGNOSIS — E039 Hypothyroidism, unspecified: Secondary | ICD-10-CM

## 2021-11-01 DIAGNOSIS — Z96652 Presence of left artificial knee joint: Secondary | ICD-10-CM | POA: Diagnosis not present

## 2021-11-01 DIAGNOSIS — Z7982 Long term (current) use of aspirin: Secondary | ICD-10-CM | POA: Diagnosis not present

## 2021-11-01 DIAGNOSIS — Z471 Aftercare following joint replacement surgery: Secondary | ICD-10-CM | POA: Diagnosis not present

## 2021-11-01 DIAGNOSIS — M1712 Unilateral primary osteoarthritis, left knee: Principal | ICD-10-CM | POA: Insufficient documentation

## 2021-11-01 DIAGNOSIS — Z96642 Presence of left artificial hip joint: Secondary | ICD-10-CM | POA: Diagnosis not present

## 2021-11-01 DIAGNOSIS — Z79899 Other long term (current) drug therapy: Secondary | ICD-10-CM | POA: Insufficient documentation

## 2021-11-01 DIAGNOSIS — G8918 Other acute postprocedural pain: Secondary | ICD-10-CM | POA: Diagnosis not present

## 2021-11-01 HISTORY — PX: TOTAL KNEE ARTHROPLASTY: SHX125

## 2021-11-01 LAB — TYPE AND SCREEN
ABO/RH(D): O NEG
Antibody Screen: NEGATIVE

## 2021-11-01 LAB — ABO/RH: ABO/RH(D): O NEG

## 2021-11-01 SURGERY — ARTHROPLASTY, KNEE, TOTAL
Anesthesia: Spinal | Site: Knee | Laterality: Left

## 2021-11-01 MED ORDER — ALUM & MAG HYDROXIDE-SIMETH 200-200-20 MG/5ML PO SUSP: 30.0000 mL | ORAL | Status: DC | PRN

## 2021-11-01 MED ORDER — ONDANSETRON HCL 4 MG/2ML IJ SOLN
INTRAMUSCULAR | Status: AC
  Filled 2021-11-01: qty 2

## 2021-11-01 MED ORDER — CEFAZOLIN SODIUM-DEXTROSE 2-4 GM/100ML-% IV SOLN
2.0000 g | INTRAVENOUS | Status: AC
  Administered 2021-11-01: 2 g via INTRAVENOUS
  Filled 2021-11-01: qty 100

## 2021-11-01 MED ORDER — STERILE WATER FOR IRRIGATION IR SOLN
Status: DC | PRN
  Administered 2021-11-01: 2000 mL

## 2021-11-01 MED ORDER — VITAMIN D 25 MCG (1000 UNIT) PO TABS
5000.0000 [IU] | ORAL_TABLET | Freq: Every day | ORAL | Status: DC
  Administered 2021-11-02: 5000 [IU] via ORAL
  Filled 2021-11-01: qty 5

## 2021-11-01 MED ORDER — LEVOTHYROXINE SODIUM 75 MCG PO TABS
75.0000 ug | ORAL_TABLET | Freq: Every day | ORAL | Status: DC
  Administered 2021-11-02: 75 ug via ORAL
  Filled 2021-11-01: qty 1

## 2021-11-01 MED ORDER — METHOCARBAMOL 500 MG IVPB - SIMPLE MED
INTRAVENOUS | Status: AC
  Filled 2021-11-01: qty 55

## 2021-11-01 MED ORDER — PROPOFOL 1000 MG/100ML IV EMUL
INTRAVENOUS | Status: AC
  Filled 2021-11-01: qty 100

## 2021-11-01 MED ORDER — METOCLOPRAMIDE HCL 5 MG/ML IJ SOLN: 5.0000 mg | Freq: Three times a day (TID) | INTRAMUSCULAR | Status: DC | PRN

## 2021-11-01 MED ORDER — METHOCARBAMOL 500 MG PO TABS
500.0000 mg | ORAL_TABLET | Freq: Four times a day (QID) | ORAL | Status: DC | PRN
  Administered 2021-11-01 – 2021-11-02 (×3): 500 mg via ORAL
  Filled 2021-11-01 (×3): qty 1

## 2021-11-01 MED ORDER — OXYCODONE HCL 5 MG PO TABS: 10.0000 mg | ORAL_TABLET | ORAL | Status: DC | PRN

## 2021-11-01 MED ORDER — ONDANSETRON HCL 4 MG PO TABS: 4.0000 mg | ORAL_TABLET | Freq: Four times a day (QID) | ORAL | Status: DC | PRN

## 2021-11-01 MED ORDER — BUPIVACAINE-EPINEPHRINE (PF) 0.25% -1:200000 IJ SOLN
INTRAMUSCULAR | Status: AC
  Filled 2021-11-01: qty 30

## 2021-11-01 MED ORDER — PROPOFOL 10 MG/ML IV BOLUS
INTRAVENOUS | Status: DC | PRN
  Administered 2021-11-01: 10 mg via INTRAVENOUS
  Administered 2021-11-01: 20 mg via INTRAVENOUS

## 2021-11-01 MED ORDER — FENTANYL CITRATE PF 50 MCG/ML IJ SOSY
25.0000 ug | PREFILLED_SYRINGE | INTRAMUSCULAR | Status: DC | PRN
  Administered 2021-11-01 (×2): 25 ug via INTRAVENOUS

## 2021-11-01 MED ORDER — METHOCARBAMOL 500 MG IVPB - SIMPLE MED
500.0000 mg | Freq: Four times a day (QID) | INTRAVENOUS | Status: DC | PRN
  Administered 2021-11-01: 500 mg via INTRAVENOUS

## 2021-11-01 MED ORDER — CHLORHEXIDINE GLUCONATE 0.12 % MT SOLN
15.0000 mL | Freq: Once | OROMUCOSAL | Status: AC
  Administered 2021-11-01: 15 mL via OROMUCOSAL

## 2021-11-01 MED ORDER — SODIUM CHLORIDE 0.9 % IR SOLN
Status: DC | PRN
  Administered 2021-11-01: 1000 mL

## 2021-11-01 MED ORDER — ACETAMINOPHEN 500 MG PO TABS: 500.0000 mg | ORAL_TABLET | Freq: Four times a day (QID) | ORAL | Status: DC | PRN

## 2021-11-01 MED ORDER — MENTHOL 3 MG MT LOZG: 1.0000 | LOZENGE | OROMUCOSAL | Status: DC | PRN

## 2021-11-01 MED ORDER — PROPOFOL 500 MG/50ML IV EMUL
INTRAVENOUS | Status: DC | PRN
  Administered 2021-11-01: 75 ug/kg/min via INTRAVENOUS

## 2021-11-01 MED ORDER — METOCLOPRAMIDE HCL 5 MG PO TABS: 5.0000 mg | ORAL_TABLET | Freq: Three times a day (TID) | ORAL | Status: DC | PRN

## 2021-11-01 MED ORDER — PHENYLEPHRINE 80 MCG/ML (10ML) SYRINGE FOR IV PUSH (FOR BLOOD PRESSURE SUPPORT)
PREFILLED_SYRINGE | INTRAVENOUS | Status: AC
  Filled 2021-11-01: qty 10

## 2021-11-01 MED ORDER — ORAL CARE MOUTH RINSE: 15.0000 mL | Freq: Once | OROMUCOSAL | Status: AC

## 2021-11-01 MED ORDER — SODIUM CHLORIDE 0.9 % IV SOLN: INTRAVENOUS | Status: DC

## 2021-11-01 MED ORDER — POVIDONE-IODINE 10 % EX SWAB
2.0000 | Freq: Once | CUTANEOUS | Status: AC
  Administered 2021-11-01: 2 via TOPICAL

## 2021-11-01 MED ORDER — PHENYLEPHRINE HCL (PRESSORS) 10 MG/ML IV SOLN
INTRAVENOUS | Status: AC
  Filled 2021-11-01: qty 1

## 2021-11-01 MED ORDER — TRANEXAMIC ACID-NACL 1000-0.7 MG/100ML-% IV SOLN
1000.0000 mg | INTRAVENOUS | Status: AC
  Administered 2021-11-01: 1000 mg via INTRAVENOUS
  Filled 2021-11-01: qty 100

## 2021-11-01 MED ORDER — ASPIRIN 81 MG PO CHEW
81.0000 mg | CHEWABLE_TABLET | Freq: Two times a day (BID) | ORAL | Status: DC
  Administered 2021-11-01 – 2021-11-02 (×2): 81 mg via ORAL
  Filled 2021-11-01 (×2): qty 1

## 2021-11-01 MED ORDER — ACETAMINOPHEN 325 MG PO TABS
325.0000 mg | ORAL_TABLET | Freq: Four times a day (QID) | ORAL | Status: DC | PRN
  Administered 2021-11-02 (×2): 650 mg via ORAL
  Filled 2021-11-01 (×2): qty 2

## 2021-11-01 MED ORDER — OXYCODONE HCL 5 MG PO TABS
5.0000 mg | ORAL_TABLET | ORAL | Status: DC | PRN
  Administered 2021-11-01 (×2): 5 mg via ORAL
  Administered 2021-11-02: 10 mg via ORAL
  Administered 2021-11-02 (×2): 5 mg via ORAL
  Filled 2021-11-01 (×2): qty 1
  Filled 2021-11-01: qty 2
  Filled 2021-11-01: qty 1

## 2021-11-01 MED ORDER — PANTOPRAZOLE SODIUM 40 MG PO TBEC
40.0000 mg | DELAYED_RELEASE_TABLET | Freq: Every day | ORAL | Status: DC
  Administered 2021-11-02: 40 mg via ORAL
  Filled 2021-11-01: qty 1

## 2021-11-01 MED ORDER — FENTANYL CITRATE PF 50 MCG/ML IJ SOSY
50.0000 ug | PREFILLED_SYRINGE | INTRAMUSCULAR | Status: DC
  Administered 2021-11-01: 50 ug via INTRAVENOUS
  Filled 2021-11-01: qty 2

## 2021-11-01 MED ORDER — PHENYLEPHRINE HCL-NACL 20-0.9 MG/250ML-% IV SOLN
INTRAVENOUS | Status: DC | PRN
  Administered 2021-11-01: 25 ug/min via INTRAVENOUS

## 2021-11-01 MED ORDER — POLYETHYLENE GLYCOL 3350 17 G PO PACK: 17.0000 g | PACK | Freq: Every day | ORAL | Status: DC | PRN

## 2021-11-01 MED ORDER — HYDROMORPHONE HCL 1 MG/ML IJ SOLN: 0.5000 mg | INTRAMUSCULAR | Status: DC | PRN

## 2021-11-01 MED ORDER — CEFAZOLIN SODIUM-DEXTROSE 1-4 GM/50ML-% IV SOLN
1.0000 g | Freq: Four times a day (QID) | INTRAVENOUS | Status: AC
  Administered 2021-11-01 (×2): 1 g via INTRAVENOUS
  Filled 2021-11-01 (×2): qty 50

## 2021-11-01 MED ORDER — ROPIVACAINE HCL 5 MG/ML IJ SOLN
INTRAMUSCULAR | Status: DC | PRN
  Administered 2021-11-01: 20 mL via PERINEURAL

## 2021-11-01 MED ORDER — BUPIVACAINE-EPINEPHRINE 0.25% -1:200000 IJ SOLN
INTRAMUSCULAR | Status: DC | PRN
  Administered 2021-11-01: 30 mL

## 2021-11-01 MED ORDER — ONDANSETRON HCL 4 MG/2ML IJ SOLN: 4.0000 mg | Freq: Four times a day (QID) | INTRAMUSCULAR | Status: DC | PRN

## 2021-11-01 MED ORDER — FENTANYL CITRATE PF 50 MCG/ML IJ SOSY
PREFILLED_SYRINGE | INTRAMUSCULAR | Status: AC
  Filled 2021-11-01: qty 2

## 2021-11-01 MED ORDER — ADULT MULTIVITAMIN W/MINERALS CH
1.0000 | ORAL_TABLET | Freq: Every day | ORAL | Status: DC
  Administered 2021-11-02: 1 via ORAL
  Filled 2021-11-01: qty 1

## 2021-11-01 MED ORDER — LACTATED RINGERS IV SOLN: INTRAVENOUS | Status: DC

## 2021-11-01 MED ORDER — BUPIVACAINE IN DEXTROSE 0.75-8.25 % IT SOLN
INTRATHECAL | Status: DC | PRN
  Administered 2021-11-01: 1.4 mL via INTRATHECAL

## 2021-11-01 MED ORDER — DEXAMETHASONE SODIUM PHOSPHATE 10 MG/ML IJ SOLN
INTRAMUSCULAR | Status: AC
  Filled 2021-11-01: qty 1

## 2021-11-01 MED ORDER — ACETAMINOPHEN 500 MG PO TABS
1000.0000 mg | ORAL_TABLET | Freq: Once | ORAL | Status: AC
  Administered 2021-11-01: 1000 mg via ORAL
  Filled 2021-11-01: qty 2

## 2021-11-01 MED ORDER — OXYCODONE HCL 5 MG PO TABS
ORAL_TABLET | ORAL | Status: AC
  Filled 2021-11-01: qty 1

## 2021-11-01 MED ORDER — ONDANSETRON HCL 4 MG/2ML IJ SOLN: 4.0000 mg | Freq: Once | INTRAMUSCULAR | Status: DC | PRN

## 2021-11-01 MED ORDER — 0.9 % SODIUM CHLORIDE (POUR BTL) OPTIME
TOPICAL | Status: DC | PRN
  Administered 2021-11-01: 1000 mL

## 2021-11-01 MED ORDER — DOCUSATE SODIUM 100 MG PO CAPS
100.0000 mg | ORAL_CAPSULE | Freq: Two times a day (BID) | ORAL | Status: DC
  Administered 2021-11-01 – 2021-11-02 (×2): 100 mg via ORAL
  Filled 2021-11-01 (×2): qty 1

## 2021-11-01 MED ORDER — PHENOL 1.4 % MT LIQD: 1.0000 | OROMUCOSAL | Status: DC | PRN

## 2021-11-01 SURGICAL SUPPLY — 58 items
BAG COUNTER SPONGE SURGICOUNT (BAG) IMPLANT
BAG ZIPLOCK 12X15 (MISCELLANEOUS) ×1 IMPLANT
BENZOIN TINCTURE PRP APPL 2/3 (GAUZE/BANDAGES/DRESSINGS) IMPLANT
BLADE SAG 18X100X1.27 (BLADE) ×1 IMPLANT
BLADE SURG SZ10 CARB STEEL (BLADE) ×2 IMPLANT
BNDG ELASTIC 6X5.8 VLCR STR LF (GAUZE/BANDAGES/DRESSINGS) ×2 IMPLANT
BOWL SMART MIX CTS (DISPOSABLE) IMPLANT
CEMENT BONE R 1X40 (Cement) IMPLANT
COMP MED POLY AS PERS S6-7 12 (Joint) ×1 IMPLANT
COMPONENT MED PLY PERSS6-7 12 (Joint) IMPLANT
COOLER ICEMAN CLASSIC (MISCELLANEOUS) ×1 IMPLANT
COVER SURGICAL LIGHT HANDLE (MISCELLANEOUS) ×1 IMPLANT
CUFF TOURN SGL QUICK 34 (TOURNIQUET CUFF) ×1
CUFF TRNQT CYL 34X4.125X (TOURNIQUET CUFF) ×1 IMPLANT
DRAPE INCISE IOBAN 66X45 STRL (DRAPES) ×1 IMPLANT
DRAPE U-SHAPE 47X51 STRL (DRAPES) ×1 IMPLANT
DURAPREP 26ML APPLICATOR (WOUND CARE) ×1 IMPLANT
ELECT BLADE TIP CTD 4 INCH (ELECTRODE) ×1 IMPLANT
ELECT REM PT RETURN 15FT ADLT (MISCELLANEOUS) ×1 IMPLANT
GAUZE PAD ABD 8X10 STRL (GAUZE/BANDAGES/DRESSINGS) ×2 IMPLANT
GAUZE SPONGE 4X4 12PLY STRL (GAUZE/BANDAGES/DRESSINGS) ×1 IMPLANT
GAUZE XEROFORM 1X8 LF (GAUZE/BANDAGES/DRESSINGS) IMPLANT
GLOVE BIO SURGEON STRL SZ7.5 (GLOVE) ×1 IMPLANT
GLOVE BIOGEL PI IND STRL 8 (GLOVE) ×2 IMPLANT
GLOVE ECLIPSE 8.0 STRL XLNG CF (GLOVE) ×1 IMPLANT
GOWN STRL REUS W/ TWL XL LVL3 (GOWN DISPOSABLE) ×2 IMPLANT
GOWN STRL REUS W/TWL XL LVL3 (GOWN DISPOSABLE) ×2
HANDPIECE INTERPULSE COAX TIP (DISPOSABLE) ×1
HDLS TROCR DRIL PIN KNEE 75 (PIN) ×1
HOLDER FOLEY CATH W/STRAP (MISCELLANEOUS) IMPLANT
IMMOBILIZER KNEE 20 (SOFTGOODS) ×1
IMMOBILIZER KNEE 20 THIGH 36 (SOFTGOODS) ×1 IMPLANT
KIT TURNOVER KIT A (KITS) IMPLANT
KNEE SYSTEM FEMUR SZ 6 LT (Knees) IMPLANT
NS IRRIG 1000ML POUR BTL (IV SOLUTION) ×1 IMPLANT
PACK TOTAL KNEE CUSTOM (KITS) ×1 IMPLANT
PAD COLD SHLDR WRAP-ON (PAD) ×1 IMPLANT
PADDING CAST COTTON 6X4 STRL (CAST SUPPLIES) ×2 IMPLANT
PIN DRILL HDLS TROCAR 75 4PK (PIN) IMPLANT
PROTECTOR NERVE ULNAR (MISCELLANEOUS) ×1 IMPLANT
SCREW FEMALE HEX FIX 25X2.5 (ORTHOPEDIC DISPOSABLE SUPPLIES) IMPLANT
SET HNDPC FAN SPRY TIP SCT (DISPOSABLE) ×1 IMPLANT
SET PAD KNEE POSITIONER (MISCELLANEOUS) ×1 IMPLANT
SPIKE FLUID TRANSFER (MISCELLANEOUS) IMPLANT
STAPLER VISISTAT 35W (STAPLE) IMPLANT
STEM POLY PAT PLY 32M KNEE (Knees) IMPLANT
STEM TIBIA 5 DEG SZ D L KNEE (Knees) IMPLANT
STRIP CLOSURE SKIN 1/2X4 (GAUZE/BANDAGES/DRESSINGS) IMPLANT
SUT MNCRL AB 4-0 PS2 18 (SUTURE) IMPLANT
SUT VIC AB 0 CT1 27 (SUTURE) ×1
SUT VIC AB 0 CT1 27XBRD ANTBC (SUTURE) ×1 IMPLANT
SUT VIC AB 1 CT1 36 (SUTURE) ×2 IMPLANT
SUT VIC AB 2-0 CT1 27 (SUTURE) ×2
SUT VIC AB 2-0 CT1 TAPERPNT 27 (SUTURE) ×2 IMPLANT
TIBIA STEM 5 DEG SZ D L KNEE (Knees) ×1 IMPLANT
TRAY FOLEY MTR SLVR 14FR STAT (SET/KITS/TRAYS/PACK) IMPLANT
TRAY FOLEY MTR SLVR 16FR STAT (SET/KITS/TRAYS/PACK) IMPLANT
WATER STERILE IRR 1000ML POUR (IV SOLUTION) ×2 IMPLANT

## 2021-11-01 NOTE — TOC Transition Note (Signed)
Transition of Care [facility]) - CM/SW Discharge Note   Patient Details  Name: Toni Medina MRN: [id number] Date of Birth: [birth year]  [facility]) CM/SW Contact:  Lennart Pall, LCSW Phone Number: 11/01/2021, 2:09 PM   Clinical Narrative:     Met with pt and confirming she is to received RW (was going to borrow one from friend but prefers having her own) and bedside commode via [employer] and have alerted [employer] rep of pt's room #.  HHPT prearranged with [facility].  No further TOC needs.  Final next level of care: [place] Barriers to Discharge: No Barriers Identified   Patient Goals and CMS Choice Patient states their goals for this hospitalization and ongoing recovery are:: return home      Discharge Placement                       Discharge Plan and Services                DME Arranged: 3-N-1, [place] rolling DME Agency: [employer]       HH Arranged: PT [place] Agency: [facility]        Social Determinants of Health (SDOH) Interventions     Readmission Risk Interventions     No data to display

## 2021-11-01 NOTE — Transfer of Care (Signed)
Immediate Anesthesia Transfer of Care Note  Patient: Toni Medina  Procedure(s) Performed: LEFT TOTAL KNEE ARTHROPLASTY (Left: Knee)  Patient Location: PACU  Anesthesia Type:Spinal  Level of Consciousness: awake, alert  and oriented  Airway & Oxygen Therapy: Patient Spontanous Breathing and Patient connected to nasal cannula oxygen  Post-op Assessment: Report given to RN and Post -op Vital signs reviewed and stable  Post vital signs: Reviewed and stable  Last Vitals:  Vitals Value Taken Time  BP 149/62 11/01/21 1100  Temp 36.4 C 11/01/21 1100  Pulse 55 11/01/21 1114  Resp 15 11/01/21 1114  SpO2 100 % 11/01/21 1114  Vitals shown include unvalidated device data.  Last Pain:  Vitals:   11/01/21 1100  TempSrc:   PainSc: 0-No pain         Complications: No notable events documented.

## 2021-11-01 NOTE — Anesthesia Procedure Notes (Signed)
Anesthesia Regional Block: Adductor canal block   Pre-Anesthetic Checklist: , timeout performed,  Correct Patient, Correct Site, Correct Laterality,  Correct Procedure, Correct Position, site marked,  Risks and benefits discussed,  Surgical consent,  Pre-op evaluation,  At surgeon's request and post-op pain management  Laterality: Left  Prep: chloraprep       Needles:  Injection technique: Single-shot  Needle Type: Echogenic Needle     Needle Length: 9cm  Needle Gauge: 21     Additional Needles:   Procedures:,,,, ultrasound used (permanent image in chart),,    Narrative:  Start time: 11/01/2021 7:51 AM End time: 11/01/2021 7:57 AM Injection made incrementally with aspirations every 5 mL.  Performed by: Personally  Anesthesiologist: Santa Lighter, MD  Additional Notes: No pain on injection. No increased resistance to injection. Injection made in 5cc increments.  Good needle visualization.  Patient tolerated procedure well.

## 2021-11-01 NOTE — Anesthesia Postprocedure Evaluation (Addendum)
Anesthesia Post Note  Patient: Toni Medina  Procedure(s) Performed: LEFT TOTAL KNEE ARTHROPLASTY (Left: Knee)     Patient location during evaluation: PACU Anesthesia Type: Spinal Level of consciousness: awake, awake and alert and oriented Pain management: pain level controlled Vital Signs Assessment: post-procedure vital signs reviewed and stable Respiratory status: spontaneous breathing, nonlabored ventilation and respiratory function stable Cardiovascular status: blood pressure returned to baseline and stable Postop Assessment: no headache, no backache, spinal receding and no apparent nausea or vomiting Anesthetic complications: no   No notable events documented.  Last Vitals:  Vitals:   11/01/21 1315 11/01/21 1330  BP: (!) 117/99 (!) 151/65  Pulse: 60 (!) 56  Resp: 15 17  Temp: 36.5 C   SpO2: 98% 98%    Last Pain:  Vitals:   11/01/21 1315  TempSrc:   PainSc: [name]

## 2021-11-01 NOTE — Op Note (Signed)
Operative Note  Date of operation: 11/01/2021 Preoperative diagnosis: Left knee osteoarthritis and degenerative joint disease Postoperative diagnosis: Same  Procedure: Left cemented total knee arthroplasty  Implants: Biomet/Zimmer persona knee system with size 6 narrow left CR femur, size D left tibial tray, 12 mm medial congruent left fixed-bearing polythene insert, 32 mm patella button  Surgeon: Lind Guest. Ninfa Linden, MD System: Benita Stabile, PA-C  Anesthesia: #1 left lower extremity regional block, #2 spinal, #3 local EBL: Less than 50 cc Antibiotics: 2 g IV Ancef Complications: None  Indications: The patient is a 88 year old female well-known to me.  She has debilitating arthritis involving both of her knees and her knees are basically windswept with valgus malalignment of the right knee and varus malalignment of the left knee.  Both knees have severe end-stage arthritis that have been well-documented with clinical exam and x-ray findings.  This point with very conservative treatment for well over a year she wished to proceed with a left total knee arthroplasty.  Her left knee pain is daily and it is detrimentally affecting her mobility, her quality of life and her actives daily living.  We did talk about the risk of acute blood loss anemia, nerve vessel injury, fracture, infection, DVT, implant failure and wound healing issues.  We talked about her goals being hopefully decrease pain, improve mobility, and improve  Procedure description: After informed consent was obtained and the appropriate left knee was marked, anesthesia obtained an adductor canal block in the holding room of the left lower extremity.  She was then brought to the operating room and set up on the operating table where spinal anesthesia was obtained.  She was laid in supine position on the operating table and a Foley catheter was placed.  A nonsterile tourniquet is placed around her upper left thigh.  Her left thigh,  knee, leg, ankle and foot were prepped and draped with DuraPrep and sterile drapes.  A timeout was called and she was identified as the correct patient and the correct the left knee.  We then used Esmarch to wrap out the leg and the tourniquet was plated to 300 mm of pressure.  I then made a direct midline incision over the patella and carried this proximally distally.  We dissected the knee joint carried out a medial parapatellar arthrotomy.  With the knee in a flexed position we found significant cartilage wear in all 3 compartments of the knee.  Osteophytes removed from all 3 compartments as well as an effusion and remnants of the medial and lateral meniscus and the ACL.  We then used an extramedullary cutting guide for making her proximal tibia cut correction varus and valgus at 3 degree slope.  We made this cut to take 2 mm off the low side.  We made the cut without difficulty.  We then went to the femur and used the intramedullary guide for the distal femoral cutting block setting this for left knee at 5 degrees externally rotated and a 10 mm distal femoral cut.  Distal femur cut was made without any complications and the knee was brought down to full extension and a 10 mm extension block and given her full extension.  We went back to the femur and put a femoral sizing guide based off the epicondylar axis.  Based off of this we chose a size 6 femur.  We put a 4-in-1 cutting block for a size 6 femur and made her anterior posterior cuts followed our chamfer cuts.  She was turned  back to the tibia.  We chose a size D left tibial tray for coverage over the tibial plateau setting the rotation of the tibial tubercle and the femur.  We made our keel punch and drill hole off of this.  We then trialed our size D left tibial tray followed by our size 6 left femur.  We placed a 10 mm congruent polyethylene insert for her left knee and went up to a 12 mm insert.  We are pleased with the range of motion and stability with  the 12 mm insert.  We then made our patella cut and drilled 3 holes for a size 32 patella button.  Again we put her through several cycles of motion with that knee and we are pleased with range of motion and stability.  All trial components were then removed.  The knee was irrigated with normal saline solution.  The arthrotomy was then injected with a mixture of local Marcaine quarter percent with epinephrine.  We then mixed our cement with the knee in a flexed position cemented our Biomet Zimmer tibial tray for left knee size D followed by cementing our size 6 left CR narrow femur.  We placed our 12 mm thickness medial congruent fixed-bearing polythene insert for left knee and cemented our size 32 patella button.  The knee was held fully extended and compressed while the cement hardened.  Once it hardened we let the tourniquet down and hemostasis was obtained with electrocautery.  We then closed the arthrotomy with interrupted #1 Vicryl suture.  0 Vicryl was used to close the deep tissue and 2-0 Vicryl is used to close subcutaneous tissue.  The skin was closed with staples.  Well-padded sterile dressings applied.  The patient was taken to recovery in stable addition with all final counts being correct and no complications noted.  Of note Benita Stabile, PA-C did assisted in entire case from beginning to end and his assistance was medically necessary for soft tissue management and retraction, helping guide implant placement and a layered closure of the wound.

## 2021-11-01 NOTE — Interval H&P Note (Signed)
History and Physical Interval Note: The patient is here today for a left total knee arthroplasty to treat her severe left knee osteoarthritis.  There has been no acute or interval change in her medical status.  See H&P.  The risks and benefits of surgery been explained in detail and informed consent was obtained.  The left operative knee has been marked.  11/01/2021 6:55 AM  Toni Medina  has presented today for surgery, with the diagnosis of OSTEOARTHRITIS LEFT KNEE.  The various methods of treatment have been discussed with the patient and family. After consideration of risks, benefits and other options for treatment, the patient has consented to  Procedure(s): LEFT TOTAL KNEE ARTHROPLASTY (Left) as a surgical intervention.  The patient's history has been reviewed, patient examined, no change in status, stable for surgery.  I have reviewed the patient's chart and labs.  Questions were answered to the patient's satisfaction.     Mcarthur Rossetti

## 2021-11-01 NOTE — Anesthesia Preprocedure Evaluation (Addendum)
Anesthesia Evaluation  Patient identified by MRN, date of birth, ID band Patient awake    Reviewed: Allergy & Precautions, NPO status , Patient's Chart, lab work & pertinent test results  Airway Mallampati: III  TM Distance: >3 FB Neck ROM: Full    Dental  (+) Dental Advisory Given, Caps, Partial Upper   Pulmonary neg pulmonary ROS,    Pulmonary exam normal breath sounds clear to auscultation       Cardiovascular negative cardio ROS Normal cardiovascular exam Rhythm:Regular Rate:Normal     Neuro/Psych negative neurological ROS     GI/Hepatic negative GI ROS, Neg liver ROS,   Endo/Other  Hypothyroidism   Renal/GU negative Renal ROS     Musculoskeletal  (+) Arthritis ,   Abdominal   Peds  Hematology negative hematology ROS (+) Plt 277k   Anesthesia Other Findings Day of surgery medications reviewed with the patient.  Reproductive/Obstetrics                            Anesthesia Physical Anesthesia Plan  ASA: 3  Anesthesia Plan: Spinal   Post-op Pain Management: Tylenol PO (pre-op)* and Regional block*   Induction: Intravenous  PONV Risk Score and Plan: 2 and TIVA, Treatment may vary due to age or medical condition, Dexamethasone and Ondansetron  Airway Management Planned: Simple Face Mask and Natural Airway  Additional Equipment:   Intra-op Plan:   Post-operative Plan:   Informed Consent: I have reviewed the patients History and Physical, chart, labs and discussed the procedure including the risks, benefits and alternatives for the proposed anesthesia with the patient or authorized representative who has indicated his/her understanding and acceptance.     Dental advisory given  Plan Discussed with: CRNA  Anesthesia Plan Comments:         Anesthesia Quick Evaluation

## 2021-11-01 NOTE — Anesthesia Procedure Notes (Signed)
Spinal  Patient location during procedure: OR Start time: 11/01/2021 8:32 AM End time: 11/01/2021 8:34 AM Reason for block: surgical anesthesia Staffing Performed: resident/CRNA  Resident/CRNA: [place] Sivan Quast C, CRNA Performed by: [place] Endrit Gittins C, CRNA Authorized by: Santa Lighter, MD   Preanesthetic Checklist Completed: patient identified, IV checked, site marked, risks and benefits discussed, surgical consent, monitors and equipment checked, pre-op evaluation and timeout performed Spinal Block Patient position: sitting Prep: DuraPrep and site prepped and draped Patient monitoring: heart rate, cardiac monitor, continuous pulse ox and blood pressure Approach: midline Location: L3-4 Injection technique: single-shot Needle Needle type: Pencan  Needle gauge: 24 G Needle length: 9 cm Assessment Sensory level: T4 Events: CSF return Additional Notes IV functioning, monitors applied to pt. Expiration date of kit checked and confirmed to be in date. Sterile prep and drape, hand hygiene and sterile gloved used. Pt was positioned and spine was prepped in sterile fashion. Skin was anesthetized with lidocaine. Free flow of clear CSF obtained prior to injecting local anesthetic into CSF x 1 attempt. Spinal needle aspirated freely following injection. Needle was carefully withdrawn, and pt tolerated procedure well. Loss of motor and sensory on exam post injection.

## 2021-11-01 NOTE — Evaluation (Signed)
Physical Therapy Evaluation Patient Details Name: Toni Medina MRN: [id number] DOB: [birth year] Today's Date: 11/01/2021  History of Present Illness  Pt is an 88yo female presenting s/p L-TKA on 11/01/21. PMH: hypothyroidism  Clinical Impression  Toni Medina is a 88 y.o. female POD 0 s/p L-TKA. Patient reports modified independence using SPC with mobility at baseline. Patient is now limited by functional impairments (see PT problem list below) and requires supervision for bed mobility and min guard for transfers. Patient was able to ambulate 20 feet with RW and min guard level of assist. Patient instructed in exercise to facilitate ROM and circulation to manage edema. Provided incentive spirometer and with Vcs pt able to achieve 1067m. Patient will benefit from continued skilled PT interventions to address impairments and progress towards PLOF. Acute PT will follow to progress mobility and stair training in preparation for safe discharge home.       Recommendations for follow up therapy are one component of a multi-disciplinary discharge planning process, led by the attending physician.  Recommendations may be updated based on patient status, additional functional criteria and insurance authorization.  Follow Up Recommendations Follow physician's recommendations for discharge plan and follow up therapies      Assistance Recommended at Discharge Intermittent Supervision/Assistance  Patient can return home with the following  A little help with walking and/or transfers;A little help with bathing/dressing/bathroom;Assistance with cooking/housework;Assist for transportation;Help with stairs or ramp for entrance    Equipment Recommendations Rolling walker (2 wheels);BSC/3in1  Recommendations for Other Services       Functional Status Assessment Patient has had a recent decline in their functional status and demonstrates the ability to make significant improvements in function in a  reasonable and predictable amount of time.     Precautions / Restrictions Precautions Precautions: Fall Restrictions Weight Bearing Restrictions: No Other Position/Activity Restrictions: WBAT      Mobility  Bed Mobility Overal bed mobility: Needs Assistance Bed Mobility: Supine to Sit     Supine to sit: Supervision     General bed mobility comments: For safety, no physical assist required    Transfers Overall transfer level: Needs assistance Equipment used: Rolling walker (2 wheels) Transfers: Sit to/from Stand Sit to Stand: Min guard           General transfer comment: For safety only, no physical assist required. VCs for hand placement and powering up through RLE    Ambulation/Gait Ambulation/Gait assistance: Min guard Gait Distance (Feet): 20 Feet Assistive device: Rolling walker (2 wheels) Gait Pattern/deviations: Step-to pattern Gait velocity: decreased     General Gait Details: Pt ambulated with RW and min guard, no overt LOB noted or physical assist required, +2 for recliner follow for safety. Step-to pattern  Stairs            Wheelchair Mobility    Modified Rankin (Stroke Patients Only)       Balance Overall balance assessment: Needs assistance Sitting-balance support: Feet supported, No upper extremity supported Sitting balance-Leahy Scale: Good     Standing balance support: Reliant on assistive device for balance, During functional activity, Bilateral upper extremity supported Standing balance-Leahy Scale: Poor                               Pertinent Vitals/Pain Pain Assessment Pain Assessment: No/denies pain    Home Living Family/patient expects to be discharged to:: Private residence Living Arrangements: Alone Available Help at Discharge: Family;Available 24 hours/day (  Daughter and Son will alternate) Type of Home: House Home Access: Stairs to enter Entrance Stairs-Rails: None (Side entry: 6steps with Rsided  railing) Entrance Stairs-Number of Steps: 2 ([place])   Home Layout: One level Home Equipment: Tub bench;Cane - single point      Prior Function Prior Level of Function : Independent/Modified Independent             Mobility Comments: SPC ADLs Comments: IND     Hand Dominance        Extremity/Trunk Assessment   Upper Extremity Assessment Upper Extremity Assessment: Overall WFL for tasks assessed    Lower Extremity Assessment Lower Extremity Assessment: LLE deficits/detail;RLE deficits/detail RLE Deficits / Details: MMT ank DF/PF 5/5 RLE Sensation: WNL LLE Deficits / Details: MMT ank DF/PF 5/5, no extensor lag noted LLE Sensation: WNL    Cervical / Trunk Assessment Cervical / Trunk Assessment: Kyphotic  Communication   Communication: No difficulties  Cognition Arousal/Alertness: Awake/alert Behavior During Therapy: WFL for tasks assessed/performed Overall Cognitive Status: Within Functional Limits for tasks assessed                                          General Comments General comments (skin integrity, edema, etc.): Daughter Benjamine Mola present    Exercises Total Joint Exercises Ankle Circles/Pumps: AROM, Both, 20 reps   Assessment/Plan    PT Assessment Patient needs continued PT services  PT Problem List Decreased strength;Decreased range of motion;Decreased activity tolerance;Decreased balance;Decreased mobility;Decreased coordination;Pain;Decreased knowledge of use of DME       PT Treatment Interventions DME instruction;Gait training;Stair training;Functional mobility training;Therapeutic activities;Therapeutic exercise;Balance training;Neuromuscular re-education;Patient/family education    PT Goals (Current goals can be found in the Care Plan section)  Acute Rehab PT Goals Patient Stated Goal: Bake cookies PT Goal Formulation: With patient Time For Goal Achievement: 11/08/21 Potential to Achieve Goals: Good    Frequency  7X/week     Co-evaluation               AM-PAC PT "6 Clicks" Mobility  Outcome Measure Help needed turning from your back to your side while in a flat bed without using bedrails?: None Help needed moving from lying on your back to sitting on the side of a flat bed without using bedrails?: A Little Help needed moving to and from a bed to a chair (including a wheelchair)?: A Little Help needed standing up from a chair using your arms (e.g., wheelchair or bedside chair)?: A Little Help needed to walk in hospital room?: A Little Help needed climbing 3-5 steps with a railing? : A Little 6 Click Score: 19    End of Session Equipment Utilized During Treatment: Gait belt Activity Tolerance: Patient tolerated treatment well;No increased pain Patient left: in chair;with call bell/phone within reach;with chair alarm set;with family/visitor present;with SCD's reapplied Nurse Communication: Mobility status PT Visit Diagnosis: Pain;Difficulty in walking, not elsewhere classified (R26.2) Pain - Right/Left: Left Pain - part of body: Knee    Time: 5732-2025 PT Time Calculation (min) (ACUTE ONLY): 27 min   Charges:   PT Evaluation $PT Eval Low Complexity: 1 Low PT Treatments $Gait Training: 8-22 mins        Coolidge Breeze, PT, DPT [facility] Office: [phone] Weekend pager: [phone]  Coolidge Breeze 11/01/2021, 5:49 PM

## 2021-11-02 DIAGNOSIS — E039 Hypothyroidism, unspecified: Secondary | ICD-10-CM | POA: Diagnosis not present

## 2021-11-02 DIAGNOSIS — Z79899 Other long term (current) drug therapy: Secondary | ICD-10-CM | POA: Diagnosis not present

## 2021-11-02 DIAGNOSIS — Z7982 Long term (current) use of aspirin: Secondary | ICD-10-CM | POA: Diagnosis not present

## 2021-11-02 DIAGNOSIS — M1712 Unilateral primary osteoarthritis, left knee: Secondary | ICD-10-CM | POA: Diagnosis not present

## 2021-11-02 LAB — BASIC METABOLIC PANEL
Anion gap: 7 (ref 5–15)
BUN: 19 mg/dL (ref 8–23)
CO2: 24 mmol/L (ref 22–32)
Calcium: 8.3 mg/dL — ABNORMAL LOW (ref 8.9–10.3)
Chloride: 102 mmol/L (ref 98–111)
Creatinine, Ser: 0.58 mg/dL (ref 0.44–1.00)
GFR, Estimated: >60 mL/min (ref >60)
Glucose, Bld: 123 mg/dL — ABNORMAL HIGH (ref 70–99)
Potassium: 3.5 mmol/L (ref 3.5–5.1)
Sodium: 133 mmol/L — ABNORMAL LOW (ref 135–145)

## 2021-11-02 LAB — CBC
HCT: 32 % — ABNORMAL LOW (ref 36.0–46.0)
Hemoglobin: 10.4 g/dL — ABNORMAL LOW (ref 12.0–15.0)
MCH: 30.1 pg (ref 26.0–34.0)
MCHC: 32.5 g/dL (ref 30.0–36.0)
MCV: 92.8 fL (ref 80.0–100.0)
Platelets: 213 10*3/uL (ref 150–400)
RBC: 3.45 MIL/uL — ABNORMAL LOW (ref 3.87–5.11)
RDW: 13 % (ref 11.5–15.5)
WBC: 8 10*3/uL (ref 4.0–10.5)
nRBC: 0 % (ref 0.0–0.2)

## 2021-11-02 MED ORDER — HYDROCODONE-ACETAMINOPHEN 5-325 MG PO TABS: 1.0000 | ORAL_TABLET | Freq: Four times a day (QID) | ORAL | 0 refills | Status: DC | PRN

## 2021-11-02 MED ORDER — ASPIRIN 81 MG PO CHEW: 81.0000 mg | CHEWABLE_TABLET | Freq: Two times a day (BID) | ORAL | 0 refills | Status: AC

## 2021-11-02 MED ORDER — METHOCARBAMOL 500 MG PO TABS: 500.0000 mg | ORAL_TABLET | Freq: Four times a day (QID) | ORAL | 1 refills | Status: DC | PRN

## 2021-11-02 NOTE — Discharge Instructions (Signed)

## 2021-11-02 NOTE — Plan of Care (Signed)
  Problem: Pain Management: Goal: Pain level will decrease with appropriate interventions Outcome: Progressing   Problem: Coping: Goal: Level of anxiety will decrease Outcome: Progressing   

## 2021-11-02 NOTE — Progress Notes (Signed)
Physical Therapy Treatment Patient Details Name: Toni Medina MRN: [id number] DOB: 1931/08/05 Today's Date: 11/02/2021   History of Present Illness Pt is an 88yo female presenting s/p L-TKA on 11/01/21. PMH: hypothyroidism    PT Comments    POD # 1 am session General Comments: AxO x 2 very pleasant Lady with 25% repeat VC's to complete task. General bed mobility comments: demonstarted and instructed how to use belt to self assist LE.  Also Educated Daughter "hands on" how to slowly/safely assist. General transfer comment: Had Daughter "hands on" assist using safety belt with all transfers including toilet transfer. General Gait Details: Had Daughter "hands on" assist with all ambulation including to and from bathroom with instructions on safe handling. Then returned to room to perform some TE's following HEP handout.  Instructed on proper tech, freq as well as use of ICE.   Pt will need another PT session to address stair training.   Recommendations for follow up therapy are one component of a multi-disciplinary discharge planning process, led by the attending physician.  Recommendations may be updated based on patient status, additional functional criteria and insurance authorization.  Follow Up Recommendations  Follow physician's recommendations for discharge plan and follow up therapies     Assistance Recommended at Discharge Intermittent Supervision/Assistance  Patient can return home with the following A little help with walking and/or transfers;A little help with bathing/dressing/bathroom;Assistance with cooking/housework;Assist for transportation;Help with stairs or ramp for entrance   Equipment Recommendations  Rolling walker (2 wheels);BSC/3in1    Recommendations for Other Services       Precautions / Restrictions Precautions Precautions: Fall Precaution Comments: instructed no pillow under flexed knee Restrictions Weight Bearing Restrictions: No LLE Weight Bearing:  Weight bearing as tolerated     Mobility  Bed Mobility Overal bed mobility: Needs Assistance Bed Mobility: Supine to Sit     Supine to sit: Supervision, Min guard     General bed mobility comments: demonstarted and instructed how to use belt to self assist LE.  Also Educated Daughter "hands on" how to slowly/safely assist.    Transfers Overall transfer level: Needs assistance Equipment used: Rolling walker (2 wheels) Transfers: Sit to/from Stand Sit to Stand: Min guard, Min assist           General transfer comment: Had Daughter "hands on" assist using safety belt with all transfers including toilet transfer.    Ambulation/Gait Ambulation/Gait assistance: Supervision, Min guard Gait Distance (Feet): 24 Feet Assistive device: Rolling walker (2 wheels) Gait Pattern/deviations: Step-to pattern, Decreased stance time - left Gait velocity: decreased     General Gait Details: Had Daughter "hands on" assist with all ambulation including to and from bathroom with instructions on safe handling.   Stairs             Wheelchair Mobility    Modified Rankin (Stroke Patients Only)       Balance                                            Cognition Arousal/Alertness: Awake/alert Behavior During Therapy: WFL for tasks assessed/performed Overall Cognitive Status: Within Functional Limits for tasks assessed                                 General Comments: AxO x 2 very pleasant [facility]  with 25% repeat VC's to complete task.        Exercises  Total Knee Replacement TE's following HEP handout 10 reps B LE ankle pumps 05 reps towel squeezes 05 reps knee presses 05 reps heel slides  05 reps SAQ's 05 reps SLR's 05 reps ABD Educated on use of gait belt to assist with TE's Followed by ICE     General Comments        Pertinent Vitals/Pain Pain Assessment Pain Assessment: Faces Faces Pain Scale: Hurts a little bit Pain  Location: L knee Pain Descriptors / Indicators: Aching, Grimacing, Tender, Operative site guarding Pain Intervention(s): Monitored during session, Premedicated before session, Repositioned, Ice applied    Home Living                          Prior Function            PT Goals (current goals can now be found in the care plan section) Progress towards PT goals: Progressing toward goals    Frequency    7X/week      PT Plan Current plan remains appropriate    Co-evaluation              AM-PAC PT "6 Clicks" Mobility   Outcome Measure  Help needed turning from your back to your side while in a flat bed without using bedrails?: A Little Help needed moving from lying on your back to sitting on the side of a flat bed without using bedrails?: A Little Help needed moving to and from a bed to a chair (including a wheelchair)?: A Little Help needed standing up from a chair using your arms (e.g., wheelchair or bedside chair)?: A Little Help needed to walk in hospital room?: A Little Help needed climbing 3-5 steps with a railing? : A Little 6 Click Score: 18    End of Session Equipment Utilized During Treatment: Gait belt Activity Tolerance: Patient tolerated treatment well;No increased pain Patient left: in chair;with call bell/phone within reach;with chair alarm set;with family/visitor present Nurse Communication: Mobility status PT Visit Diagnosis: Pain;Difficulty in walking, not elsewhere classified (R26.2) Pain - Right/Left: Left Pain - part of body: Knee     Time: 2703-5009 PT Time Calculation (min) (ACUTE ONLY): 39 min  Charges:  $Gait Training: 8-22 mins $Therapeutic Exercise: 8-22 mins $Therapeutic Activity: 8-22 mins                     Rica Koyanagi  PTA [place] Office M-F          [phone] Weekend pager [phone]

## 2021-11-02 NOTE — Progress Notes (Signed)
Subjective: 1 Day Post-Op Procedure(s) (LRB): LEFT TOTAL KNEE ARTHROPLASTY (Left) Patient reports pain as moderate.    Objective: Vital signs in last 24 hours: Temp:  [97.5 F (36.4 C)-99 F (37.2 C)] 98.7 F (37.1 C) (09/30 0528) Pulse Rate:  [52-73] 66 (09/30 0528) Resp:  [14-21] 18 (09/30 0528) BP: (111-160)/(42-99) 111/52 (09/30 0528) SpO2:  [87 %-100 %] 97 % (09/30 0528)  Intake/Output from previous day: 09/29 0701 - 09/30 0700 In: 2470 [P.O.:420; I.V.:2000; IV Piggyback:50] Out: 2975 [Urine:2900; Blood:75] Intake/Output this shift: Total I/O In: 240 [P.O.:240] Out: -   Recent Labs    11/02/21 0341  HGB 10.4*   Recent Labs    11/02/21 0341  WBC 8.0  RBC 3.45*  HCT 32.0*  PLT 213   Recent Labs    11/02/21 0341  NA 133*  K 3.5  CL 102  CO2 24  BUN 19  CREATININE 0.58  GLUCOSE 123*  CALCIUM 8.3*   No results for input(s): "LABPT", "INR" in the last 72 hours.  Sensation intact distally Intact pulses distally Dorsiflexion/Plantar flexion intact Compartment soft   Assessment/Plan: 1 Day Post-Op Procedure(s) (LRB): LEFT TOTAL KNEE ARTHROPLASTY (Left) Up with therapy Discharge home with home health this afternoon if doing well.      Mcarthur Rossetti 11/02/2021, 10:41 AM

## 2021-11-02 NOTE — Progress Notes (Signed)
Physical Therapy Treatment Patient Details Name: Toni Medina MRN: [id number] DOB: 09-17-1931 Today's Date: 11/02/2021   History of Present Illness Pt is an 88yo female presenting s/p L-TKA on 11/01/21. PMH: hypothyroidism    PT Comments    POD # 1 pm session Instructed Daughter "hands on" using safety belt to assist pt up/down stairs as well as assisting back to bed with proper positioning.  Daughter familiar with ICE machine and Educated on proper use/freq.  Addressed all mobility questions, discussed appropriate activity, educated on use of ICE.  Pt ready for D/C to home.   Recommendations for follow up therapy are one component of a multi-disciplinary discharge planning process, led by the attending physician.  Recommendations may be updated based on patient status, additional functional criteria and insurance authorization.  Follow Up Recommendations  Follow physician's recommendations for discharge plan and follow up therapies     Assistance Recommended at Discharge Intermittent Supervision/Assistance  Patient can return home with the following A little help with walking and/or transfers;A little help with bathing/dressing/bathroom;Assistance with cooking/housework;Assist for transportation;Help with stairs or ramp for entrance   Equipment Recommendations  Rolling walker (2 wheels);BSC/3in1    Recommendations for Other Services       Precautions / Restrictions Precautions Precautions: Fall Precaution Comments: instructed no pillow under flexed knee Restrictions Weight Bearing Restrictions: No LLE Weight Bearing: Weight bearing as tolerated     Mobility  Bed Mobility Overal bed mobility: Needs Assistance Bed Mobility: Sit to Supine     Sit to supine: Min guard, Min assist   General bed mobility comments: demonstarted and instructed how to use belt to self assist LE.  Also Educated Daughter "hands on" how to slowly/safely assist.    Transfers Overall transfer  level: Needs assistance Equipment used: Rolling walker (2 wheels) Transfers: Sit to/from Stand Sit to Stand: Min guard, Min assist           General transfer comment: Had Daughter "hands on" assist using safety belt with all transfers.    Ambulation/Gait Ambulation/Gait assistance: Supervision, Min guard Gait Distance (Feet): 35 Feet Assistive device: Rolling walker (2 wheels) Gait Pattern/deviations: Step-to pattern, Decreased stance time - left Gait velocity: decreased     General Gait Details: Had Daughter "hands on" assist with all ambulation including to and from bathroom with instructions on safe handling.   Stairs Stairs: Yes Stairs assistance: Min assist Stair Management: No rails, Step to pattern, Forwards Number of Stairs: 4 General stair comments: had Daughter "hands on" asisst pt up/down 2 steps no rail using walker.  Performed twice with one seated rest break.   Wheelchair Mobility    Modified Rankin (Stroke Patients Only)       Balance                                            Cognition Arousal/Alertness: Awake/alert Behavior During Therapy: WFL for tasks assessed/performed Overall Cognitive Status: Within Functional Limits for tasks assessed                                 General Comments: AxO x 2 very pleasant Lady with 25% repeat VC's to complete task.        Exercises      General Comments        Pertinent Vitals/Pain Pain  Assessment Pain Assessment: Faces Faces Pain Scale: Hurts a little bit Pain Location: L knee Pain Descriptors / Indicators: Aching, Grimacing, Tender, Operative site guarding Pain Intervention(s): Monitored during session, Premedicated before session, Repositioned, Ice applied    Home Living                          Prior Function            PT Goals (current goals can now be found in the care plan section) Progress towards PT goals: Progressing toward goals     Frequency    7X/week      PT Plan Current plan remains appropriate    Co-evaluation              AM-PAC PT "6 Clicks" Mobility   Outcome Measure  Help needed turning from your back to your side while in a flat bed without using bedrails?: A Little Help needed moving from lying on your back to sitting on the side of a flat bed without using bedrails?: A Little Help needed moving to and from a bed to a chair (including a wheelchair)?: A Little Help needed standing up from a chair using your arms (e.g., wheelchair or bedside chair)?: A Little Help needed to walk in hospital room?: A Little Help needed climbing 3-5 steps with a railing? : A Little 6 Click Score: 18    End of Session Equipment Utilized During Treatment: Gait belt Activity Tolerance: Patient tolerated treatment well;No increased pain Patient left: in bed Nurse Communication: Mobility status PT Visit Diagnosis: Pain;Difficulty in walking, not elsewhere classified (R26.2) Pain - Right/Left: Left Pain - part of body: Knee     Time: 0630-1601 PT Time Calculation (min) (ACUTE ONLY): 26 min  Charges:  $Gait Training: 8-22 mins $Therapeutic Activity: 8-22 mins                     Rica Koyanagi  PTA [facility] M-F          [phone] Weekend pager [phone]

## 2021-11-02 NOTE — Plan of Care (Signed)
Pt stable at time of d/c instructions and education. No needs at time of d/c instructions. Pt dressing clean, dry, and intact.

## 2021-11-02 NOTE — Plan of Care (Signed)
  Problem: Clinical Measurements: Goal: Postoperative complications will be avoided or minimized Outcome: Progressing   Problem: Pain Management: Goal: Pain level will decrease with appropriate interventions Outcome: Progressing   Problem: Skin Integrity: Goal: Will show signs of wound healing Outcome: Progressing   Problem: Activity: Goal: Risk for activity intolerance will decrease Outcome: Progressing

## 2021-11-03 DIAGNOSIS — Z7982 Long term (current) use of aspirin: Secondary | ICD-10-CM | POA: Diagnosis not present

## 2021-11-03 DIAGNOSIS — Z471 Aftercare following joint replacement surgery: Secondary | ICD-10-CM | POA: Diagnosis not present

## 2021-11-03 DIAGNOSIS — Z791 Long term (current) use of non-steroidal anti-inflammatories (NSAID): Secondary | ICD-10-CM | POA: Diagnosis not present

## 2021-11-03 DIAGNOSIS — Z96652 Presence of left artificial knee joint: Secondary | ICD-10-CM | POA: Diagnosis not present

## 2021-11-03 DIAGNOSIS — Z9181 History of falling: Secondary | ICD-10-CM | POA: Diagnosis not present

## 2021-11-03 DIAGNOSIS — E039 Hypothyroidism, unspecified: Secondary | ICD-10-CM | POA: Diagnosis not present

## 2021-11-03 NOTE — Discharge Summary (Signed)
Patient ID: Toni Medina MRN: [id number] DOB/AGE: May 03, 1931 88 y.o.  Admit date: 11/01/2021 Discharge date: 11/02/2021  Admission Diagnoses:  Principal Problem:   Unilateral primary osteoarthritis, left knee Active Problems:   Status post total left knee replacement   Discharge Diagnoses:  Same  Past Medical History:  Diagnosis Date   Arthritis    Hypothyroidism     Surgeries: Procedure(s): LEFT TOTAL KNEE ARTHROPLASTY on 11/01/2021   Consultants:   Discharged Condition: Improved  Hospital Course: Toni Medina is an 88 y.o. female who was admitted 11/01/2021 for operative treatment ofUnilateral primary osteoarthritis, left knee. Patient has severe unremitting pain that affects sleep, daily activities, and work/hobbies. After pre-op clearance the patient was taken to the operating room on 11/01/2021 and underwent  Procedure(s): LEFT TOTAL KNEE ARTHROPLASTY.    Patient was given perioperative antibiotics:  Anti-infectives (From admission, onward)    Start     Dose/Rate Route Frequency Ordered Stop   11/01/21 1500  ceFAZolin (ANCEF) IVPB 1 g/50 mL premix        1 g 100 mL/hr over 30 Minutes Intravenous Every 6 hours 11/01/21 1408 11/01/21 2211   11/01/21 0630  ceFAZolin (ANCEF) IVPB 2g/100 mL premix        2 g 200 mL/hr over 30 Minutes Intravenous On call to O.R. 11/01/21 5621 11/01/21 0837        Patient was given sequential compression devices, early ambulation, and chemoprophylaxis to prevent DVT.  Patient benefited maximally from hospital stay and there were no complications.    Recent vital signs: Patient Vitals for the past 24 hrs:  BP Temp Temp src Pulse Resp SpO2  11/02/21 1403 (!) 114/48 98.5 F (36.9 C) Oral 75 17 98 %  11/02/21 1046 111/87 98.2 F (36.8 C) Oral 79 17 100 %     Recent laboratory studies:  Recent Labs    11/02/21 0341  WBC 8.0  HGB 10.4*  HCT 32.0*  PLT 213  NA 133*  K 3.5  CL 102  CO2 24  BUN 19  CREATININE 0.58   GLUCOSE 123*  CALCIUM 8.3*     Discharge Medications:   Allergies as of 11/02/2021       Reactions   Doxycycline Rash   Rash on arms        Medication List     TAKE these medications    acetaminophen 500 MG tablet Commonly known as: TYLENOL Take 500-1,000 mg by mouth every 6 (six) hours as needed (pain.).   aspirin 81 MG chewable tablet Chew 1 tablet (81 mg total) by mouth 2 (two) times daily.   aspirin-acetaminophen-caffeine 250-250-65 MG tablet Commonly known as: EXCEDRIN MIGRAINE Take 1-2 tablets by mouth every 6 (six) hours as needed for headache.   HYDROcodone-acetaminophen 5-325 MG tablet Commonly known as: Norco Take 1-2 tablets by mouth every 6 (six) hours as needed for moderate pain.   levothyroxine 75 MCG tablet Commonly known as: SYNTHROID Take 75 mcg by mouth daily before breakfast.   meloxicam 15 MG tablet Commonly known as: MOBIC Take 15 mg by mouth in the morning.   methocarbamol 500 MG tablet Commonly known as: ROBAXIN Take 1 tablet (500 mg total) by mouth every 6 (six) hours as needed for muscle spasms.   multivitamin with minerals Tabs tablet Take 1 tablet by mouth daily.   Vitamin D 125 MCG (5000 UT) Caps Take 5,000 Units by mouth daily.        Diagnostic Studies: DG Knee Left  Port  Result Date: 11/01/2021 CLINICAL DATA:  Left knee arthroplasty EXAM: PORTABLE LEFT KNEE - 1-2 VIEW COMPARISON:  822 FINDINGS: Frontal and cross-table lateral views of the left knee are obtained. 3 component left knee arthroplasty identified in the expected position without signs of acute complication. Postsurgical changes are seen in the overlying soft tissues. IMPRESSION: 1. Unremarkable left knee arthroplasty. Electronically Signed   By: Randa Ngo M.D.   On: 11/01/2021 18:42    Disposition: Discharge disposition: 01-Home or [place], [place] Follow up.   Specialty: [place] Why: to provide home physical therapy visits Contact information: [address] [place] [address] [phone]         Mcarthur Rossetti, MD Follow up in 2 week(s).   Specialty: Orthopedic Surgery Contact information: [address] [name] [address] [phone]                  Signed: Mcarthur Rossetti 11/03/2021, 9:57 AM

## 2021-11-04 ENCOUNTER — Telehealth: Payer: Self-pay | Admitting: *Deleted

## 2021-11-04 ENCOUNTER — Encounter (HOSPITAL_COMMUNITY): Payer: Self-pay | Admitting: Orthopaedic Surgery

## 2021-11-04 NOTE — Telephone Encounter (Signed)
Ortho bundle D/C call completed. 

## 2021-11-06 DIAGNOSIS — Z9181 History of falling: Secondary | ICD-10-CM | POA: Diagnosis not present

## 2021-11-06 DIAGNOSIS — Z471 Aftercare following joint replacement surgery: Secondary | ICD-10-CM | POA: Diagnosis not present

## 2021-11-06 DIAGNOSIS — Z7982 Long term (current) use of aspirin: Secondary | ICD-10-CM | POA: Diagnosis not present

## 2021-11-06 DIAGNOSIS — Z791 Long term (current) use of non-steroidal anti-inflammatories (NSAID): Secondary | ICD-10-CM | POA: Diagnosis not present

## 2021-11-06 DIAGNOSIS — E039 Hypothyroidism, unspecified: Secondary | ICD-10-CM | POA: Diagnosis not present

## 2021-11-06 DIAGNOSIS — Z96652 Presence of left artificial knee joint: Secondary | ICD-10-CM | POA: Diagnosis not present

## 2021-11-07 ENCOUNTER — Other Ambulatory Visit: Payer: Self-pay | Admitting: *Deleted

## 2021-11-07 ENCOUNTER — Encounter: Payer: Medicare Other | Admitting: Orthopaedic Surgery

## 2021-11-07 ENCOUNTER — Telehealth: Payer: Self-pay | Admitting: *Deleted

## 2021-11-07 DIAGNOSIS — M1712 Unilateral primary osteoarthritis, left knee: Secondary | ICD-10-CM

## 2021-11-07 DIAGNOSIS — Z96652 Presence of left artificial knee joint: Secondary | ICD-10-CM

## 2021-11-07 NOTE — Telephone Encounter (Signed)
Ortho bundle call completed. 

## 2021-11-08 DIAGNOSIS — Z96652 Presence of left artificial knee joint: Secondary | ICD-10-CM | POA: Diagnosis not present

## 2021-11-08 DIAGNOSIS — Z791 Long term (current) use of non-steroidal anti-inflammatories (NSAID): Secondary | ICD-10-CM | POA: Diagnosis not present

## 2021-11-08 DIAGNOSIS — Z7982 Long term (current) use of aspirin: Secondary | ICD-10-CM | POA: Diagnosis not present

## 2021-11-08 DIAGNOSIS — E039 Hypothyroidism, unspecified: Secondary | ICD-10-CM | POA: Diagnosis not present

## 2021-11-08 DIAGNOSIS — Z471 Aftercare following joint replacement surgery: Secondary | ICD-10-CM | POA: Diagnosis not present

## 2021-11-08 DIAGNOSIS — Z9181 History of falling: Secondary | ICD-10-CM | POA: Diagnosis not present

## 2021-11-09 DIAGNOSIS — Z791 Long term (current) use of non-steroidal anti-inflammatories (NSAID): Secondary | ICD-10-CM | POA: Diagnosis not present

## 2021-11-09 DIAGNOSIS — Z7982 Long term (current) use of aspirin: Secondary | ICD-10-CM | POA: Diagnosis not present

## 2021-11-09 DIAGNOSIS — Z96652 Presence of left artificial knee joint: Secondary | ICD-10-CM | POA: Diagnosis not present

## 2021-11-09 DIAGNOSIS — E039 Hypothyroidism, unspecified: Secondary | ICD-10-CM | POA: Diagnosis not present

## 2021-11-09 DIAGNOSIS — Z471 Aftercare following joint replacement surgery: Secondary | ICD-10-CM | POA: Diagnosis not present

## 2021-11-09 DIAGNOSIS — Z9181 History of falling: Secondary | ICD-10-CM | POA: Diagnosis not present

## 2021-11-11 DIAGNOSIS — Z9181 History of falling: Secondary | ICD-10-CM | POA: Diagnosis not present

## 2021-11-11 DIAGNOSIS — Z7982 Long term (current) use of aspirin: Secondary | ICD-10-CM | POA: Diagnosis not present

## 2021-11-11 DIAGNOSIS — Z471 Aftercare following joint replacement surgery: Secondary | ICD-10-CM | POA: Diagnosis not present

## 2021-11-11 DIAGNOSIS — Z96652 Presence of left artificial knee joint: Secondary | ICD-10-CM | POA: Diagnosis not present

## 2021-11-11 DIAGNOSIS — Z791 Long term (current) use of non-steroidal anti-inflammatories (NSAID): Secondary | ICD-10-CM | POA: Diagnosis not present

## 2021-11-11 DIAGNOSIS — E039 Hypothyroidism, unspecified: Secondary | ICD-10-CM | POA: Diagnosis not present

## 2021-11-13 DIAGNOSIS — Z791 Long term (current) use of non-steroidal anti-inflammatories (NSAID): Secondary | ICD-10-CM | POA: Diagnosis not present

## 2021-11-13 DIAGNOSIS — Z471 Aftercare following joint replacement surgery: Secondary | ICD-10-CM | POA: Diagnosis not present

## 2021-11-13 DIAGNOSIS — Z96652 Presence of left artificial knee joint: Secondary | ICD-10-CM | POA: Diagnosis not present

## 2021-11-13 DIAGNOSIS — Z9181 History of falling: Secondary | ICD-10-CM | POA: Diagnosis not present

## 2021-11-13 DIAGNOSIS — E039 Hypothyroidism, unspecified: Secondary | ICD-10-CM | POA: Diagnosis not present

## 2021-11-13 DIAGNOSIS — Z7982 Long term (current) use of aspirin: Secondary | ICD-10-CM | POA: Diagnosis not present

## 2021-11-14 ENCOUNTER — Encounter: Payer: Self-pay | Admitting: Physical Therapy

## 2021-11-14 ENCOUNTER — Telehealth: Payer: Self-pay | Admitting: *Deleted

## 2021-11-14 ENCOUNTER — Ambulatory Visit (INDEPENDENT_AMBULATORY_CARE_PROVIDER_SITE_OTHER): Payer: Medicare Other | Admitting: Physical Therapy

## 2021-11-14 ENCOUNTER — Other Ambulatory Visit: Payer: Self-pay

## 2021-11-14 ENCOUNTER — Ambulatory Visit (INDEPENDENT_AMBULATORY_CARE_PROVIDER_SITE_OTHER): Payer: Medicare Other | Admitting: Orthopaedic Surgery

## 2021-11-14 ENCOUNTER — Encounter: Payer: Self-pay | Admitting: Orthopaedic Surgery

## 2021-11-14 DIAGNOSIS — G8929 Other chronic pain: Secondary | ICD-10-CM | POA: Diagnosis not present

## 2021-11-14 DIAGNOSIS — Z96652 Presence of left artificial knee joint: Secondary | ICD-10-CM

## 2021-11-14 DIAGNOSIS — R2681 Unsteadiness on feet: Secondary | ICD-10-CM | POA: Diagnosis not present

## 2021-11-14 DIAGNOSIS — M25562 Pain in left knee: Secondary | ICD-10-CM

## 2021-11-14 DIAGNOSIS — M6281 Muscle weakness (generalized): Secondary | ICD-10-CM | POA: Diagnosis not present

## 2021-11-14 DIAGNOSIS — R2689 Other abnormalities of gait and mobility: Secondary | ICD-10-CM

## 2021-11-14 DIAGNOSIS — M25662 Stiffness of left knee, not elsewhere classified: Secondary | ICD-10-CM | POA: Diagnosis not present

## 2021-11-14 DIAGNOSIS — R600 Localized edema: Secondary | ICD-10-CM

## 2021-11-14 MED ORDER — HYDROCODONE-ACETAMINOPHEN 5-325 MG PO TABS: 1.0000 | ORAL_TABLET | Freq: Four times a day (QID) | ORAL | 0 refills | Status: AC | PRN

## 2021-11-14 MED ORDER — METHOCARBAMOL 500 MG PO TABS: 500.0000 mg | ORAL_TABLET | Freq: Four times a day (QID) | ORAL | 1 refills | Status: AC | PRN

## 2021-11-14 NOTE — Progress Notes (Signed)
The patient is an 88 year old female who is here today for first postoperative visit status post a left total knee arthroplasty.  She has been participating in home therapy.  She starts outpatient physical therapy this afternoon.  Her daughter is with her today as well.  She is taking some hydrocodone during the day and Robaxin at night.  On exam her extension is getting close to full and I can flex her to about 90 degrees.  The staples were removed and Steri-Strips applied.  Calf is soft and there is minimal foot and ankle swelling.  She has been wearing compressive hose.  She will transition outpatient therapy.  I will send in some more pain medicine and muscle relaxant.  We will see her back in 4 weeks to see how she is doing overall.  Physical therapy will help with her making decision about when she is comfortable to drive using her clutch.  She prefers to drive a manual vehicle.  She understands from my standpoint I need her off of narcotics before getting behind the wheel.

## 2021-11-14 NOTE — Therapy (Signed)
OUTPATIENT PHYSICAL THERAPY LOWER EXTREMITY EVALUATION   Patient Name: Toni Medina MRN: [id number] DOB:[birth year], 88 y.o., female Today's Date: 11/14/2021   PT End of Session - 11/14/21 1559     Visit Number 1    Number of Visits 16    Date for PT Re-Evaluation 01/09/22    Authorization Type Medicare/BCBS    Progress Note Due on Visit 10    PT Start Time 1512    PT Stop Time 1552    PT Time Calculation (min) 40 min    Activity Tolerance Patient tolerated treatment well;No increased pain    Behavior During Therapy WFL for tasks assessed/performed             Past Medical History:  Diagnosis Date   Arthritis    Hypothyroidism    Past Surgical History:  Procedure Laterality Date   BREAST EXCISIONAL BIOPSY Left 2014   COLONOSCOPY     MENISCUS REPAIR     TOTAL KNEE ARTHROPLASTY Left 11/01/2021   Procedure: LEFT TOTAL KNEE ARTHROPLASTY;  Surgeon: Mcarthur Rossetti, MD;  Location: [facility];  Service: Orthopedics;  Laterality: Left;   Patient Active Problem List   Diagnosis Date Noted   Status post total left knee replacement 11/01/2021   Unilateral primary osteoarthritis, left knee 09/10/2020    PCP: Seward Carol, MD  REFERRING PROVIDER: Mcarthur Rossetti, MD   REFERRING DIAG: [phone] (ICD-10-CM) - Unilateral primary osteoarthritis, left knee [phone] (ICD-10-CM) - Status post total left knee replacement   THERAPY DIAG:  Chronic pain of left knee - Plan: PT plan of care cert/re-cert  Unsteadiness on feet - Plan: PT plan of care cert/re-cert  Muscle weakness (generalized) - Plan: PT plan of care cert/re-cert  Other abnormalities of gait and mobility - Plan: PT plan of care cert/re-cert  Localized edema - Plan: PT plan of care cert/re-cert  Stiffness of left knee, not elsewhere classified - Plan: PT plan of care cert/re-cert  Rationale for Evaluation and Treatment Rehabilitation  ONSET DATE: 11/01/21   SUBJECTIVE:   SUBJECTIVE  STATEMENT: Pt is s/p Lt TKA on 11/01/21 and she had 6 visits of HHPT.  She is amb with SPC today.  PERTINENT HISTORY: hypothyroidism  PAIN:  Are you having pain? Yes: NPRS scale: 3 currently, up to 5, at best 0-1/10 Pain location: Lt knee Pain description: aching Aggravating factors: exercises/bending knee, movement, static positioning for too long Relieving factors: meds, rest, ice  PRECAUTIONS: None  WEIGHT BEARING RESTRICTIONS No  FALLS:  Has patient fallen in last 6 months? No  LIVING ENVIRONMENT: Lives with: lives alone and daughter/son providing 24 hour care for the next few weeks Lives in: House/apartment Stairs: Yes: External: 2 steps; no railings, holds onto door jam; has 8 steps with handrail as well Has following equipment at home: Single point cane, Walker - 2 wheeled, Manufacturing engineer  OCCUPATION: retired: Personnel officer  PLOF: Independent and Leisure: cooking and baking, reading, no regular exercise  PATIENT GOALS regain regular function, walk without a cane   OBJECTIVE:   PATIENT SURVEYS:  11/14/21: FOTO 38 (predicted 52)  COGNITION: Overall cognitive status: Within functional limits for tasks assessed     SENSATION: WFL  EDEMA:  Circumferential: Joint Line: Rt: 37 cm /Lt: 39.5 cm   POSTURE: rounded shoulders, forward head, and flexed trunk   LOWER EXTREMITY ROM:  Active ROM Right eval Left eval  Knee flexion 121 85  Knee extension -2 (seated LAQ) -18 (seated LAQ)   (  Blank rows = not tested)  Passive ROM Right eval Left eval  Knee flexion  92  Knee extension  -1   (Blank rows = not tested)  LOWER EXTREMITY MMT:  MMT Right eval Left eval  Knee flexion  3-/5  Knee extension  3-/5   (Blank rows = not tested)   GAIT: 11/14/21: Distance walked: 100' in clinic Assistive device utilized: Single point cane Level of assistance: Modified independence Comments: decreased stance on Lt, decreased hip/knee flexion on Lt, flexed  trunk    TODAY'S TREATMENT: 11/14/21 See HEP - performed trial reps with mod cues needed for comprehension   PATIENT EDUCATION:  Education details: HEP Person educated: Patient and Child(ren) Education method: Explanation, Demonstration, and Handouts Education comprehension: verbalized understanding, returned demonstration, and needs further education   HOME EXERCISE PROGRAM: Access Code: [email or web address] URL: [email or web address] Date: 11/14/2021 Prepared by: Faustino Congress  Exercises - Long Sitting Quad Set  - 5-10 x daily - 7 x weekly - 1 sets - 5-10 reps - 5 sec hold - Supine Heel Slide with Strap  - 5-10 x daily - 7 x weekly - 1 sets - 5-10 reps - Seated Knee Extension AROM  - 3-5 x daily - 7 x weekly - 1 sets - 10 reps - 3-5 sec hold - Seated Knee Flexion AAROM  - 3-5 x daily - 7 x weekly - 1 sets - 10 reps - 5-10 sec hold - Supine Knee Extension Mobilization with Weight  - 2-3 x daily - 7 x weekly - 1 sets - 1 reps - 3-5 min hold  ASSESSMENT:  CLINICAL IMPRESSION: Patient is a 88 y.o. female who was seen today for physical therapy evaluation and treatment for Lt TKA on 11/01/21.  She demonstrates decreased strength, ROM and balance as well as gait abnormalities and expected post op pain and edema affecting functional mobility.   She will benefit from PT to address deficits listed.    OBJECTIVE IMPAIRMENTS Abnormal gait, decreased activity tolerance, decreased balance, decreased mobility, difficulty walking, decreased ROM, decreased strength, increased edema, increased fascial restrictions, impaired flexibility, postural dysfunction, and pain.   ACTIVITY LIMITATIONS carrying, lifting, bending, sitting, standing, squatting, sleeping, stairs, transfers, bed mobility, and locomotion level  PARTICIPATION LIMITATIONS: meal prep, cleaning, laundry, driving, shopping, community activity, and occupation  PERSONAL FACTORS 1 comorbidity: hypothyroidism  are also  affecting patient's functional outcome.   REHAB POTENTIAL: Good  CLINICAL DECISION MAKING: Stable/uncomplicated  EVALUATION COMPLEXITY: Low   GOALS: Goals reviewed with patient? Yes  SHORT TERM GOALS: Target date: 12/12/2021  Independent with initial HEP Goal status: INITIAL  2.  Lt knee AROM 0-10-100 for improved function. Goal status: INITIAL  LONG TERM GOALS: Target date: 01/09/2022  Independent with final HEP Goal status: INITIAL  2.  FOTO score improved to 52 Goal status: INITIAL  3.  Lt knee AROM improved to 0-110 for improved mobility and function Goal status: INIITAL  4.  Report pain < 2/10 with standing/walking activities for improved function Goal status: INITIAL  5.  Amb without AD without significant deviations for improved function. Goal status: INITIAL    PLAN: PT FREQUENCY: 2x/week  PT DURATION: 8 weeks  PLANNED INTERVENTIONS: Therapeutic exercises, Therapeutic activity, Neuromuscular re-education, Balance training, Gait training, Patient/Family education, Self Care, Joint mobilization, Stair training, DME instructions, Aquatic Therapy, Dry Needling, Electrical stimulation, Cryotherapy, Moist heat, scar mobilization, Taping, Vasopneumatic device, Ultrasound, Ionotophoresis '4mg'$ /ml Dexamethasone, Manual therapy, and Re-evaluation  PLAN FOR NEXT SESSION: review HEP, work on AROM/PROM  as able (flexion focus), quad strengthening, balance and gait without device when able.  Try stairs as well.   Laureen Abrahams, PT, DPT 11/14/21 4:01 PM

## 2021-11-14 NOTE — Telephone Encounter (Signed)
Ortho bundle 14 day post op meeting completed.

## 2021-11-18 ENCOUNTER — Encounter: Payer: Medicare Other | Admitting: Physical Therapy

## 2021-11-18 ENCOUNTER — Encounter: Payer: Self-pay | Admitting: Rehabilitative and Restorative Service Providers"

## 2021-11-18 ENCOUNTER — Ambulatory Visit (INDEPENDENT_AMBULATORY_CARE_PROVIDER_SITE_OTHER): Payer: Medicare Other | Admitting: Rehabilitative and Restorative Service Providers"

## 2021-11-18 DIAGNOSIS — R2689 Other abnormalities of gait and mobility: Secondary | ICD-10-CM

## 2021-11-18 DIAGNOSIS — G8929 Other chronic pain: Secondary | ICD-10-CM | POA: Diagnosis not present

## 2021-11-18 DIAGNOSIS — M25562 Pain in left knee: Secondary | ICD-10-CM

## 2021-11-18 DIAGNOSIS — M6281 Muscle weakness (generalized): Secondary | ICD-10-CM | POA: Diagnosis not present

## 2021-11-18 DIAGNOSIS — R600 Localized edema: Secondary | ICD-10-CM | POA: Diagnosis not present

## 2021-11-18 DIAGNOSIS — R2681 Unsteadiness on feet: Secondary | ICD-10-CM | POA: Diagnosis not present

## 2021-11-18 DIAGNOSIS — M25662 Stiffness of left knee, not elsewhere classified: Secondary | ICD-10-CM | POA: Diagnosis not present

## 2021-11-18 NOTE — Therapy (Signed)
OUTPATIENT PHYSICAL THERAPY TREATMENT    Patient Name: Toni Medina MRN: [id number] DOB:14-Jun-1931, 88 y.o., female Today's Date: 11/18/2021  PCP: Seward Carol, MD  REFERRING PROVIDER: Mcarthur Rossetti, MD   END OF SESSION:    PT End of Session - 11/18/21 1601     Visit Number 2    Number of Visits 16    Date for PT Re-Evaluation 01/09/22    Authorization Type Medicare/BCBS    Progress Note Due on Visit 10    PT Start Time 6606    PT Stop Time 3016    PT Time Calculation (min) 40 min    Activity Tolerance Patient tolerated treatment well;No increased pain    Behavior During Therapy WFL for tasks assessed/performed              Past Medical History:  Diagnosis Date   Arthritis    Hypothyroidism    Past Surgical History:  Procedure Laterality Date   BREAST EXCISIONAL BIOPSY Left 2014   COLONOSCOPY     MENISCUS REPAIR     TOTAL KNEE ARTHROPLASTY Left 11/01/2021   Procedure: LEFT TOTAL KNEE ARTHROPLASTY;  Surgeon: Mcarthur Rossetti, MD;  Location: [facility];  Service: Orthopedics;  Laterality: Left;   Patient Active Problem List   Diagnosis Date Noted   Status post total left knee replacement 11/01/2021   Unilateral primary osteoarthritis, left knee 09/10/2020    REFERRING DIAG: M17.12 (ICD-10-CM) - Unilateral primary osteoarthritis, left knee Z96.652 (ICD-10-CM) - Status post total left knee replacement   THERAPY DIAG:  Chronic pain of left knee  Unsteadiness on feet  Muscle weakness (generalized)  Other abnormalities of gait and mobility  Localized edema  Stiffness of left knee, not elsewhere classified  Rationale for Evaluation and Treatment Rehabilitation  ONSET DATE: 11/01/21   SUBJECTIVE:   SUBJECTIVE STATEMENT: Pt indicated having a little pain on knee upon arrival today.    PERTINENT HISTORY: hypothyroidism  PAIN:  NPRS scale: 5/10 at worst in last day Pain location: Lt knee Pain description: aching Aggravating  factors: exercises/bending knee, movement, static positioning for too long Relieving factors: meds, rest, ice  PRECAUTIONS: None  WEIGHT BEARING RESTRICTIONS No  FALLS:  Has patient fallen in last 6 months? No  LIVING ENVIRONMENT: Lives with: lives alone and daughter/son providing 24 hour care for the next few weeks Lives in: House/apartment Stairs: Yes: External: 2 steps; no railings, holds onto door jam; has 8 steps with handrail as well Has following equipment at home: Single point cane, Walker - 2 wheeled, Manufacturing engineer  OCCUPATION: retired: Personnel officer  PLOF: Independent and Leisure: cooking and baking, reading, no regular exercise  PATIENT GOALS regain regular function, walk without a cane   OBJECTIVE:   PATIENT SURVEYS:  11/14/21: FOTO 38 (predicted 52)  COGNITION: 11/14/21 Overall cognitive status: Within functional limits for tasks assessed     SENSATION: 11/14/21 [facility]  EDEMA:  11/14/21 Circumferential: Joint Line: Rt: 37 cm /Lt: 39.5 cm   POSTURE:  11/14/21 rounded shoulders, forward head, and flexed trunk   LOWER EXTREMITY ROM:  Active ROM Right Eval 11/14/21 Left Eval 11/14/21  Knee flexion 121 85  Knee extension -2 (seated LAQ) -18 (seated LAQ)   (Blank rows = not tested)  Passive ROM Right Eval 11/14/21 Left Eval 11/14/21  Knee flexion  92  Knee extension  -1   (Blank rows = not tested)  LOWER EXTREMITY MMT:  MMT Right Eval 11/14/21 Left Eval 11/14/21  Knee  flexion  3-/5  Knee extension  3-/5   (Blank rows = not tested)   GAIT: 11/14/21: Distance walked: 100' in clinic Assistive device utilized: Single point cane Level of assistance: Modified independence Comments: decreased stance on Lt, decreased hip/knee flexion on Lt, flexed trunk    TODAY'S TREATMENT: 11/18/2021: Therex: Nustep lvl 5 6 mins UE/LE Leg press double leg 50 lbs x 10, Lt leg only 25 lbs x 11 Seated quad set Lt 5 sec hold x 10  Seated SLR 3 x 5  c quad set focus Seated LAQ 2 x 10   Review verbally of all other existing HEP.   Neuro re-ed Feet together stance 30 sec Modified tandem stance 30 sec x 2 bilateral  11/14/21 See HEP - performed trial reps with mod cues needed for comprehension   PATIENT EDUCATION:  11/14/21 Education details: HEP Person educated: Patient and Child(ren) Education method: Explanation, Demonstration, and Handouts Education comprehension: verbalized understanding, returned demonstration, and needs further education   HOME EXERCISE PROGRAM: Access Code: [email or web address] URL: [email or web address] Date: 11/14/2021 Prepared by: Faustino Congress  Exercises - Long Sitting Quad Set  - 5-10 x daily - 7 x weekly - 1 sets - 5-10 reps - 5 sec hold - Supine Heel Slide with Strap  - 5-10 x daily - 7 x weekly - 1 sets - 5-10 reps - Seated Knee Extension AROM  - 3-5 x daily - 7 x weekly - 1 sets - 10 reps - 3-5 sec hold - Seated Knee Flexion AAROM  - 3-5 x daily - 7 x weekly - 1 sets - 10 reps - 5-10 sec hold - Supine Knee Extension Mobilization with Weight  - 2-3 x daily - 7 x weekly - 1 sets - 1 reps - 3-5 min hold  ASSESSMENT:  CLINICAL IMPRESSION: Good overall recall of HEP upon review.  Able to introduce new strengthening and balance with good overall tolerance.  Continued skilled PT services indicated at this time to progress towards goals.   OBJECTIVE IMPAIRMENTS Abnormal gait, decreased activity tolerance, decreased balance, decreased mobility, difficulty walking, decreased ROM, decreased strength, increased edema, increased fascial restrictions, impaired flexibility, postural dysfunction, and pain.   ACTIVITY LIMITATIONS carrying, lifting, bending, sitting, standing, squatting, sleeping, stairs, transfers, bed mobility, and locomotion level  PARTICIPATION LIMITATIONS: meal prep, cleaning, laundry, driving, shopping, community activity, and occupation  PERSONAL FACTORS 1 comorbidity:  hypothyroidism  are also affecting patient's functional outcome.   REHAB POTENTIAL: Good  CLINICAL DECISION MAKING: Stable/uncomplicated  EVALUATION COMPLEXITY: Low   GOALS: Goals reviewed with patient? Yes  SHORT TERM GOALS: Target date: 12/12/2021  Independent with initial HEP Goal status:on going - assessed 11/18/2021  2.  Lt knee AROM 0-10-100 for improved function. Goal status:on going - assessed 11/18/2021  LONG TERM GOALS: Target date: 01/09/2022  Independent with final HEP Goal status: INITIAL  2.  FOTO score improved to 52 Goal status: INITIAL  3.  Lt knee AROM improved to 0-110 for improved mobility and function Goal status: INIITAL  4.  Report pain < 2/10 with standing/walking activities for improved function Goal status: INITIAL  5.  Amb without AD without significant deviations for improved function. Goal status: INITIAL    PLAN: PT FREQUENCY: 2x/week  PT DURATION: 8 weeks  PLANNED INTERVENTIONS: Therapeutic exercises, Therapeutic activity, Neuromuscular re-education, Balance training, Gait training, Patient/Family education, Self Care, Joint mobilization, Stair training, DME instructions, Aquatic Therapy, Dry Needling, Electrical stimulation, Cryotherapy, Moist heat, scar mobilization, Taping, Vasopneumatic device, Ultrasound, Ionotophoresis  $'4mg'm$ /ml Dexamethasone, Manual therapy, and Re-evaluation  PLAN FOR NEXT SESSION:  Progressive end range mobility gains and strengthening.    Scot Jun, PT, DPT, OCS, ATC 11/18/21  4:39 PM

## 2021-11-20 ENCOUNTER — Ambulatory Visit (INDEPENDENT_AMBULATORY_CARE_PROVIDER_SITE_OTHER): Payer: Medicare Other | Admitting: Physical Therapy

## 2021-11-20 ENCOUNTER — Encounter: Payer: Medicare Other | Admitting: Rehabilitative and Restorative Service Providers"

## 2021-11-20 ENCOUNTER — Encounter: Payer: Self-pay | Admitting: Physical Therapy

## 2021-11-20 DIAGNOSIS — R600 Localized edema: Secondary | ICD-10-CM

## 2021-11-20 DIAGNOSIS — R2681 Unsteadiness on feet: Secondary | ICD-10-CM

## 2021-11-20 DIAGNOSIS — M25562 Pain in left knee: Secondary | ICD-10-CM | POA: Diagnosis not present

## 2021-11-20 DIAGNOSIS — G8929 Other chronic pain: Secondary | ICD-10-CM | POA: Diagnosis not present

## 2021-11-20 DIAGNOSIS — M25662 Stiffness of left knee, not elsewhere classified: Secondary | ICD-10-CM | POA: Diagnosis not present

## 2021-11-20 DIAGNOSIS — R2689 Other abnormalities of gait and mobility: Secondary | ICD-10-CM

## 2021-11-20 NOTE — Therapy (Signed)
OUTPATIENT PHYSICAL THERAPY TREATMENT    Patient Name: Toni Medina MRN: [id number] DOB:06-01-1931, 88 y.o., female Today's Date: 11/20/2021  PCP: Seward Carol, MD  REFERRING PROVIDER: Mcarthur Rossetti, MD   END OF SESSION:    PT End of Session - 11/20/21 1537     Visit Number 3    Number of Visits 16    Date for PT Re-Evaluation 01/09/22    Authorization Type Medicare/BCBS    Progress Note Due on Visit 10    PT Start Time 2119    PT Stop Time 1555    PT Time Calculation (min) 40 min    Activity Tolerance Patient tolerated treatment well;No increased pain    Behavior During Therapy WFL for tasks assessed/performed              Past Medical History:  Diagnosis Date   Arthritis    Hypothyroidism    Past Surgical History:  Procedure Laterality Date   BREAST EXCISIONAL BIOPSY Left 2014   COLONOSCOPY     MENISCUS REPAIR     TOTAL KNEE ARTHROPLASTY Left 11/01/2021   Procedure: LEFT TOTAL KNEE ARTHROPLASTY;  Surgeon: Mcarthur Rossetti, MD;  Location: [facility];  Service: Orthopedics;  Laterality: Left;   Patient Active Problem List   Diagnosis Date Noted   Status post total left knee replacement 11/01/2021   Unilateral primary osteoarthritis, left knee 09/10/2020    REFERRING DIAG: M17.12 (ICD-10-CM) - Unilateral primary osteoarthritis, left knee Z96.652 (ICD-10-CM) - Status post total left knee replacement   THERAPY DIAG:  Chronic pain of left knee  Unsteadiness on feet  Other abnormalities of gait and mobility  Localized edema  Stiffness of left knee, not elsewhere classified  Rationale for Evaluation and Treatment Rehabilitation  ONSET DATE: 11/01/21   SUBJECTIVE:   SUBJECTIVE STATEMENT: Pt indicated not much knee pain, has some pain in her inner thigh area that is sore to touch but does not hurt when hamstrings or adductors or engaged or stretched.   PERTINENT HISTORY: hypothyroidism  PAIN:  NPRS scale: 5/10 at worst in last  day Pain location: Lt knee Pain description: aching Aggravating factors: exercises/bending knee, movement, static positioning for too long Relieving factors: meds, rest, ice  PRECAUTIONS: None  WEIGHT BEARING RESTRICTIONS No  FALLS:  Has patient fallen in last 6 months? No  LIVING ENVIRONMENT: Lives with: lives alone and daughter/son providing 24 hour care for the next few weeks Lives in: House/apartment Stairs: Yes: External: 2 steps; no railings, holds onto door jam; has 8 steps with handrail as well Has following equipment at home: Single point cane, Walker - 2 wheeled, Manufacturing engineer  OCCUPATION: retired: Personnel officer  PLOF: Independent and Leisure: cooking and baking, reading, no regular exercise  PATIENT GOALS regain regular function, walk without a cane   OBJECTIVE:   PATIENT SURVEYS:  11/14/21: FOTO 38 (predicted 52)  COGNITION: 11/14/21 Overall cognitive status: Within functional limits for tasks assessed     SENSATION: 11/14/21 [facility]  EDEMA:  11/14/21 Circumferential: Joint Line: Rt: 37 cm /Lt: 39.5 cm   POSTURE:  11/14/21 rounded shoulders, forward head, and flexed trunk   LOWER EXTREMITY ROM:  Active ROM Right Eval 11/14/21 Left Eval 11/14/21 Left 11/20/21  Knee flexion 121 85 100 AAROM with strap supine  Knee extension -2 (seated LAQ) -18 (seated LAQ)    (Blank rows = not tested)  Passive ROM Right Eval 11/14/21 Left Eval 11/14/21  Knee flexion  92  Knee extension  -  1   (Blank rows = not tested)  LOWER EXTREMITY MMT:  MMT Right Eval 11/14/21 Left Eval 11/14/21  Knee flexion  3-/5  Knee extension  3-/5   (Blank rows = not tested)   GAIT: 11/14/21: Distance walked: 100' in clinic Assistive device utilized: Single point cane Level of assistance: Modified independence Comments: decreased stance on Lt, decreased hip/knee flexion on Lt, flexed trunk    TODAY'S TREATMENT: 11/18/2021: Therex: Nustep lvl 5 7 mins  UE/LE Leg press double leg 50 lbs x 20, Lt leg only 25 lbs x 10 then 5 Supine quad set  5 sec hold x 10  Supine heel slides AAROM 10 sec X 5 Supine SLR X10 Seated SLR X10 Seated LAQ 2# X20 Sit to stand with UE support to stand and no UE support to lower, slow eccentric lower X5  Manual: left knee PROM with overpressure to tolerance, manual hamstring/gastroc stretching  11/14/21 See HEP - performed trial reps with mod cues needed for comprehension   PATIENT EDUCATION:  11/14/21 Education details: HEP Person educated: Patient and Child(ren) Education method: Explanation, Demonstration, and Handouts Education comprehension: verbalized understanding, returned demonstration, and needs further education   HOME EXERCISE PROGRAM: Access Code: [email or web address] URL: [email or web address] Date: 11/14/2021 Prepared by: Faustino Congress  Exercises - Long Sitting Quad Set  - 5-10 x daily - 7 x weekly - 1 sets - 5-10 reps - 5 sec hold - Supine Heel Slide with Strap  - 5-10 x daily - 7 x weekly - 1 sets - 5-10 reps - Seated Knee Extension AROM  - 3-5 x daily - 7 x weekly - 1 sets - 10 reps - 3-5 sec hold - Seated Knee Flexion AAROM  - 3-5 x daily - 7 x weekly - 1 sets - 10 reps - 5-10 sec hold - Supine Knee Extension Mobilization with Weight  - 2-3 x daily - 7 x weekly - 1 sets - 1 reps - 3-5 min hold  ASSESSMENT:  CLINICAL IMPRESSION: She had good tolerance to strength progression today. We will monitor her soreness from this next time. She had improved overall knee flexion ROM noted today.   OBJECTIVE IMPAIRMENTS Abnormal gait, decreased activity tolerance, decreased balance, decreased mobility, difficulty walking, decreased ROM, decreased strength, increased edema, increased fascial restrictions, impaired flexibility, postural dysfunction, and pain.   ACTIVITY LIMITATIONS carrying, lifting, bending, sitting, standing, squatting, sleeping, stairs, transfers, bed mobility, and  locomotion level  PARTICIPATION LIMITATIONS: meal prep, cleaning, laundry, driving, shopping, community activity, and occupation  PERSONAL FACTORS 1 comorbidity: hypothyroidism  are also affecting patient's functional outcome.   REHAB POTENTIAL: Good  CLINICAL DECISION MAKING: Stable/uncomplicated  EVALUATION COMPLEXITY: Low   GOALS: Goals reviewed with patient? Yes  SHORT TERM GOALS: Target date: 12/12/2021  Independent with initial HEP Goal status:on going - assessed 11/18/2021  2.  Lt knee AROM 0-10-100 for improved function. Goal status:on going - assessed 11/18/2021  LONG TERM GOALS: Target date: 01/09/2022  Independent with final HEP Goal status: INITIAL  2.  FOTO score improved to 52 Goal status: INITIAL  3.  Lt knee AROM improved to 0-110 for improved mobility and function Goal status: INIITAL  4.  Report pain < 2/10 with standing/walking activities for improved function Goal status: INITIAL  5.  Amb without AD without significant deviations for improved function. Goal status: INITIAL    PLAN: PT FREQUENCY: 2x/week  PT DURATION: 8 weeks  PLANNED INTERVENTIONS: Therapeutic exercises, Therapeutic activity, Neuromuscular re-education, Balance training, Gait training,  Patient/Family education, Self Care, Joint mobilization, Stair training, DME instructions, Aquatic Therapy, Dry Needling, Electrical stimulation, Cryotherapy, Moist heat, scar mobilization, Taping, Vasopneumatic device, Ultrasound, Ionotophoresis '4mg'$ /ml Dexamethasone, Manual therapy, and Re-evaluation  PLAN FOR NEXT SESSION:  Progressive end range mobility gains and strengthening.   Elsie Ra, PT, DPT 11/20/21 3:38 PM

## 2021-11-21 ENCOUNTER — Encounter: Payer: Medicare Other | Admitting: Physical Therapy

## 2021-11-26 ENCOUNTER — Ambulatory Visit (INDEPENDENT_AMBULATORY_CARE_PROVIDER_SITE_OTHER): Payer: Medicare Other | Admitting: Rehabilitative and Restorative Service Providers"

## 2021-11-26 ENCOUNTER — Encounter: Payer: Self-pay | Admitting: Rehabilitative and Restorative Service Providers"

## 2021-11-26 DIAGNOSIS — R600 Localized edema: Secondary | ICD-10-CM | POA: Diagnosis not present

## 2021-11-26 DIAGNOSIS — R2681 Unsteadiness on feet: Secondary | ICD-10-CM

## 2021-11-26 DIAGNOSIS — M25662 Stiffness of left knee, not elsewhere classified: Secondary | ICD-10-CM

## 2021-11-26 DIAGNOSIS — M25562 Pain in left knee: Secondary | ICD-10-CM

## 2021-11-26 DIAGNOSIS — R2689 Other abnormalities of gait and mobility: Secondary | ICD-10-CM

## 2021-11-26 DIAGNOSIS — G8929 Other chronic pain: Secondary | ICD-10-CM | POA: Diagnosis not present

## 2021-11-26 NOTE — Therapy (Signed)
OUTPATIENT PHYSICAL THERAPY TREATMENT    Patient Name: Toni Medina MRN: [id number] DOB:[birth year], 88 y.o., female Today's Date: 11/26/2021  PCP: Seward Carol, MD  REFERRING PROVIDER: Mcarthur Rossetti, MD   END OF SESSION:    PT End of Session - 11/26/21 1355     Visit Number 4    Number of Visits 16    Date for PT Re-Evaluation 01/09/22    Authorization Type Medicare/BCBS    Progress Note Due on Visit 10    PT Start Time 1346    PT Stop Time 1431    PT Time Calculation (min) 45 min    Activity Tolerance Patient tolerated treatment well;No increased pain    Behavior During Therapy WFL for tasks assessed/performed               Past Medical History:  Diagnosis Date   Arthritis    Hypothyroidism    Past Surgical History:  Procedure Laterality Date   BREAST EXCISIONAL BIOPSY Left 2014   COLONOSCOPY     MENISCUS REPAIR     TOTAL KNEE ARTHROPLASTY Left 11/01/2021   Procedure: LEFT TOTAL KNEE ARTHROPLASTY;  Surgeon: Mcarthur Rossetti, MD;  Location: [facility];  Service: Orthopedics;  Laterality: Left;   Patient Active Problem List   Diagnosis Date Noted   Status post total left knee replacement 11/01/2021   Unilateral primary osteoarthritis, left knee 09/10/2020    REFERRING DIAG: M17.12 (ICD-10-CM) - Unilateral primary osteoarthritis, left knee Z96.652 (ICD-10-CM) - Status post total left knee replacement   THERAPY DIAG:  Chronic pain of left knee  Unsteadiness on feet  Other abnormalities of gait and mobility  Localized edema  Stiffness of left knee, not elsewhere classified  Rationale for Evaluation and Treatment Rehabilitation  ONSET DATE: 11/01/21   SUBJECTIVE:   SUBJECTIVE STATEMENT: Pt indicated having some low back complaints c standing upright longer distances.  Pt indicated no specific knee pain upon arrival today.   PERTINENT HISTORY: hypothyroidism  PAIN:  NPRS scale: 0/10 Pain location: Lt knee Pain description:  aching Aggravating factors: exercises/bending knee, movement, static positioning for too long Relieving factors: meds, rest, ice  PRECAUTIONS: None  WEIGHT BEARING RESTRICTIONS No  FALLS:  Has patient fallen in last 6 months? No  LIVING ENVIRONMENT: Lives with: lives alone and daughter/son providing 24 hour care for the next few weeks Lives in: House/apartment Stairs: Yes: External: 2 steps; no railings, holds onto door jam; has 8 steps with handrail as well Has following equipment at home: Single point cane, Walker - 2 wheeled, Manufacturing engineer  OCCUPATION: retired: Personnel officer  PLOF: Independent and Leisure: cooking and baking, reading, no regular exercise  PATIENT GOALS regain regular function, walk without a cane   OBJECTIVE:   PATIENT SURVEYS:  11/14/21: FOTO 38 (predicted 52)  COGNITION: 11/14/21 Overall cognitive status: Within functional limits for tasks assessed     SENSATION: 11/14/21 [facility]  EDEMA:  11/14/21 Circumferential: Joint Line: Rt: 37 cm /Lt: 39.5 cm   POSTURE:  11/14/21 rounded shoulders, forward head, and flexed trunk   LOWER EXTREMITY ROM:  Active ROM Right Eval 11/14/21 Left Eval 11/14/21 Left 11/20/21  Knee flexion 121 85 100 AAROM with strap supine  Knee extension -2 (seated LAQ) -18 (seated LAQ)    (Blank rows = not tested)  Passive ROM Right Eval 11/14/21 Left Eval 11/14/21  Knee flexion  92  Knee extension  -1   (Blank rows = not tested)  LOWER EXTREMITY MMT:  MMT Right Eval 11/14/21 Left Eval 11/14/21 Left 11/26/2021  Knee flexion  3-/5 5/5  Knee extension  3-/5 4/5   (Blank rows = not tested)   GAIT: 11/14/21: Distance walked: 100' in clinic Assistive device utilized: Single point cane Level of assistance: Modified independence Comments: decreased stance on Lt, decreased hip/knee flexion on Lt, flexed trunk    TODAY'S TREATMENT: 11/26/2021: Therex: Nustep lvl 5 9 mins UE/LE Leg press single leg Lt  31 lbs 2 x 15, 25 lbs 2 x 15 Rt Seated SLR x 10 bilateral c slow lowering focus Sit to stand to sit x 3 (cues for slow lowering focus) Additional time spent in review of HEP for both legs strengthening.   Neuro Re-ed Tandem stance 45 seconds x 2 bilateral  TherActivity Step up 4 inch Lt leg leading x 15 Education and trial of stepping down on normal stairs c cues for sequencing to offload Lt leg by stepping down with Lt leg.  During trial, more difficulty was noted in WB control on Rt leg vs. Lt leg due to weakness in Rt leg as well in WB.    11/20/2021: Therex: Nustep lvl 5 7 mins UE/LE Leg press double leg 50 lbs x 20, Lt leg only 25 lbs x 10 then 5 Supine quad set  5 sec hold x 10  Supine heel slides AAROM 10 sec X 5 Supine SLR X10 Seated SLR X10 Seated LAQ 2# X20 Sit to stand with UE support to stand and no UE support to lower, slow eccentric lower X5  Manual: left knee PROM with overpressure to tolerance, manual hamstring/gastroc stretching  11/18/2021: Therex: Nustep lvl 5 7 mins UE/LE Leg press double leg 50 lbs x 20, Lt leg only 25 lbs x 10 then 5 Supine quad set  5 sec hold x 10  Supine heel slides AAROM 10 sec X 5 Supine SLR X10 Seated SLR X10 Seated LAQ 2# X20 Sit to stand with UE support to stand and no UE support to lower, slow eccentric lower X5  Manual: left knee PROM with overpressure to tolerance, manual hamstring/gastroc stretching  11/14/21 See HEP - performed trial reps with mod cues needed for comprehension   PATIENT EDUCATION:  11/14/21 Education details: HEP Person educated: Patient and Child(ren) Education method: Explanation, Demonstration, and Handouts Education comprehension: verbalized understanding, returned demonstration, and needs further education   HOME EXERCISE PROGRAM: Access Code: [email or web address] URL: [email or web address] Date: 11/14/2021 Prepared by: Faustino Congress  Exercises - Long Sitting Quad Set  - 5-10 x  daily - 7 x weekly - 1 sets - 5-10 reps - 5 sec hold - Supine Heel Slide with Strap  - 5-10 x daily - 7 x weekly - 1 sets - 5-10 reps - Seated Knee Extension AROM  - 3-5 x daily - 7 x weekly - 1 sets - 10 reps - 3-5 sec hold - Seated Knee Flexion AAROM  - 3-5 x daily - 7 x weekly - 1 sets - 10 reps - 5-10 sec hold - Supine Knee Extension Mobilization with Weight  - 2-3 x daily - 7 x weekly - 1 sets - 1 reps - 3-5 min hold  ASSESSMENT:  CLINICAL IMPRESSION: Mobility and stairs in particular limited by both leg weakness.  Encouraged use of strengthening intervention on both legs at home to address.  Continued skilled PT services encouraged at this time.   OBJECTIVE IMPAIRMENTS Abnormal gait, decreased activity tolerance, decreased balance, decreased mobility, difficulty walking, decreased ROM, decreased  strength, increased edema, increased fascial restrictions, impaired flexibility, postural dysfunction, and pain.   ACTIVITY LIMITATIONS carrying, lifting, bending, sitting, standing, squatting, sleeping, stairs, transfers, bed mobility, and locomotion level  PARTICIPATION LIMITATIONS: meal prep, cleaning, laundry, driving, shopping, community activity, and occupation  PERSONAL FACTORS 1 comorbidity: hypothyroidism  are also affecting patient's functional outcome.   REHAB POTENTIAL: Good  CLINICAL DECISION MAKING: Stable/uncomplicated  EVALUATION COMPLEXITY: Low   GOALS: Goals reviewed with patient? Yes  SHORT TERM GOALS: Target date: 12/12/2021  Independent with initial HEP Goal status:on going - assessed 11/18/2021  2.  Lt knee AROM 0-10-100 for improved function. Goal status:on going - assessed 11/18/2021  LONG TERM GOALS: Target date: 01/09/2022  Independent with final HEP Goal status: INITIAL  2.  FOTO score improved to 52 Goal status: INITIAL  3.  Lt knee AROM improved to 0-110 for improved mobility and function Goal status: INIITAL  4.  Report pain < 2/10 with  standing/walking activities for improved function Goal status: INITIAL  5.  Amb without AD without significant deviations for improved function. Goal status: INITIAL    PLAN: PT FREQUENCY: 2x/week  PT DURATION: 8 weeks  PLANNED INTERVENTIONS: Therapeutic exercises, Therapeutic activity, Neuromuscular re-education, Balance training, Gait training, Patient/Family education, Self Care, Joint mobilization, Stair training, DME instructions, Aquatic Therapy, Dry Needling, Electrical stimulation, Cryotherapy, Moist heat, scar mobilization, Taping, Vasopneumatic device, Ultrasound, Ionotophoresis '4mg'$ /ml Dexamethasone, Manual therapy, and Re-evaluation  PLAN FOR NEXT SESSION:  Bilateral LE strengthening, stair navigation improvements, Static/dynamic balance.   Scot Jun, PT, DPT, OCS, ATC 11/26/21  2:33 PM

## 2021-11-28 ENCOUNTER — Ambulatory Visit (INDEPENDENT_AMBULATORY_CARE_PROVIDER_SITE_OTHER): Payer: Medicare Other | Admitting: Physical Therapy

## 2021-11-28 ENCOUNTER — Encounter: Payer: Self-pay | Admitting: Physical Therapy

## 2021-11-28 DIAGNOSIS — M25562 Pain in left knee: Secondary | ICD-10-CM | POA: Diagnosis not present

## 2021-11-28 DIAGNOSIS — M25662 Stiffness of left knee, not elsewhere classified: Secondary | ICD-10-CM | POA: Diagnosis not present

## 2021-11-28 DIAGNOSIS — M6281 Muscle weakness (generalized): Secondary | ICD-10-CM

## 2021-11-28 DIAGNOSIS — R2681 Unsteadiness on feet: Secondary | ICD-10-CM

## 2021-11-28 DIAGNOSIS — G8929 Other chronic pain: Secondary | ICD-10-CM

## 2021-11-28 DIAGNOSIS — R2689 Other abnormalities of gait and mobility: Secondary | ICD-10-CM

## 2021-11-28 DIAGNOSIS — M25561 Pain in right knee: Secondary | ICD-10-CM

## 2021-11-28 DIAGNOSIS — R600 Localized edema: Secondary | ICD-10-CM | POA: Diagnosis not present

## 2021-11-28 NOTE — Therapy (Signed)
OUTPATIENT PHYSICAL THERAPY TREATMENT    Patient Name: Toni Medina MRN: [id number] DOB:08/24/1931, 88 y.o., female Today's Date: 11/28/2021  PCP: Seward Carol, MD  REFERRING PROVIDER: Mcarthur Rossetti, MD   END OF SESSION:    PT End of Session - 11/28/21 1258     Visit Number 5    Number of Visits 16    Date for PT Re-Evaluation 01/09/22    Authorization Type Medicare/BCBS    Progress Note Due on Visit 10    PT Start Time 3212    PT Stop Time 1340    PT Time Calculation (min) 41 min    Activity Tolerance Patient tolerated treatment well;No increased pain    Behavior During Therapy WFL for tasks assessed/performed                Past Medical History:  Diagnosis Date   Arthritis    Hypothyroidism    Past Surgical History:  Procedure Laterality Date   BREAST EXCISIONAL BIOPSY Left 2014   COLONOSCOPY     MENISCUS REPAIR     TOTAL KNEE ARTHROPLASTY Left 11/01/2021   Procedure: LEFT TOTAL KNEE ARTHROPLASTY;  Surgeon: Mcarthur Rossetti, MD;  Location: [facility];  Service: Orthopedics;  Laterality: Left;   Patient Active Problem List   Diagnosis Date Noted   Status post total left knee replacement 11/01/2021   Unilateral primary osteoarthritis, left knee 09/10/2020    REFERRING DIAG: M17.12 (ICD-10-CM) - Unilateral primary osteoarthritis, left knee Z96.652 (ICD-10-CM) - Status post total left knee replacement   THERAPY DIAG:  Chronic pain of left knee  Unsteadiness on feet  Other abnormalities of gait and mobility  Localized edema  Stiffness of left knee, not elsewhere classified  Muscle weakness (generalized)  Chronic pain of right knee  Rationale for Evaluation and Treatment Rehabilitation  ONSET DATE: 11/01/21   SUBJECTIVE:   SUBJECTIVE STATEMENT: Knee is still a little painful, but doing well.  PERTINENT HISTORY: hypothyroidism  PAIN:  NPRS scale: 2/10 Pain location: Lt knee Pain description: aching Aggravating  factors: exercises/bending knee, movement, static positioning for too long Relieving factors: meds, rest, ice  PRECAUTIONS: None  WEIGHT BEARING RESTRICTIONS No  FALLS:  Has patient fallen in last 6 months? No  LIVING ENVIRONMENT: Lives with: lives alone and daughter/son providing 24 hour care for the next few weeks Lives in: House/apartment Stairs: Yes: External: 2 steps; no railings, holds onto door jam; has 8 steps with handrail as well Has following equipment at home: Single point cane, Walker - 2 wheeled, Manufacturing engineer  OCCUPATION: retired: Personnel officer  PLOF: Independent and Leisure: cooking and baking, reading, no regular exercise  PATIENT GOALS regain regular function, walk without a cane   OBJECTIVE:   PATIENT SURVEYS:  11/14/21: FOTO 38 (predicted 52)  COGNITION: 11/14/21 Overall cognitive status: Within functional limits for tasks assessed     S[facility]ATION: 11/14/21 [facility]  EDEMA:  11/14/21 Circumferential: Joint Line: Rt: 37 cm /Lt: 39.5 cm   POSTURE:  11/14/21 rounded shoulders, forward head, and flexed trunk   LOWER EXTREMITY ROM:  Active ROM Right Eval 11/14/21 Left Eval 11/14/21 Left 11/20/21 Left 11/28/21  Knee flexion 121 85 100 AAROM with strap supine 112  Knee extension -2 (seated LAQ) -18 (seated LAQ)  -7 (seated LAQ)   (Blank rows = not tested)  Passive ROM Right Eval 11/14/21 Left Eval 11/14/21  Knee flexion  92  Knee extension  -1   (Blank rows = not tested)  LOWER EXTREMITY MMT:  MMT Right Eval 11/14/21 Left Eval 11/14/21 Left 11/26/2021  Knee flexion  3-/5 5/5  Knee extension  3-/5 4/5   (Blank rows = not tested)   GAIT: 11/14/21: Distance walked: 100' in clinic Assistive device utilized: Single point cane Level of assistance: Modified independence Comments: decreased stance on Lt, decreased hip/knee flexion on Lt, flexed trunk    TODAY'S TREATMENT: 11/28/21 Therex: Nustep lvl 6 x 8 mins UE/LE Leg press  single leg 25# 2x10 bil Seated LAQ 3# 2x10 bil Forward step ups onto 4" step without UE support x 10 bil Heel taps on 2" step with light UE support x 10 reps bil (trial of 4" with increased difficulty) Side stepping on foam x 3 laps; intermittent UE support ROM measurements as noted above   11/26/2021: Therex: Nustep lvl 5 9 mins UE/LE Leg press single leg Lt 31 lbs 2 x 15, 25 lbs 2 x 15 Rt Seated SLR x 10 bilateral c slow lowering focus Sit to stand to sit x 3 (cues for slow lowering focus) Additional time spent in review of HEP for both legs strengthening.   Neuro Re-ed Tandem stance 45 seconds x 2 bilateral  TherActivity Step up 4 inch Lt leg leading x 15 Education and trial of stepping down on normal stairs c cues for sequencing to offload Lt leg by stepping down with Lt leg.  During trial, more difficulty was noted in WB control on Rt leg vs. Lt leg due to weakness in Rt leg as well in WB.    11/20/2021: Therex: Nustep lvl 5 7 mins UE/LE Leg press double leg 50 lbs x 20, Lt leg only 25 lbs x 10 then 5 Supine quad set  5 sec hold x 10  Supine heel slides AAROM 10 sec X 5 Supine SLR X10 Seated SLR X10 Seated LAQ 2# X20 Sit to stand with UE support to stand and no UE support to lower, slow eccentric lower X5  Manual: left knee PROM with overpressure to tolerance, manual hamstring/gastroc stretching  11/18/2021: Therex: Nustep lvl 5 7 mins UE/LE Leg press double leg 50 lbs x 20, Lt leg only 25 lbs x 10 then 5 Supine quad set  5 sec hold x 10  Supine heel slides AAROM 10 sec X 5 Supine SLR X10 Seated SLR X10 Seated LAQ 2# X20 Sit to stand with UE support to stand and no UE support to lower, slow eccentric lower X5  Manual: left knee PROM with overpressure to tolerance, manual hamstring/gastroc stretching  11/14/21 See HEP - performed trial reps with mod cues needed for comprehension   PATIENT EDUCATION:  11/14/21 Education details: HEP Person educated: Patient  and Child(ren) Education method: Explanation, Demonstration, and Handouts Education comprehension: verbalized understanding, returned demonstration, and needs further education   HOME EXERCISE PROGRAM: Access Code: [email or web address] URL: [email or web address] Date: 11/14/2021 Prepared by: Faustino Congress  Exercises - Long Sitting Quad Set  - 5-10 x daily - 7 x weekly - 1 sets - 5-10 reps - 5 sec hold - Supine Heel Slide with Strap  - 5-10 x daily - 7 x weekly - 1 sets - 5-10 reps - Seated Knee Extension AROM  - 3-5 x daily - 7 x weekly - 1 sets - 10 reps - 3-5 sec hold - Seated Knee Flexion AAROM  - 3-5 x daily - 7 x weekly - 1 sets - 10 reps - 5-10 sec hold - Supine Knee Extension Mobilization with Weight  -  2-3 x daily - 7 x weekly - 1 sets - 1 reps - 3-5 min hold  ASSESSMENT:  CLINICAL IMPRESSION: Pt with excellent progress in AROM today, and overall progressing well with PT.  Needs bil strengthening.  Will continue to benefit from PT to maximize function.   OBJECTIVE IMPAIRMENTS Abnormal gait, decreased activity tolerance, decreased balance, decreased mobility, difficulty walking, decreased ROM, decreased strength, increased edema, increased fascial restrictions, impaired flexibility, postural dysfunction, and pain.   ACTIVITY LIMITATIONS carrying, lifting, bending, sitting, standing, squatting, sleeping, stairs, transfers, bed mobility, and locomotion level  PARTICIPATION LIMITATIONS: meal prep, cleaning, laundry, driving, shopping, community activity, and occupation  PERSONAL FACTORS 1 comorbidity: hypothyroidism  are also affecting patient's functional outcome.   REHAB POTENTIAL: Good  CLINICAL DECISION MAKING: Stable/uncomplicated  EVALUATION COMPLEXITY: Low   GOALS: Goals reviewed with patient? Yes  SHORT TERM GOALS: Target date: 12/12/2021  Independent with initial HEP Goal status:on going - assessed 11/18/2021  2.  Lt knee AROM 0-10-100 for improved  function. Goal status:MET 11/28/21  LONG TERM GOALS: Target date: 01/09/2022  Independent with final HEP Goal status: INITIAL  2.  FOTO score improved to 52 Goal status: INITIAL  3.  Lt knee AROM improved to 0-110 for improved mobility and function Goal status: INIITAL  4.  Report pain < 2/10 with standing/walking activities for improved function Goal status: INITIAL  5.  Amb without AD without significant deviations for improved function. Goal status: INITIAL    PLAN: PT FREQUENCY: 2x/week  PT DURATION: 8 weeks  PLANNED INTERVENTIONS: Therapeutic exercises, Therapeutic activity, Neuromuscular re-education, Balance training, Gait training, Patient/Family education, Self Care, Joint mobilization, Stair training, DME instructions, Aquatic Therapy, Dry Needling, Electrical stimulation, Cryotherapy, Moist heat, scar mobilization, Taping, Vasopneumatic device, Ultrasound, Ionotophoresis 53m/ml Dexamethasone, Manual therapy, and Re-evaluation  PLAN FOR NEXT SESSION:   Bilateral LE strengthening, stair navigation improvements, Static/dynamic balance.   SLaureen Abrahams PT, DPT 11/28/21 1:43 PM

## 2021-11-30 DIAGNOSIS — Z23 Encounter for immunization: Secondary | ICD-10-CM | POA: Diagnosis not present

## 2021-12-02 ENCOUNTER — Telehealth: Payer: Self-pay | Admitting: *Deleted

## 2021-12-02 NOTE — Telephone Encounter (Signed)
Patient's daughter called asking about Meloxicam her mother was taking prior to surgery for arthritis in other areas of the body. Asked if she can restart since she is no longer taking aspirin.

## 2021-12-02 NOTE — Telephone Encounter (Signed)
Call back to patient's daughter and reviewed that Dr. Ninfa Linden was agreeable to her restarting her Meloxicam. 30 day call completed.

## 2021-12-03 ENCOUNTER — Encounter: Payer: Self-pay | Admitting: Physical Therapy

## 2021-12-03 ENCOUNTER — Ambulatory Visit (INDEPENDENT_AMBULATORY_CARE_PROVIDER_SITE_OTHER): Payer: Medicare Other | Admitting: Physical Therapy

## 2021-12-03 DIAGNOSIS — M25662 Stiffness of left knee, not elsewhere classified: Secondary | ICD-10-CM

## 2021-12-03 DIAGNOSIS — R2681 Unsteadiness on feet: Secondary | ICD-10-CM

## 2021-12-03 DIAGNOSIS — R600 Localized edema: Secondary | ICD-10-CM

## 2021-12-03 DIAGNOSIS — M6281 Muscle weakness (generalized): Secondary | ICD-10-CM | POA: Diagnosis not present

## 2021-12-03 DIAGNOSIS — M25562 Pain in left knee: Secondary | ICD-10-CM | POA: Diagnosis not present

## 2021-12-03 DIAGNOSIS — R2689 Other abnormalities of gait and mobility: Secondary | ICD-10-CM | POA: Diagnosis not present

## 2021-12-03 DIAGNOSIS — G8929 Other chronic pain: Secondary | ICD-10-CM

## 2021-12-03 NOTE — Therapy (Signed)
OUTPATIENT PHYSICAL THERAPY TREATMENT    Patient Name: Toni Medina MRN: [id number] DOB:[birth year], 88 y.o., female Today's Date: 12/03/2021  PCP: Seward Carol, MD  REFERRING PROVIDER: Mcarthur Rossetti, MD   END OF SESSION:    PT End of Session - 12/03/21 1305     Visit Number 6    Number of Visits 16    Date for PT Re-Evaluation 01/09/22    Authorization Type Medicare/BCBS    Progress Note Due on Visit 10    PT Start Time 1301    PT Stop Time 1342    PT Time Calculation (min) 41 min    Activity Tolerance Patient tolerated treatment well;No increased pain    Behavior During Therapy WFL for tasks assessed/performed                 Past Medical History:  Diagnosis Date   Arthritis    Hypothyroidism    Past Surgical History:  Procedure Laterality Date   BREAST EXCISIONAL BIOPSY Left 2014   COLONOSCOPY     MENISCUS REPAIR     TOTAL KNEE ARTHROPLASTY Left 11/01/2021   Procedure: LEFT TOTAL KNEE ARTHROPLASTY;  Surgeon: Mcarthur Rossetti, MD;  Location: [facility];  Service: Orthopedics;  Laterality: Left;   Patient Active Problem List   Diagnosis Date Noted   Status post total left knee replacement 11/01/2021   Unilateral primary osteoarthritis, left knee 09/10/2020    REFERRING DIAG: M17.12 (ICD-10-CM) - Unilateral primary osteoarthritis, left knee Z96.652 (ICD-10-CM) - Status post total left knee replacement   THERAPY DIAG:  Chronic pain of left knee  Unsteadiness on feet  Other abnormalities of gait and mobility  Localized edema  Stiffness of left knee, not elsewhere classified  Muscle weakness (generalized)  Rationale for Evaluation and Treatment Rehabilitation  ONSET DATE: 11/01/21   SUBJECTIVE:   SUBJECTIVE STATEMENT: "A little discomfort."   PERTINENT HISTORY: hypothyroidism  PAIN:  NPRS scale: 2/10 Pain location: Lt knee Pain description: aching Aggravating factors: exercises/bending knee, movement, static  positioning for too long Relieving factors: meds, rest, ice  PRECAUTIONS: None  WEIGHT BEARING RESTRICTIONS No  FALLS:  Has patient fallen in last 6 months? No  LIVING ENVIRONMENT: Lives with: lives alone and daughter/son providing 24 hour care for the next few weeks Lives in: House/apartment Stairs: Yes: External: 2 steps; no railings, holds onto door jam; has 8 steps with handrail as well Has following equipment at home: Single point cane, Walker - 2 wheeled, and Electronics engineer  OCCUPATION: retired: Personnel officer  PLOF: Independent and Leisure: cooking and baking, reading, no regular exercise  PATIENT GOALS regain regular function, walk without a cane   OBJECTIVE:   PATIENT SURVEYS:  11/14/21: FOTO 38 (predicted 52)  COGNITION: 11/14/21 Overall cognitive status: Within functional limits for tasks assessed     SENSATION: 11/14/21 [facility]  EDEMA:  11/14/21 Circumferential: Joint Line: Rt: 37 cm /Lt: 39.5 cm   POSTURE:  11/14/21 rounded shoulders, forward head, and flexed trunk   LOWER EXTREMITY ROM:  Active ROM Right Eval 11/14/21 Left Eval 11/14/21 Left 11/20/21 Left 11/28/21 Left 12/03/21  Knee flexion 121 85 100 AAROM with strap supine 112 117  Knee extension -2 (seated LAQ) -18 (seated LAQ)  -7 (seated LAQ) -7 (seated LAQ)   (Blank rows = not tested)  Passive ROM Right Eval 11/14/21 Left Eval 11/14/21  Knee flexion  92  Knee extension  -1   (Blank rows = not tested)  LOWER EXTREMITY MMT:  MMT Right Eval 11/14/21 Left Eval 11/14/21 Left 11/26/2021  Knee flexion  3-/5 5/5  Knee extension  3-/5 4/5   (Blank rows = not tested)   GAIT: 11/14/21: Distance walked: 100' in clinic Assistive device utilized: Single point cane Level of assistance: Modified independence Comments: decreased stance on Lt, decreased hip/knee flexion on Lt, flexed trunk    TODAY'S TREATMENT: 12/03/21 Therex: Recumbent bike x 5 min; seat 4; L2/1 Forward step up  x 10 reps bil onto 4" step Heel taps on 4" step with light UE support x 10 reps bil Seated SLR x10 reps bil Calf raises x20 reps Hip extension standing x 20 reps bil Hip abduction standing x 20 reps bil Seated LAQ 2x10; 3# AROM measurements - see above Supine hamstring stretch with overpressure at distal thigh 2x30 sec; Lt Bridges x 10 reps; 5 sec hold    11/28/21 Therex: Nustep lvl 6 x 8 mins UE/LE Leg press single leg 25# 2x10 bil Seated LAQ 3# 2x10 bil Forward step ups onto 4" step without UE support x 10 bil Heel taps on 2" step with light UE support x 10 reps bil (trial of 4" with increased difficulty) Side stepping on foam x 3 laps; intermittent UE support ROM measurements as noted above   11/26/2021: Therex: Nustep lvl 5 9 mins UE/LE Leg press single leg Lt 31 lbs 2 x 15, 25 lbs 2 x 15 Rt Seated SLR x 10 bilateral c slow lowering focus Sit to stand to sit x 3 (cues for slow lowering focus) Additional time spent in review of HEP for both legs strengthening.   Neuro Re-ed Tandem stance 45 seconds x 2 bilateral  TherActivity Step up 4 inch Lt leg leading x 15 Education and trial of stepping down on normal stairs c cues for sequencing to offload Lt leg by stepping down with Lt leg.  During trial, more difficulty was noted in WB control on Rt leg vs. Lt leg due to weakness in Rt leg as well in WB.    11/20/2021: Therex: Nustep lvl 5 7 mins UE/LE Leg press double leg 50 lbs x 20, Lt leg only 25 lbs x 10 then 5 Supine quad set  5 sec hold x 10  Supine heel slides AAROM 10 sec X 5 Supine SLR X10 Seated SLR X10 Seated LAQ 2# X20 Sit to stand with UE support to stand and no UE support to lower, slow eccentric lower X5  Manual: left knee PROM with overpressure to tolerance, manual hamstring/gastroc stretching   PATIENT EDUCATION:  11/14/21 Education details: HEP Person educated: Patient and Child(ren) Education method: Explanation, Demonstration, and  Handouts Education comprehension: verbalized understanding, returned demonstration, and needs further education   HOME EXERCISE PROGRAM: Access Code: [email or web address] URL: [email or web address] Date: 12/03/2021 Prepared by: Faustino Congress  Exercises - Long Sitting Quad Set  - 5-10 x daily - 7 x weekly - 1 sets - 5-10 reps - 5 sec hold - Supine Heel Slide with Strap  - 5-10 x daily - 7 x weekly - 1 sets - 5-10 reps - Seated Knee Extension AROM  - 3-5 x daily - 7 x weekly - 1 sets - 10 reps - 3-5 sec hold - Seated Knee Flexion AAROM  - 3-5 x daily - 7 x weekly - 1 sets - 10 reps - 5-10 sec hold - Supine Knee Extension Mobilization with Weight  - 2-3 x daily - 7 x weekly - 1 sets - 1 reps - 3-5  min hold - Forward Step Up with Counter Support  - 1 x daily - 7 x weekly - 1 sets - 10 reps - Lateral Step Down  - 1 x daily - 7 x weekly - 1 sets - 10 reps - Seated Straight Leg Raise  - 1 x daily - 7 x weekly - 1 sets - 10 reps - Heel Raises with Counter Support  - 1 x daily - 7 x weekly - 1 sets - 20 reps - Standing Hip Extension with Counter Support  - 1 x daily - 7 x weekly - 1 sets - 20 reps - Standing Hip Abduction with Counter Support  - 1 x daily - 7 x weekly - 1 sets - 20 reps  ASSESSMENT:  CLINICAL IMPRESSION: Pt continues to progress well with PT, and provided strengthening exercises to perform at home for both knees.  Will continue to benefit from PT to maximize function.  OBJECTIVE IMPAIRMENTS Abnormal gait, decreased activity tolerance, decreased balance, decreased mobility, difficulty walking, decreased ROM, decreased strength, increased edema, increased fascial restrictions, impaired flexibility, postural dysfunction, and pain.   ACTIVITY LIMITATIONS carrying, lifting, bending, sitting, standing, squatting, sleeping, stairs, transfers, bed mobility, and locomotion level  PARTICIPATION LIMITATIONS: meal prep, cleaning, laundry, driving, shopping, community activity, and  occupation  PERSONAL FACTORS 1 comorbidity: hypothyroidism  are also affecting patient's functional outcome.   REHAB POTENTIAL: Good  CLINICAL DECISION MAKING: Stable/uncomplicated  EVALUATION COMPLEXITY: Low   GOALS: Goals reviewed with patient? Yes  SHORT TERM GOALS: Target date: 12/12/2021  Independent with initial HEP Goal status:on going - assessed 11/18/2021  2.  Lt knee AROM 0-10-100 for improved function. Goal status:MET 11/28/21  LONG TERM GOALS: Target date: 01/09/2022  Independent with final HEP Goal status: INITIAL  2.  FOTO score improved to 52 Goal status: INITIAL  3.  Lt knee AROM improved to 0-110 for improved mobility and function Goal status: INIITAL  4.  Report pain < 2/10 with standing/walking activities for improved function Goal status: INITIAL  5.  Amb without AD without significant deviations for improved function. Goal status: INITIAL    PLAN: PT FREQUENCY: 2x/week  PT DURATION: 8 weeks  PLANNED INTERVENTIONS: Therapeutic exercises, Therapeutic activity, Neuromuscular re-education, Balance training, Gait training, Patient/Family education, Self Care, Joint mobilization, Stair training, DME instructions, Aquatic Therapy, Dry Needling, Electrical stimulation, Cryotherapy, Moist heat, scar mobilization, Taping, Vasopneumatic device, Ultrasound, Ionotophoresis 12m/ml Dexamethasone, Manual therapy, and Re-evaluation  PLAN FOR NEXT SESSION:   check updated HEP, Bilateral LE strengthening, stairs/step ups, Static/dynamic balance.   SLaureen Abrahams PT, DPT 12/03/21 1:56 PM

## 2021-12-05 ENCOUNTER — Ambulatory Visit (INDEPENDENT_AMBULATORY_CARE_PROVIDER_SITE_OTHER): Payer: Medicare Other | Admitting: Physical Therapy

## 2021-12-05 ENCOUNTER — Encounter: Payer: Self-pay | Admitting: Physical Therapy

## 2021-12-05 DIAGNOSIS — R600 Localized edema: Secondary | ICD-10-CM

## 2021-12-05 DIAGNOSIS — G8929 Other chronic pain: Secondary | ICD-10-CM | POA: Diagnosis not present

## 2021-12-05 DIAGNOSIS — M25562 Pain in left knee: Secondary | ICD-10-CM

## 2021-12-05 DIAGNOSIS — R2681 Unsteadiness on feet: Secondary | ICD-10-CM

## 2021-12-05 DIAGNOSIS — M25662 Stiffness of left knee, not elsewhere classified: Secondary | ICD-10-CM | POA: Diagnosis not present

## 2021-12-05 DIAGNOSIS — R2689 Other abnormalities of gait and mobility: Secondary | ICD-10-CM | POA: Diagnosis not present

## 2021-12-05 NOTE — Therapy (Signed)
OUTPATIENT PHYSICAL THERAPY TREATMENT    Patient Name: Toni Medina MRN: [id number] DOB:[birth year], 88 y.o., female Today's Date: 12/05/2021  PCP: Seward Carol, MD  REFERRING PROVIDER: Mcarthur Rossetti, MD   END OF SESSION:    PT End of Session - 12/05/21 1300     Visit Number 7    Number of Visits 16    Date for PT Re-Evaluation 01/09/22    Authorization Type Medicare/BCBS    Progress Note Due on Visit 10    PT Start Time 1300    PT Stop Time 1342    PT Time Calculation (min) 42 min    Activity Tolerance Patient tolerated treatment well;No increased pain    Behavior During Therapy WFL for tasks assessed/performed                 Past Medical History:  Diagnosis Date   Arthritis    Hypothyroidism    Past Surgical History:  Procedure Laterality Date   BREAST EXCISIONAL BIOPSY Left 2014   COLONOSCOPY     MENISCUS REPAIR     TOTAL KNEE ARTHROPLASTY Left 11/01/2021   Procedure: LEFT TOTAL KNEE ARTHROPLASTY;  Surgeon: Mcarthur Rossetti, MD;  Location: [facility];  Service: Orthopedics;  Laterality: Left;   Patient Active Problem List   Diagnosis Date Noted   Status post total left knee replacement 11/01/2021   Unilateral primary osteoarthritis, left knee 09/10/2020    REFERRING DIAG: M17.12 (ICD-10-CM) - Unilateral primary osteoarthritis, left knee Z96.652 (ICD-10-CM) - Status post total left knee replacement   THERAPY DIAG:  Chronic pain of left knee  Unsteadiness on feet  Other abnormalities of gait and mobility  Localized edema  Stiffness of left knee, not elsewhere classified  Rationale for Evaluation and Treatment Rehabilitation  ONSET DATE: 11/01/21   SUBJECTIVE:   SUBJECTIVE STATEMENT: "Not too much pain, knee is stiff however"  PERTINENT HISTORY: hypothyroidism  PAIN:  NPRS scale: 2/10 Pain location: Lt knee Pain description: aching Aggravating factors: exercises/bending knee, movement, static positioning for too  long Relieving factors: meds, rest, ice  PRECAUTIONS: None  WEIGHT BEARING RESTRICTIONS No  FALLS:  Has patient fallen in last 6 months? No  LIVING ENVIRONMENT: Lives with: lives alone and daughter/son providing 24 hour care for the next few weeks Lives in: House/apartment Stairs: Yes: External: 2 steps; no railings, holds onto door jam; has 8 steps with handrail as well Has following equipment at home: Single point cane, Walker - 2 wheeled, and Electronics engineer  OCCUPATION: retired: Personnel officer  PLOF: Independent and Leisure: cooking and baking, reading, no regular exercise  PATIENT GOALS regain regular function, walk without a cane   OBJECTIVE:   PATIENT SURVEYS:  11/14/21: FOTO 38 (predicted 52)  COGNITION: 11/14/21 Overall cognitive status: Within functional limits for tasks assessed     SENSATION: 11/14/21 [facility]  EDEMA:  11/14/21 Circumferential: Joint Line: Rt: 37 cm /Lt: 39.5 cm   POSTURE:  11/14/21 rounded shoulders, forward head, and flexed trunk   LOWER EXTREMITY ROM:  Active ROM Right Eval 11/14/21 Left Eval 11/14/21 Left 11/20/21 Left 11/28/21 Left 12/03/21  Knee flexion 121 85 100 AAROM with strap supine 112 117  Knee extension -2 (seated LAQ) -18 (seated LAQ)  -7 (seated LAQ) -7 (seated LAQ)   (Blank rows = not tested)  Passive ROM Right Eval 11/14/21 Left Eval 11/14/21  Knee flexion  92  Knee extension  -1   (Blank rows = not tested)  LOWER EXTREMITY MMT:  MMT Right Eval 11/14/21 Left Eval 11/14/21 Left 11/26/2021  Knee flexion  3-/5 5/5  Knee extension  3-/5 4/5   (Blank rows = not tested)   GAIT: 11/14/21: Distance walked: 100' in clinic Assistive device utilized: Single point cane Level of assistance: Modified independence Comments: decreased stance on Lt, decreased hip/knee flexion on Lt, flexed trunk    TODAY'S TREATMENT: 12/05/21 Therex: Recumbent bike x 5 min; seat 5; L1 Leg press DL 50# 2X15, then SL 25# X  15 bilat Forward step up x 10 reps bil onto 6" step with one UE support Toe tap with 6 inch step with light UE support x 10 reps bil Seated SLR 2 x10 reps bil Calf raises x20 reps Hip extension standing x 20 reps bil Hip abduction standing x 20 reps bil Seated knee flexion AAROM 5 sec X 10 bilat Seated LAQ 2x10; 3# Seated hamstring stretch bilat 3 X 30 sec  12/03/21 Therex: Recumbent bike x 5 min; seat 4; L1 Forward step up x 10 reps bil onto 4" step Heel taps on 4" step with light UE support x 10 reps bil Seated SLR x10 reps bil Calf raises x20 reps Hip extension standing x 20 reps bil Hip abduction standing x 20 reps bil Seated LAQ 2x10; 3# AROM measurements - see above Supine hamstring stretch with overpressure at distal thigh 2x30 sec; Lt Bridges x 10 reps; 5 sec hold     PATIENT EDUCATION:  11/14/21 Education details: HEP Person educated: Patient and Child(ren) Education method: Explanation, Demonstration, and Handouts Education comprehension: verbalized understanding, returned demonstration, and needs further education   HOME EXERCISE PROGRAM: Access Code: [email or web address] URL: [email or web address] Date: 12/03/2021 Prepared by: Faustino Congress  Exercises - Long Sitting Quad Set  - 5-10 x daily - 7 x weekly - 1 sets - 5-10 reps - 5 sec hold - Supine Heel Slide with Strap  - 5-10 x daily - 7 x weekly - 1 sets - 5-10 reps - Seated Knee Extension AROM  - 3-5 x daily - 7 x weekly - 1 sets - 10 reps - 3-5 sec hold - Seated Knee Flexion AAROM  - 3-5 x daily - 7 x weekly - 1 sets - 10 reps - 5-10 sec hold - Supine Knee Extension Mobilization with Weight  - 2-3 x daily - 7 x weekly - 1 sets - 1 reps - 3-5 min hold - Forward Step Up with Counter Support  - 1 x daily - 7 x weekly - 1 sets - 10 reps - Lateral Step Down  - 1 x daily - 7 x weekly - 1 sets - 10 reps - Seated Straight Leg Raise  - 1 x daily - 7 x weekly - 1 sets - 10 reps - Heel Raises with Counter  Support  - 1 x daily - 7 x weekly - 1 sets - 20 reps - Standing Hip Extension with Counter Support  - 1 x daily - 7 x weekly - 1 sets - 20 reps - Standing Hip Abduction with Counter Support  - 1 x daily - 7 x weekly - 1 sets - 20 reps  ASSESSMENT:  CLINICAL IMPRESSION: She is doing well with pain management and had good tolerance to gradual strength progressions today. PT recommending to continue current plan.   OBJECTIVE IMPAIRMENTS Abnormal gait, decreased activity tolerance, decreased balance, decreased mobility, difficulty walking, decreased ROM, decreased strength, increased edema, increased fascial restrictions, impaired flexibility, postural dysfunction, and pain.   ACTIVITY  LIMITATIONS carrying, lifting, bending, sitting, standing, squatting, sleeping, stairs, transfers, bed mobility, and locomotion level  PARTICIPATION LIMITATIONS: meal prep, cleaning, laundry, driving, shopping, community activity, and occupation  PERSONAL FACTORS 1 comorbidity: hypothyroidism  are also affecting patient's functional outcome.   REHAB POTENTIAL: Good  CLINICAL DECISION MAKING: Stable/uncomplicated  EVALUATION COMPLEXITY: Low   GOALS: Goals reviewed with patient? Yes  SHORT TERM GOALS: Target date: 12/12/2021  Independent with initial HEP Goal status:on going - assessed 11/18/2021  2.  Lt knee AROM 0-10-100 for improved function. Goal status:MET 11/28/21  LONG TERM GOALS: Target date: 01/09/2022  Independent with final HEP Goal status: INITIAL  2.  FOTO score improved to 52 Goal status: INITIAL  3.  Lt knee AROM improved to 0-110 for improved mobility and function Goal status: INIITAL  4.  Report pain < 2/10 with standing/walking activities for improved function Goal status: INITIAL  5.  Amb without AD without significant deviations for improved function. Goal status: INITIAL    PLAN: PT FREQUENCY: 2x/week  PT DURATION: 8 weeks  PLANNED INTERVENTIONS: Therapeutic  exercises, Therapeutic activity, Neuromuscular re-education, Balance training, Gait training, Patient/Family education, Self Care, Joint mobilization, Stair training, DME instructions, Aquatic Therapy, Dry Needling, Electrical stimulation, Cryotherapy, Moist heat, scar mobilization, Taping, Vasopneumatic device, Ultrasound, Ionotophoresis 18m/ml Dexamethasone, Manual therapy, and Re-evaluation  PLAN FOR NEXT SESSION:   check updated HEP, Bilateral LE strengthening, stairs/step ups, Static/dynamic balance.   BElsie Ra PT, DPT 12/05/21 1:35 PM

## 2021-12-10 ENCOUNTER — Ambulatory Visit (INDEPENDENT_AMBULATORY_CARE_PROVIDER_SITE_OTHER): Payer: Medicare Other | Admitting: Physical Therapy

## 2021-12-10 ENCOUNTER — Encounter: Payer: Self-pay | Admitting: Physical Therapy

## 2021-12-10 DIAGNOSIS — R2689 Other abnormalities of gait and mobility: Secondary | ICD-10-CM

## 2021-12-10 DIAGNOSIS — R600 Localized edema: Secondary | ICD-10-CM | POA: Diagnosis not present

## 2021-12-10 DIAGNOSIS — M25662 Stiffness of left knee, not elsewhere classified: Secondary | ICD-10-CM | POA: Diagnosis not present

## 2021-12-10 DIAGNOSIS — R2681 Unsteadiness on feet: Secondary | ICD-10-CM | POA: Diagnosis not present

## 2021-12-10 DIAGNOSIS — M6281 Muscle weakness (generalized): Secondary | ICD-10-CM

## 2021-12-10 DIAGNOSIS — G8929 Other chronic pain: Secondary | ICD-10-CM

## 2021-12-10 DIAGNOSIS — M25562 Pain in left knee: Secondary | ICD-10-CM | POA: Diagnosis not present

## 2021-12-10 NOTE — Therapy (Signed)
OUTPATIENT PHYSICAL THERAPY TREATMENT    Patient Name: Toni Medina MRN: [id number] DOB:[birth year], 88 y.o., female Today's Date: 12/10/2021  PCP: Seward Carol, MD  REFERRING PROVIDER: Mcarthur Rossetti, MD   END OF SESSION:    PT End of Session - 12/10/21 1340     Visit Number 8    Number of Visits 16    Date for PT Re-Evaluation 01/09/22    Authorization Type Medicare/BCBS    Progress Note Due on Visit 10    PT Start Time 1300    PT Stop Time 4098    PT Time Calculation (min) 38 min    Activity Tolerance Patient tolerated treatment well;No increased pain    Behavior During Therapy WFL for tasks assessed/performed                  Past Medical History:  Diagnosis Date   Arthritis    Hypothyroidism    Past Surgical History:  Procedure Laterality Date   BREAST EXCISIONAL BIOPSY Left 2014   COLONOSCOPY     MENISCUS REPAIR     TOTAL KNEE ARTHROPLASTY Left 11/01/2021   Procedure: LEFT TOTAL KNEE ARTHROPLASTY;  Surgeon: Mcarthur Rossetti, MD;  Location: [facility];  Service: Orthopedics;  Laterality: Left;   Patient Active Problem List   Diagnosis Date Noted   Status post total left knee replacement 11/01/2021   Unilateral primary osteoarthritis, left knee 09/10/2020    REFERRING DIAG: M17.12 (ICD-10-CM) - Unilateral primary osteoarthritis, left knee Z96.652 (ICD-10-CM) - Status post total left knee replacement   THERAPY DIAG:  Chronic pain of left knee  Other abnormalities of gait and mobility  Unsteadiness on feet  Localized edema  Stiffness of left knee, not elsewhere classified  Muscle weakness (generalized)  Rationale for Evaluation and Treatment Rehabilitation  ONSET DATE: 11/01/21   SUBJECTIVE:   SUBJECTIVE STATEMENT: Doing pretty well, knee is doing well.  Knee has loosened up some.  PERTINENT HISTORY: hypothyroidism  PAIN:  NPRS scale: "not yet"/10 Pain location: Lt knee Pain description: aching Aggravating  factors: exercises/bending knee, movement, static positioning for too long Relieving factors: meds, rest, ice  PRECAUTIONS: None  WEIGHT BEARING RESTRICTIONS No  FALLS:  Has patient fallen in last 6 months? No  LIVING ENVIRONMENT: Lives with: lives alone and daughter/son providing 24 hour care for the next few weeks Lives in: House/apartment Stairs: Yes: External: 2 steps; no railings, holds onto door jam; has 8 steps with handrail as well Has following equipment at home: Single point cane, Walker - 2 wheeled, and Electronics engineer  OCCUPATION: retired: Personnel officer  PLOF: Independent and Leisure: cooking and baking, reading, no regular exercise  PATIENT GOALS regain regular function, walk without a cane   OBJECTIVE:   PATIENT SURVEYS:  11/14/21: FOTO 38 (predicted 52)  COGNITION: 11/14/21 Overall cognitive status: Within functional limits for tasks assessed     SENSATION: 11/14/21 [facility]  EDEMA:  11/14/21 Circumferential: Joint Line: Rt: 37 cm /Lt: 39.5 cm   POSTURE:  11/14/21 rounded shoulders, forward head, and flexed trunk   LOWER EXTREMITY ROM:  Active ROM Right Eval 11/14/21 Left Eval 11/14/21 Left 11/20/21 Left 11/28/21 Left 12/03/21  Knee flexion 121 85 100 AAROM with strap supine 112 117  Knee extension -2 (seated LAQ) -18 (seated LAQ)  -7 (seated LAQ) -7 (seated LAQ)   (Blank rows = not tested)  Passive ROM Right Eval 11/14/21 Left Eval 11/14/21  Knee flexion  92  Knee extension  -1   (  Blank rows = not tested)  LOWER EXTREMITY MMT:  MMT Right Eval 11/14/21 Left Eval 11/14/21 Left 11/26/2021  Knee flexion  3-/5 5/5  Knee extension  3-/5 4/5   (Blank rows = not tested)   GAIT: 11/14/21: Distance walked: 100' in clinic Assistive device utilized: Single point cane Level of assistance: Modified independence Comments: decreased stance on Lt, decreased hip/knee flexion on Lt, flexed trunk    TODAY'S  TREATMENT: 12/10/21 Therex: Recumbent bike x 5 min; seat 4; L3 Leg press DL 50# 2X15, then SL 25# X 15 bilat Forward step ups onto 6" step 2x10  Calf raises x 20 reps Knee extension single limb 5# 2x10; bil Knee flexion single limb 10# 2x10 bil Sit to/from stand x 10 reps without UE support Seated SLR x 10 reps bil  Neuro Re-Ed On compliant surface: feet together with EO and horizontal head turns x 10 reps; tandem stance with close supervision x 20 sec each Trial of feet together with EC with LOB to Lt  12/05/21 Therex: Recumbent bike x 5 min; seat 5; L1 Leg press DL 50# 2X15, then SL 25# X 15 bilat Forward step up x 10 reps bil onto 6" step with one UE support Toe tap with 6 inch step with light UE support x 10 reps bil Seated SLR 2 x10 reps bil Calf raises x20 reps Hip extension standing x 20 reps bil Hip abduction standing x 20 reps bil Seated knee flexion AAROM 5 sec X 10 bilat Seated LAQ 2x10; 3# Seated hamstring stretch bilat 3 X 30 sec  12/03/21 Therex: Recumbent bike x 5 min; seat 4; L1 Forward step up x 10 reps bil onto 4" step Heel taps on 4" step with light UE support x 10 reps bil Seated SLR x10 reps bil Calf raises x20 reps Hip extension standing x 20 reps bil Hip abduction standing x 20 reps bil Seated LAQ 2x10; 3# AROM measurements - see above Supine hamstring stretch with overpressure at distal thigh 2x30 sec; Lt Bridges x 10 reps; 5 sec hold     PATIENT EDUCATION:  11/14/21 Education details: HEP Person educated: Patient and Child(ren) Education method: Explanation, Demonstration, and Handouts Education comprehension: verbalized understanding, returned demonstration, and needs further education   HOME EXERCISE PROGRAM: Access Code: [email or web address] URL: [email or web address] Date: 12/03/2021 Prepared by: Faustino Congress  Exercises - Long Sitting Quad Set  - 5-10 x daily - 7 x weekly - 1 sets - 5-10 reps - 5 sec hold - Supine Heel  Slide with Strap  - 5-10 x daily - 7 x weekly - 1 sets - 5-10 reps - Seated Knee Extension AROM  - 3-5 x daily - 7 x weekly - 1 sets - 10 reps - 3-5 sec hold - Seated Knee Flexion AAROM  - 3-5 x daily - 7 x weekly - 1 sets - 10 reps - 5-10 sec hold - Supine Knee Extension Mobilization with Weight  - 2-3 x daily - 7 x weekly - 1 sets - 1 reps - 3-5 min hold - Forward Step Up with Counter Support  - 1 x daily - 7 x weekly - 1 sets - 10 reps - Lateral Step Down  - 1 x daily - 7 x weekly - 1 sets - 10 reps - Seated Straight Leg Raise  - 1 x daily - 7 x weekly - 1 sets - 10 reps - Heel Raises with Counter Support  - 1 x daily - 7 x weekly -  1 sets - 20 reps - Standing Hip Extension with Counter Support  - 1 x daily - 7 x weekly - 1 sets - 20 reps - Standing Hip Abduction with Counter Support  - 1 x daily - 7 x weekly - 1 sets - 20 reps - Tandem Stance on Foam Pad with Eyes Open  - 1 x daily - 7 x weekly - 1 sets - 2-3 reps - 15-30 sec hold  ASSESSMENT:  CLINICAL IMPRESSION: Pt tolerated session well with continued focus on strengthening and balance.  Added one balance exercise to HEP today.  Will continue to benefit from PT to maximize function.   OBJECTIVE IMPAIRMENTS Abnormal gait, decreased activity tolerance, decreased balance, decreased mobility, difficulty walking, decreased ROM, decreased strength, increased edema, increased fascial restrictions, impaired flexibility, postural dysfunction, and pain.   ACTIVITY LIMITATIONS carrying, lifting, bending, sitting, standing, squatting, sleeping, stairs, transfers, bed mobility, and locomotion level  PARTICIPATION LIMITATIONS: meal prep, cleaning, laundry, driving, shopping, community activity, and occupation  PERSONAL FACTORS 1 comorbidity: hypothyroidism  are also affecting patient's functional outcome.   REHAB POTENTIAL: Good  CLINICAL DECISION MAKING: Stable/uncomplicated  EVALUATION COMPLEXITY: Low   GOALS: Goals reviewed with  patient? Yes  SHORT TERM GOALS: Target date: 12/12/2021  Independent with initial HEP Goal status:on going - assessed 11/18/2021  2.  Lt knee AROM 0-10-100 for improved function. Goal status:MET 11/28/21  LONG TERM GOALS: Target date: 01/09/2022  Independent with final HEP Goal status: INITIAL  2.  FOTO score improved to 52 Goal status: INITIAL  3.  Lt knee AROM improved to 0-110 for improved mobility and function Goal status: INIITAL  4.  Report pain < 2/10 with standing/walking activities for improved function Goal status: INITIAL  5.  Amb without AD without significant deviations for improved function. Goal status: INITIAL    PLAN: PT FREQUENCY: 2x/week  PT DURATION: 8 weeks  PLANNED INTERVENTIONS: Therapeutic exercises, Therapeutic activity, Neuromuscular re-education, Balance training, Gait training, Patient/Family education, Self Care, Joint mobilization, Stair training, DME instructions, Aquatic Therapy, Dry Needling, Electrical stimulation, Cryotherapy, Moist heat, scar mobilization, Taping, Vasopneumatic device, Ultrasound, Ionotophoresis 25m/ml Dexamethasone, Manual therapy, and Re-evaluation  PLAN FOR NEXT SESSION:   needs MD note,  Bilateral LE strengthening, stairs/step ups, Static/dynamic balance.     SLaureen Abrahams PT, DPT 12/10/21 1:41 PM

## 2021-12-12 ENCOUNTER — Encounter: Payer: Self-pay | Admitting: Physical Therapy

## 2021-12-12 ENCOUNTER — Encounter: Payer: Self-pay | Admitting: Orthopaedic Surgery

## 2021-12-12 ENCOUNTER — Ambulatory Visit (INDEPENDENT_AMBULATORY_CARE_PROVIDER_SITE_OTHER): Payer: Medicare Other | Admitting: Physical Therapy

## 2021-12-12 ENCOUNTER — Ambulatory Visit (INDEPENDENT_AMBULATORY_CARE_PROVIDER_SITE_OTHER): Payer: Medicare Other | Admitting: Orthopaedic Surgery

## 2021-12-12 DIAGNOSIS — R2681 Unsteadiness on feet: Secondary | ICD-10-CM | POA: Diagnosis not present

## 2021-12-12 DIAGNOSIS — R2689 Other abnormalities of gait and mobility: Secondary | ICD-10-CM | POA: Diagnosis not present

## 2021-12-12 DIAGNOSIS — Z96652 Presence of left artificial knee joint: Secondary | ICD-10-CM

## 2021-12-12 DIAGNOSIS — G8929 Other chronic pain: Secondary | ICD-10-CM | POA: Diagnosis not present

## 2021-12-12 DIAGNOSIS — R600 Localized edema: Secondary | ICD-10-CM | POA: Diagnosis not present

## 2021-12-12 DIAGNOSIS — M6281 Muscle weakness (generalized): Secondary | ICD-10-CM

## 2021-12-12 DIAGNOSIS — M25662 Stiffness of left knee, not elsewhere classified: Secondary | ICD-10-CM

## 2021-12-12 DIAGNOSIS — M25562 Pain in left knee: Secondary | ICD-10-CM | POA: Diagnosis not present

## 2021-12-12 NOTE — Progress Notes (Signed)
The patient is an 88 year old female who is now 6 weeks status post a left total knee arthroplasty.  She is doing great.  She has made excellent progress from physical therapy.  She takes an occasional meloxicam and Tylenol for pain.  Examination of her left knee still shows some swelling to be expected.  The knee is nice and straight where her right knee has significant valgus malalignment.  Preoperatively she had valgus malalignment of this knee.  Her range of motion is almost entirely full at this point and her knee feels ligamentously stable.  She is pleased overall.  From my standpoint I do not need to see her for 3 months unless there are issues.  At that visit we will have a standing AP and lateral of her left operative knee.

## 2021-12-12 NOTE — Therapy (Signed)
OUTPATIENT PHYSICAL THERAPY TREATMENT Progress Note reporting period 11/14/21 to 12/12/21  See below for objective and subjective measurements relating to patients progress with PT.     Patient Name: Toni Medina MRN: [id number] DOB:[birth year], 88 y.o., female Today's Date: 12/12/2021  PCP: Seward Carol, MD  REFERRING PROVIDER: Mcarthur Rossetti, MD   END OF SESSION:    PT End of Session - 12/12/21 1429     Visit Number 9    Number of Visits 16    Date for PT Re-Evaluation 01/09/22    Authorization Type Medicare/BCBS    Progress Note Due on Visit 19    PT Start Time 1430    PT Stop Time 1515    PT Time Calculation (min) 45 min    Activity Tolerance Patient tolerated treatment well;No increased pain    Behavior During Therapy WFL for tasks assessed/performed                  Past Medical History:  Diagnosis Date   Arthritis    Hypothyroidism    Past Surgical History:  Procedure Laterality Date   BREAST EXCISIONAL BIOPSY Left 2014   COLONOSCOPY     MENISCUS REPAIR     TOTAL KNEE ARTHROPLASTY Left 11/01/2021   Procedure: LEFT TOTAL KNEE ARTHROPLASTY;  Surgeon: Mcarthur Rossetti, MD;  Location: [facility];  Service: Orthopedics;  Laterality: Left;   Patient Active Problem List   Diagnosis Date Noted   Status post total left knee replacement 11/01/2021   Unilateral primary osteoarthritis, left knee 09/10/2020    REFERRING DIAG: M17.12 (ICD-10-CM) - Unilateral primary osteoarthritis, left knee Z96.652 (ICD-10-CM) - Status post total left knee replacement   THERAPY DIAG:  Chronic pain of left knee  Other abnormalities of gait and mobility  Unsteadiness on feet  Localized edema  Stiffness of left knee, not elsewhere classified  Muscle weakness (generalized)  Rationale for Evaluation and Treatment Rehabilitation  ONSET DATE: 11/01/21   SUBJECTIVE:   SUBJECTIVE STATEMENT: Doing well with pain, 2/10 today  PERTINENT  HISTORY: hypothyroidism  PAIN:  NPRS scale: 2/10 Pain location: Lt knee Pain description: aching Aggravating factors: exercises/bending knee, movement, static positioning for too long Relieving factors: meds, rest, ice  PRECAUTIONS: None  WEIGHT BEARING RESTRICTIONS No  FALLS:  Has patient fallen in last 6 months? No  LIVING ENVIRONMENT: Lives with: lives alone and daughter/son providing 24 hour care for the next few weeks Lives in: House/apartment Stairs: Yes: External: 2 steps; no railings, holds onto door jam; has 8 steps with handrail as well Has following equipment at home: Single point cane, Walker - 2 wheeled, and Electronics engineer  OCCUPATION: retired: Personnel officer  PLOF: Independent and Leisure: cooking and baking, reading, no regular exercise  PATIENT GOALS regain regular function, walk without a cane   OBJECTIVE:   PATIENT SURVEYS:  11/14/21: FOTO 38 (predicted 52)  COGNITION: 11/14/21 Overall cognitive status: Within functional limits for tasks assessed     SENSATION: 11/14/21 [facility]  EDEMA:  11/14/21 Circumferential: Joint Line: Rt: 37 cm /Lt: 39.5 cm   POSTURE:  11/14/21 rounded shoulders, forward head, and flexed trunk   LOWER EXTREMITY ROM:  Active ROM Right Eval 11/14/21 Left Eval 11/14/21 Left 11/20/21 Left 11/28/21 Left 12/03/21 Left 12/12/21  Knee flexion 121 85 100 AAROM with strap supine 112 117 120   Knee extension -2 (seated LAQ) -18 (seated LAQ)  -7 (seated LAQ) -7 (seated LAQ) -5   (Blank rows = not tested)  Passive ROM Right Eval 11/14/21 Left Eval 11/14/21  Knee flexion  92  Knee extension  -1   (Blank rows = not tested)  LOWER EXTREMITY MMT:  MMT Right Eval 11/14/21 Left Eval 11/14/21 Left 11/26/2021 Left 12/12/21  Knee flexion  3-/5 5/5 5/5  Knee extension  3-/5 4/5 4+/5   (Blank rows = not tested)   GAIT: 11/14/21: Distance walked: 100' in clinic Assistive device utilized: Single point cane Level of  assistance: Modified independence Comments: decreased stance on Lt, decreased hip/knee flexion on Lt, flexed trunk    TODAY'S TREATMENT: 12/12/21 Therex: Recumbent bike x 6 min; seat 4; L3-1 as she gets fatigued Leg press DL 56# 2X15, then SL 25# X 15 bilat Forward step ups onto 6" step up with left and down in front with right X 10 Knee extension single limb 5# 2x10; bil, then DL 10# X 10 Knee flexion single limb 10# 2x10 bil Sit to/from stand x 10 reps without UE support Seated SLR 2 x 10 reps bil  Neuro Re-Ed On compliant surface: feet together with EO and horizontal head turns x 10 reps and head nods X 10; tandem stance with close supervision x 20 sec each Foam eyes closed feet apart 10 sec X 5   PATIENT EDUCATION:  11/14/21 Education details: HEP Person educated: Patient and Child(ren) Education method: Explanation, Demonstration, and Handouts Education comprehension: verbalized understanding, returned demonstration, and needs further education   HOME EXERCISE PROGRAM: Access Code: [email or web address] URL: [email or web address] Date: 12/03/2021 Prepared by: Faustino Congress  Exercises - Long Sitting Quad Set  - 5-10 x daily - 7 x weekly - 1 sets - 5-10 reps - 5 sec hold - Supine Heel Slide with Strap  - 5-10 x daily - 7 x weekly - 1 sets - 5-10 reps - Seated Knee Extension AROM  - 3-5 x daily - 7 x weekly - 1 sets - 10 reps - 3-5 sec hold - Seated Knee Flexion AAROM  - 3-5 x daily - 7 x weekly - 1 sets - 10 reps - 5-10 sec hold - Supine Knee Extension Mobilization with Weight  - 2-3 x daily - 7 x weekly - 1 sets - 1 reps - 3-5 min hold - Forward Step Up with Counter Support  - 1 x daily - 7 x weekly - 1 sets - 10 reps - Lateral Step Down  - 1 x daily - 7 x weekly - 1 sets - 10 reps - Seated Straight Leg Raise  - 1 x daily - 7 x weekly - 1 sets - 10 reps - Heel Raises with Counter Support  - 1 x daily - 7 x weekly - 1 sets - 20 reps - Standing Hip Extension with  Counter Support  - 1 x daily - 7 x weekly - 1 sets - 20 reps - Standing Hip Abduction with Counter Support  - 1 x daily - 7 x weekly - 1 sets - 20 reps - Tandem Stance on Foam Pad with Eyes Open  - 1 x daily - 7 x weekly - 1 sets - 2-3 reps - 15-30 sec hold  ASSESSMENT:  CLINICAL IMPRESSION: Progress note reflects she is making good progress overall with PT. She has good knee ROM now but does still miss some overall quad strength thus PT recommending up to another 4 weeks to maximize her strength and function.    OBJECTIVE IMPAIRMENTS Abnormal gait, decreased activity tolerance, decreased balance, decreased mobility, difficulty walking, decreased  ROM, decreased strength, increased edema, increased fascial restrictions, impaired flexibility, postural dysfunction, and pain.   ACTIVITY LIMITATIONS carrying, lifting, bending, sitting, standing, squatting, sleeping, stairs, transfers, bed mobility, and locomotion level  PARTICIPATION LIMITATIONS: meal prep, cleaning, laundry, driving, shopping, community activity, and occupation  PERSONAL FACTORS 1 comorbidity: hypothyroidism  are also affecting patient's functional outcome.   REHAB POTENTIAL: Good  CLINICAL DECISION MAKING: Stable/uncomplicated  EVALUATION COMPLEXITY: Low   GOALS: Goals reviewed with patient? Yes  SHORT TERM GOALS: Target date: 12/12/2021  Independent with initial HEP Goal status:on going - assessed 11/18/2021  2.  Lt knee AROM 0-10-100 for improved function. Goal status:MET 11/28/21  LONG TERM GOALS: Target date: 01/09/2022  Independent with final HEP Goal status: INITIAL  2.  FOTO score improved to 52 Goal status: INITIAL  3.  Lt knee AROM improved to 0-110 for improved mobility and function Goal status: INIITAL  4.  Report pain < 2/10 with standing/walking activities for improved function Goal status: INITIAL  5.  Amb without AD without significant deviations for improved function. Goal status:  INITIAL    PLAN: PT FREQUENCY: 2x/week  PT DURATION: 8 weeks  PLANNED INTERVENTIONS: Therapeutic exercises, Therapeutic activity, Neuromuscular re-education, Balance training, Gait training, Patient/Family education, Self Care, Joint mobilization, Stair training, DME instructions, Aquatic Therapy, Dry Needling, Electrical stimulation, Cryotherapy, Moist heat, scar mobilization, Taping, Vasopneumatic device, Ultrasound, Ionotophoresis 42m/ml Dexamethasone, Manual therapy, and Re-evaluation  PLAN FOR NEXT SESSION:  what did MD say?   SLaureen Abrahams PT, DPT 12/12/21 3:18 PM

## 2021-12-16 DIAGNOSIS — E7800 Pure hypercholesterolemia, unspecified: Secondary | ICD-10-CM | POA: Diagnosis not present

## 2021-12-16 DIAGNOSIS — E039 Hypothyroidism, unspecified: Secondary | ICD-10-CM | POA: Diagnosis not present

## 2021-12-16 DIAGNOSIS — M545 Low back pain, unspecified: Secondary | ICD-10-CM | POA: Diagnosis not present

## 2021-12-16 DIAGNOSIS — Z0000 Encounter for general adult medical examination without abnormal findings: Secondary | ICD-10-CM | POA: Diagnosis not present

## 2021-12-17 ENCOUNTER — Encounter: Payer: Self-pay | Admitting: Physical Therapy

## 2021-12-17 ENCOUNTER — Ambulatory Visit (INDEPENDENT_AMBULATORY_CARE_PROVIDER_SITE_OTHER): Payer: Medicare Other | Admitting: Physical Therapy

## 2021-12-17 DIAGNOSIS — G8929 Other chronic pain: Secondary | ICD-10-CM | POA: Diagnosis not present

## 2021-12-17 DIAGNOSIS — R2689 Other abnormalities of gait and mobility: Secondary | ICD-10-CM

## 2021-12-17 DIAGNOSIS — M25562 Pain in left knee: Secondary | ICD-10-CM | POA: Diagnosis not present

## 2021-12-17 DIAGNOSIS — R600 Localized edema: Secondary | ICD-10-CM | POA: Diagnosis not present

## 2021-12-17 DIAGNOSIS — M25662 Stiffness of left knee, not elsewhere classified: Secondary | ICD-10-CM

## 2021-12-17 DIAGNOSIS — M6281 Muscle weakness (generalized): Secondary | ICD-10-CM

## 2021-12-17 DIAGNOSIS — R2681 Unsteadiness on feet: Secondary | ICD-10-CM | POA: Diagnosis not present

## 2021-12-17 NOTE — Therapy (Signed)
OUTPATIENT PHYSICAL THERAPY TREATMENT     Patient Name: Toni Medina MRN: [id number] DOB:04/09/1931, 88 y.o., female Today's Date: 12/17/2021  PCP: Seward Carol, MD  REFERRING PROVIDER: Mcarthur Rossetti, MD   END OF SESSION:    PT End of Session - 12/17/21 1303     Visit Number 10    Number of Visits 16    Date for PT Re-Evaluation 01/09/22    Authorization Type Medicare/BCBS    Progress Note Due on Visit 19    PT Start Time 1300    PT Stop Time 1339    PT Time Calculation (min) 39 min    Activity Tolerance Patient tolerated treatment well;No increased pain    Behavior During Therapy WFL for tasks assessed/performed                   Past Medical History:  Diagnosis Date   Arthritis    Hypothyroidism    Past Surgical History:  Procedure Laterality Date   BREAST EXCISIONAL BIOPSY Left 2014   COLONOSCOPY     MENISCUS REPAIR     TOTAL KNEE ARTHROPLASTY Left 11/01/2021   Procedure: LEFT TOTAL KNEE ARTHROPLASTY;  Surgeon: Mcarthur Rossetti, MD;  Location: [facility];  Service: Orthopedics;  Laterality: Left;   Patient Active Problem List   Diagnosis Date Noted   Status post total left knee replacement 11/01/2021   Unilateral primary osteoarthritis, left knee 09/10/2020    REFERRING DIAG: M17.12 (ICD-10-CM) - Unilateral primary osteoarthritis, left knee Z96.652 (ICD-10-CM) - Status post total left knee replacement   THERAPY DIAG:  Chronic pain of left knee  Other abnormalities of gait and mobility  Localized edema  Stiffness of left knee, not elsewhere classified  Muscle weakness (generalized)  Unsteadiness on feet  Rationale for Evaluation and Treatment Rehabilitation  ONSET DATE: 11/01/21   SUBJECTIVE:   SUBJECTIVE STATEMENT: No complaints; Rt leg feels weaker than her Lt at this time   PERTINENT HISTORY: hypothyroidism  PAIN:  NPRS scale: 2/10 Pain location: Lt knee Pain description: aching Aggravating factors:  exercises/bending knee, movement, static positioning for too long Relieving factors: meds, rest, ice  PRECAUTIONS: None  WEIGHT BEARING RESTRICTIONS No  FALLS:  Has patient fallen in last 6 months? No  LIVING ENVIRONMENT: Lives with: lives alone and daughter/son providing 24 hour care for the next few weeks Lives in: House/apartment Stairs: Yes: External: 2 steps; no railings, holds onto door jam; has 8 steps with handrail as well Has following equipment at home: Single point cane, Walker - 2 wheeled, and Electronics engineer  OCCUPATION: retired: Personnel officer  PLOF: Independent and Leisure: cooking and baking, reading, no regular exercise  PATIENT GOALS regain regular function, walk without a cane   OBJECTIVE:   PATIENT SURVEYS:  11/14/21: FOTO 38 (predicted 52)  COGNITION: 11/14/21 Overall cognitive status: Within functional limits for tasks assessed     SENSATION: 11/14/21 [facility]  EDEMA:  11/14/21 Circumferential: Joint Line: Rt: 37 cm /Lt: 39.5 cm   POSTURE:  11/14/21 rounded shoulders, forward head, and flexed trunk   LOWER EXTREMITY ROM:  Active ROM Right Eval 11/14/21 Left Eval 11/14/21 Left 11/20/21 Left 11/28/21 Left 12/03/21 Left 12/12/21  Knee flexion 121 85 100 AAROM with strap supine 112 117 120   Knee extension -2 (seated LAQ) -18 (seated LAQ)  -7 (seated LAQ) -7 (seated LAQ) -5   (Blank rows = not tested)  Passive ROM Right Eval 11/14/21 Left Eval 11/14/21  Knee flexion  92  Knee extension  -1   (Blank rows = not tested)  LOWER EXTREMITY MMT:  MMT Right Eval 11/14/21 Left Eval 11/14/21 Left 11/26/2021 Left 12/12/21  Knee flexion  3-/5 5/5 5/5  Knee extension  3-/5 4/5 4+/5   (Blank rows = not tested)   GAIT: 11/14/21: Distance walked: 100' in clinic Assistive device utilized: Single point cane Level of assistance: Modified independence Comments: decreased stance on Lt, decreased hip/knee flexion on Lt, flexed  trunk    TODAY'S TREATMENT: 12/17/21 Therex: [employer] recumbent bike x 8 min; seat 8; L4 Knee extension single limb 5# 2x10 bil (limited range on Rt), then DL 10# 2x10 Knee flexion bil 15# 2x10 Leg press DL 56# 2X15, then SL 25# X 15 bilat Slantboard stretch 3x30 sec Forward step ups onto 6" step with 1 UE support x 10 reps bil  Neuro Re-Ed SLS with light UE support: taps to 5 cones x 10 reps each Tandem stance with 1 finger support 2x30 sec bil   12/12/21 Therex: Recumbent bike x 6 min; seat 4; L3-1 as she gets fatigued Leg press DL 56# 2X15, then SL 25# X 15 bilat Forward step ups onto 6" step up with left and down in front with right X 10 Knee extension single limb 5# 2x10; bil, then DL 10# X 10 Knee flexion single limb 10# 2x10 bil Sit to/from stand x 10 reps without UE support Seated SLR 2 x 10 reps bil  Neuro Re-Ed On compliant surface: feet together with EO and horizontal head turns x 10 reps and head nods X 10; tandem stance with close supervision x 20 sec each Foam eyes closed feet apart 10 sec X 5   PATIENT EDUCATION:  11/14/21 Education details: HEP Person educated: Patient and Child(ren) Education method: Explanation, Demonstration, and Handouts Education comprehension: verbalized understanding, returned demonstration, and needs further education   HOME EXERCISE PROGRAM: Access Code: [email or web address] URL: [email or web address] Date: 12/03/2021 Prepared by: Faustino Congress  Exercises - Long Sitting Quad Set  - 5-10 x daily - 7 x weekly - 1 sets - 5-10 reps - 5 sec hold - Supine Heel Slide with Strap  - 5-10 x daily - 7 x weekly - 1 sets - 5-10 reps - Seated Knee Extension AROM  - 3-5 x daily - 7 x weekly - 1 sets - 10 reps - 3-5 sec hold - Seated Knee Flexion AAROM  - 3-5 x daily - 7 x weekly - 1 sets - 10 reps - 5-10 sec hold - Supine Knee Extension Mobilization with Weight  - 2-3 x daily - 7 x weekly - 1 sets - 1 reps - 3-5 min hold - Forward  Step Up with Counter Support  - 1 x daily - 7 x weekly - 1 sets - 10 reps - Lateral Step Down  - 1 x daily - 7 x weekly - 1 sets - 10 reps - Seated Straight Leg Raise  - 1 x daily - 7 x weekly - 1 sets - 10 reps - Heel Raises with Counter Support  - 1 x daily - 7 x weekly - 1 sets - 20 reps - Standing Hip Extension with Counter Support  - 1 x daily - 7 x weekly - 1 sets - 20 reps - Standing Hip Abduction with Counter Support  - 1 x daily - 7 x weekly - 1 sets - 20 reps - Tandem Stance on Foam Pad with Eyes Open  - 1 x daily - 7  x weekly - 1 sets - 2-3 reps - 15-30 sec hold  ASSESSMENT:  CLINICAL IMPRESSION: Pt tolerated session well with continued focus on strengthening and balance.  Will continue to benefit from PT to maximize function.  OBJECTIVE IMPAIRMENTS Abnormal gait, decreased activity tolerance, decreased balance, decreased mobility, difficulty walking, decreased ROM, decreased strength, increased edema, increased fascial restrictions, impaired flexibility, postural dysfunction, and pain.   ACTIVITY LIMITATIONS carrying, lifting, bending, sitting, standing, squatting, sleeping, stairs, transfers, bed mobility, and locomotion level  PARTICIPATION LIMITATIONS: meal prep, cleaning, laundry, driving, shopping, community activity, and occupation  PERSONAL FACTORS 1 comorbidity: hypothyroidism  are also affecting patient's functional outcome.   REHAB POTENTIAL: Good  CLINICAL DECISION MAKING: Stable/uncomplicated  EVALUATION COMPLEXITY: Low   GOALS: Goals reviewed with patient? Yes  SHORT TERM GOALS: Target date: 12/12/2021  Independent with initial HEP Goal status:MET 12/17/21  2.  Lt knee AROM 0-10-100 for improved function. Goal status:MET 11/28/21  LONG TERM GOALS: Target date: 01/09/2022  Independent with final HEP Goal status: INITIAL  2.  FOTO score improved to 52 Goal status: INITIAL  3.  Lt knee AROM improved to 0-110 for improved mobility and function Goal  status: INIITAL  4.  Report pain < 2/10 with standing/walking activities for improved function Goal status: INITIAL  5.  Amb without AD without significant deviations for improved function. Goal status: INITIAL    PLAN: PT FREQUENCY: 2x/week  PT DURATION: 8 weeks  PLANNED INTERVENTIONS: Therapeutic exercises, Therapeutic activity, Neuromuscular re-education, Balance training, Gait training, Patient/Family education, Self Care, Joint mobilization, Stair training, DME instructions, Aquatic Therapy, Dry Needling, Electrical stimulation, Cryotherapy, Moist heat, scar mobilization, Taping, Vasopneumatic device, Ultrasound, Ionotophoresis 32m/ml Dexamethasone, Manual therapy, and Re-evaluation  PLAN FOR NEXT SESSION:  continue strengthening/balance    SLaureen Abrahams PT, DPT 12/17/21 1:44 PM

## 2021-12-19 ENCOUNTER — Ambulatory Visit (INDEPENDENT_AMBULATORY_CARE_PROVIDER_SITE_OTHER): Payer: Medicare Other | Admitting: Physical Therapy

## 2021-12-19 ENCOUNTER — Encounter: Payer: Self-pay | Admitting: Physical Therapy

## 2021-12-19 DIAGNOSIS — G8929 Other chronic pain: Secondary | ICD-10-CM | POA: Diagnosis not present

## 2021-12-19 DIAGNOSIS — R2689 Other abnormalities of gait and mobility: Secondary | ICD-10-CM | POA: Diagnosis not present

## 2021-12-19 DIAGNOSIS — M25562 Pain in left knee: Secondary | ICD-10-CM

## 2021-12-19 DIAGNOSIS — R2681 Unsteadiness on feet: Secondary | ICD-10-CM | POA: Diagnosis not present

## 2021-12-19 DIAGNOSIS — M6281 Muscle weakness (generalized): Secondary | ICD-10-CM | POA: Diagnosis not present

## 2021-12-19 DIAGNOSIS — M25662 Stiffness of left knee, not elsewhere classified: Secondary | ICD-10-CM

## 2021-12-19 DIAGNOSIS — R600 Localized edema: Secondary | ICD-10-CM | POA: Diagnosis not present

## 2021-12-19 NOTE — Therapy (Signed)
OUTPATIENT PHYSICAL THERAPY TREATMENT     Patient Name: Toni Medina MRN: [id number] DOB:May 23, 1931, 88 y.o., female Today's Date: 12/19/2021  PCP: Seward Carol, MD  REFERRING PROVIDER: Mcarthur Rossetti, MD   END OF SESSION:    PT End of Session - 12/19/21 1258     Visit Number 11    Number of Visits 16    Date for PT Re-Evaluation 01/09/22    Authorization Type Medicare/BCBS    Progress Note Due on Visit 19    PT Start Time 1300    PT Stop Time 1341    PT Time Calculation (min) 41 min    Activity Tolerance Patient tolerated treatment well;No increased pain    Behavior During Therapy WFL for tasks assessed/performed                    Past Medical History:  Diagnosis Date   Arthritis    Hypothyroidism    Past Surgical History:  Procedure Laterality Date   BREAST EXCISIONAL BIOPSY Left 2014   COLONOSCOPY     MENISCUS REPAIR     TOTAL KNEE ARTHROPLASTY Left 11/01/2021   Procedure: LEFT TOTAL KNEE ARTHROPLASTY;  Surgeon: Mcarthur Rossetti, MD;  Location: [facility];  Service: Orthopedics;  Laterality: Left;   Patient Active Problem List   Diagnosis Date Noted   Status post total left knee replacement 11/01/2021   Unilateral primary osteoarthritis, left knee 09/10/2020    REFERRING DIAG: M17.12 (ICD-10-CM) - Unilateral primary osteoarthritis, left knee Z96.652 (ICD-10-CM) - Status post total left knee replacement   THERAPY DIAG:  Chronic pain of left knee  Other abnormalities of gait and mobility  Localized edema  Stiffness of left knee, not elsewhere classified  Muscle weakness (generalized)  Unsteadiness on feet  Rationale for Evaluation and Treatment Rehabilitation  ONSET DATE: 11/01/21   SUBJECTIVE:   SUBJECTIVE STATEMENT: Rt knee is giving her more trouble than her Lt knee.  PERTINENT HISTORY: hypothyroidism  PAIN:  NPRS scale: 2/10 Pain location: Lt knee Pain description: aching Aggravating factors:  exercises/bending knee, movement, static positioning for too long Relieving factors: meds, rest, ice  PRECAUTIONS: None  WEIGHT BEARING RESTRICTIONS No  FALLS:  Has patient fallen in last 6 months? No  LIVING ENVIRONMENT: Lives with: lives alone and daughter/son providing 24 hour care for the next few weeks Lives in: House/apartment Stairs: Yes: External: 2 steps; no railings, holds onto door jam; has 8 steps with handrail as well Has following equipment at home: Single point cane, Walker - 2 wheeled, and Electronics engineer  OCCUPATION: retired: Personnel officer  PLOF: Independent and Leisure: cooking and baking, reading, no regular exercise  PATIENT GOALS regain regular function, walk without a cane   OBJECTIVE:   PATIENT SURVEYS:  11/14/21: FOTO 38 (predicted 52)  COGNITION: 11/14/21 Overall cognitive status: Within functional limits for tasks assessed     SENSATION: 11/14/21 [facility]  EDEMA:  11/14/21 Circumferential: Joint Line: Rt: 37 cm /Lt: 39.5 cm   POSTURE:  11/14/21 rounded shoulders, forward head, and flexed trunk   LOWER EXTREMITY ROM:  Active ROM Right Eval 11/14/21 Left Eval 11/14/21 Left 11/20/21 Left 11/28/21 Left 12/03/21 Left 12/12/21  Knee flexion 121 85 100 AAROM with strap supine 112 117 120   Knee extension -2 (seated LAQ) -18 (seated LAQ)  -7 (seated LAQ) -7 (seated LAQ) -5   (Blank rows = not tested)  Passive ROM Right Eval 11/14/21 Left Eval 11/14/21  Knee flexion  92  Knee extension  -1   (Blank rows = not tested)  LOWER EXTREMITY MMT:  MMT Right Eval 11/14/21 Left Eval 11/14/21 Left 11/26/2021 Left 12/12/21  Knee flexion  3-/5 5/5 5/5  Knee extension  3-/5 4/5 4+/5   (Blank rows = not tested)   GAIT: 11/14/21: Distance walked: 100' in clinic Assistive device utilized: Single point cane Level of assistance: Modified independence Comments: decreased stance on Lt, decreased hip/knee flexion on Lt, flexed  trunk    TODAY'S TREATMENT: 12/19/21 Therex: Sci[employer] recumbent bike x 8 min; seat 8; L3 Leg Press single limb 31# 2x10 on Lt; 1x5, 1x7 on Rt; bil 62# 2x10 Seated LAQ with 3 sec hold 2x10 bil; 4# Trial of prone and supine quad stretch - pt prefers supine with strap performed 2x30 sec bil  12/17/21 Therex: SciFit recumbent bike x 8 min; seat 8; L4 Knee extension single limb 5# 2x10 bil (limited range on Rt), then DL 10# 2x10 Knee flexion bil 15# 2x10 Leg press DL 56# 2X15, then SL 25# X 15 bilat Slantboard stretch 3x30 sec Forward step ups onto 6" step with 1 UE support x 10 reps bil  Neuro Re-Ed SLS with light UE support: taps to 5 cones x 10 reps each Tandem stance with 1 finger support 2x30 sec bil   12/12/21 Therex: Recumbent bike x 6 min; seat 4; L3-1 as she gets fatigued Leg press DL 56# 2X15, then SL 25# X 15 bilat Forward step ups onto 6" step up with left and down in front with right X 10 Knee extension single limb 5# 2x10; bil, then DL 10# X 10 Knee flexion single limb 10# 2x10 bil Sit to/from stand x 10 reps without UE support Seated SLR 2 x 10 reps bil  Neuro Re-Ed On compliant surface: feet together with EO and horizontal head turns x 10 reps and head nods X 10; tandem stance with close supervision x 20 sec each Foam eyes closed feet apart 10 sec X 5   PATIENT EDUCATION:  11/14/21 Education details: HEP Person educated: Patient and Child(ren) Education method: Explanation, Demonstration, and Handouts Education comprehension: verbalized understanding, returned demonstration, and needs further education   HOME EXERCISE PROGRAM: Access Code: [email or web address] URL: [email or web address] Date: 12/19/2021 Prepared by: Faustino Congress  Exercises - Long Sitting Quad Set  - 5-10 x daily - 7 x weekly - 1 sets - 5-10 reps - 5 sec hold - Supine Heel Slide with Strap  - 5-10 x daily - 7 x weekly - 1 sets - 5-10 reps - Seated Knee Extension AROM  - 3-5 x  daily - 7 x weekly - 1 sets - 10 reps - 3-5 sec hold - Seated Knee Flexion AAROM  - 3-5 x daily - 7 x weekly - 1 sets - 10 reps - 5-10 sec hold - Supine Knee Extension Mobilization with Weight  - 2-3 x daily - 7 x weekly - 1 sets - 1 reps - 3-5 min hold - Forward Step Up with Counter Support  - 1 x daily - 7 x weekly - 1 sets - 10 reps - Lateral Step Down  - 1 x daily - 7 x weekly - 1 sets - 10 reps - Seated Straight Leg Raise  - 1 x daily - 7 x weekly - 1 sets - 10 reps - Heel Raises with Counter Support  - 1 x daily - 7 x weekly - 1 sets - 20 reps - Standing Hip Extension with Counter Support  -  1 x daily - 7 x weekly - 1 sets - 20 reps - Standing Hip Abduction with Counter Support  - 1 x daily - 7 x weekly - 1 sets - 20 reps - Tandem Stance on Foam Pad with Eyes Open  - 1 x daily - 7 x weekly - 1 sets - 2-3 reps - 15-30 sec hold - Supine Quadriceps Stretch with Strap on Table  - 1 x daily - 7 x weekly - 1 sets - 2-3 reps - 30 sec hold  ASSESSMENT:  CLINICAL IMPRESSION: Continued focus on strengthening today.  Discussed community fitness and aquatic exercise as options at d/c.  Pt and family to look into fitness centers to continue exercise after d/c.  OBJECTIVE IMPAIRMENTS Abnormal gait, decreased activity tolerance, decreased balance, decreased mobility, difficulty walking, decreased ROM, decreased strength, increased edema, increased fascial restrictions, impaired flexibility, postural dysfunction, and pain.   ACTIVITY LIMITATIONS carrying, lifting, bending, sitting, standing, squatting, sleeping, stairs, transfers, bed mobility, and locomotion level  PARTICIPATION LIMITATIONS: meal prep, cleaning, laundry, driving, shopping, community activity, and occupation  PERSONAL FACTORS 1 comorbidity: hypothyroidism  are also affecting patient's functional outcome.   REHAB POTENTIAL: Good  CLINICAL DECISION MAKING: Stable/uncomplicated  EVALUATION COMPLEXITY: Low   GOALS: Goals reviewed  with patient? Yes  SHORT TERM GOALS: Target date: 12/12/2021  Independent with initial HEP Goal status:MET 12/17/21  2.  Lt knee AROM 0-10-100 for improved function. Goal status:MET 11/28/21  LONG TERM GOALS: Target date: 01/09/2022  Independent with final HEP Goal status: INITIAL  2.  FOTO score improved to 52 Goal status: INITIAL  3.  Lt knee AROM improved to 0-110 for improved mobility and function Goal status: INIITAL  4.  Report pain < 2/10 with standing/walking activities for improved function Goal status: INITIAL  5.  Amb without AD without significant deviations for improved function. Goal status: INITIAL    PLAN: PT FREQUENCY: 2x/week  PT DURATION: 8 weeks  PLANNED INTERVENTIONS: Therapeutic exercises, Therapeutic activity, Neuromuscular re-education, Balance training, Gait training, Patient/Family education, Self Care, Joint mobilization, Stair training, DME instructions, Aquatic Therapy, Dry Needling, Electrical stimulation, Cryotherapy, Moist heat, scar mobilization, Taping, Vasopneumatic device, Ultrasound, Ionotophoresis 80m/ml Dexamethasone, Manual therapy, and Re-evaluation  PLAN FOR NEXT SESSION:  continue strengthening/balance; Rt quad strengthening   SLaureen Abrahams PT, DPT 12/19/21 1:43 PM

## 2021-12-24 ENCOUNTER — Ambulatory Visit (INDEPENDENT_AMBULATORY_CARE_PROVIDER_SITE_OTHER): Payer: Medicare Other | Admitting: Physical Therapy

## 2021-12-24 ENCOUNTER — Encounter: Payer: Self-pay | Admitting: Physical Therapy

## 2021-12-24 DIAGNOSIS — M25562 Pain in left knee: Secondary | ICD-10-CM

## 2021-12-24 DIAGNOSIS — M25662 Stiffness of left knee, not elsewhere classified: Secondary | ICD-10-CM | POA: Diagnosis not present

## 2021-12-24 DIAGNOSIS — G8929 Other chronic pain: Secondary | ICD-10-CM

## 2021-12-24 DIAGNOSIS — M6281 Muscle weakness (generalized): Secondary | ICD-10-CM | POA: Diagnosis not present

## 2021-12-24 DIAGNOSIS — R600 Localized edema: Secondary | ICD-10-CM

## 2021-12-24 DIAGNOSIS — R2681 Unsteadiness on feet: Secondary | ICD-10-CM | POA: Diagnosis not present

## 2021-12-24 DIAGNOSIS — R2689 Other abnormalities of gait and mobility: Secondary | ICD-10-CM | POA: Diagnosis not present

## 2021-12-24 NOTE — Therapy (Signed)
OUTPATIENT PHYSICAL THERAPY TREATMENT     Patient Name: Toni Medina MRN: [id number] DOB:1931-09-04, 88 y.o., female Today's Date: 12/24/2021  PCP: Seward Carol, MD  REFERRING PROVIDER: Mcarthur Rossetti, MD   END OF SESSION:    PT End of Session - 12/24/21 1306     Visit Number 12    Number of Visits 16    Date for PT Re-Evaluation 01/09/22    Authorization Type Medicare/BCBS    Progress Note Due on Visit 19    PT Start Time 1255    PT Stop Time 1333    PT Time Calculation (min) 38 min    Activity Tolerance Patient tolerated treatment well;No increased pain    Behavior During Therapy WFL for tasks assessed/performed                    Past Medical History:  Diagnosis Date   Arthritis    Hypothyroidism    Past Surgical History:  Procedure Laterality Date   BREAST EXCISIONAL BIOPSY Left 2014   COLONOSCOPY     MENISCUS REPAIR     TOTAL KNEE ARTHROPLASTY Left 11/01/2021   Procedure: LEFT TOTAL KNEE ARTHROPLASTY;  Surgeon: Mcarthur Rossetti, MD;  Location: [facility];  Service: Orthopedics;  Laterality: Left;   Patient Active Problem List   Diagnosis Date Noted   Status post total left knee replacement 11/01/2021   Unilateral primary osteoarthritis, left knee 09/10/2020    REFERRING DIAG: M17.12 (ICD-10-CM) - Unilateral primary osteoarthritis, left knee Z96.652 (ICD-10-CM) - Status post total left knee replacement   THERAPY DIAG:  Chronic pain of left knee  Other abnormalities of gait and mobility  Localized edema  Stiffness of left knee, not elsewhere classified  Muscle weakness (generalized)  Unsteadiness on feet  Rationale for Evaluation and Treatment Rehabilitation  ONSET DATE: 11/01/21   SUBJECTIVE:   SUBJECTIVE STATEMENT: Knee feels okay at the moment, she has been having some back pain  PERTINENT HISTORY: hypothyroidism  PAIN:  NPRS scale: 2/10 Pain location: Lt knee Pain description: aching Aggravating  factors: exercises/bending knee, movement, static positioning for too long Relieving factors: meds, rest, ice  PRECAUTIONS: None  WEIGHT BEARING RESTRICTIONS No  FALLS:  Has patient fallen in last 6 months? No  LIVING ENVIRONMENT: Lives with: lives alone and daughter/son providing 24 hour care for the next few weeks Lives in: House/apartment Stairs: Yes: External: 2 steps; no railings, holds onto door jam; has 8 steps with handrail as well Has following equipment at home: Single point cane, Walker - 2 wheeled, and Electronics engineer  OCCUPATION: retired: Personnel officer  PLOF: Independent and Leisure: cooking and baking, reading, no regular exercise  PATIENT GOALS regain regular function, walk without a cane   OBJECTIVE:   PATIENT SURVEYS:  11/14/21: FOTO 38 (predicted 52)  COGNITION: 11/14/21 Overall cognitive status: Within functional limits for tasks assessed     SENSATION: 11/14/21 [facility]  EDEMA:  11/14/21 Circumferential: Joint Line: Rt: 37 cm /Lt: 39.5 cm   POSTURE:  11/14/21 rounded shoulders, forward head, and flexed trunk   LOWER EXTREMITY ROM:  Active ROM Right Eval 11/14/21 Left Eval 11/14/21 Left 11/20/21 Left 11/28/21 Left 12/03/21 Left 12/12/21  Knee flexion 121 85 100 AAROM with strap supine 112 117 120   Knee extension -2 (seated LAQ) -18 (seated LAQ)  -7 (seated LAQ) -7 (seated LAQ) -5   (Blank rows = not tested)  Passive ROM Right Eval 11/14/21 Left Eval 11/14/21  Knee flexion  92  Knee extension  -1   (Blank rows = not tested)  LOWER EXTREMITY MMT:  MMT Right Eval 11/14/21 Left Eval 11/14/21 Left 11/26/2021 Left 12/12/21  Knee flexion  3-/5 5/5 5/5  Knee extension  3-/5 4/5 4+/5   (Blank rows = not tested)   GAIT: 11/14/21: Distance walked: 100' in clinic Assistive device utilized: Single point cane Level of assistance: Modified independence Comments: decreased stance on Lt, decreased hip/knee flexion on Lt, flexed  trunk    TODAY'S TREATMENT: 12/24/21 Therex: Nu step L6 X 6 min Knee extension 5# left leg X 10, then 10# DL 2 X 10 Knee flexion bil 15# 2x10 Leg press DL 56# 2X10, then SL 25# 2X10 bilat Slantboard stretch 3x30 sec Forward step ups onto 6" step with 1 UE support x 15 reps bil Heel and toe raises X 15 Seated SLR 2X10 bilat  Neuro Re-Ed Retro walking at counter top 3 round trips Tandem stance with 1 finger support 2x30 sec bil  12/19/21 Therex: SciFit recumbent bike x 8 min; seat 8; L3 Leg Press single limb 31# 2x10 on Lt; 1x5, 1x7 on Rt; bil 62# 2x10 Seated LAQ with 3 sec hold 2x10 bil; 4# Trial of prone and supine quad stretch - pt prefers supine with strap performed 2x30 sec bil      PATIENT EDUCATION:  11/14/21 Education details: HEP Person educated: Patient and Child(ren) Education method: Explanation, Demonstration, and Handouts Education comprehension: verbalized understanding, returned demonstration, and needs further education   HOME EXERCISE PROGRAM: Access Code: [email or web address] URL: [email or web address] Date: 12/19/2021 Prepared by: Faustino Congress  Exercises - Long Sitting Quad Set  - 5-10 x daily - 7 x weekly - 1 sets - 5-10 reps - 5 sec hold - Supine Heel Slide with Strap  - 5-10 x daily - 7 x weekly - 1 sets - 5-10 reps - Seated Knee Extension AROM  - 3-5 x daily - 7 x weekly - 1 sets - 10 reps - 3-5 sec hold - Seated Knee Flexion AAROM  - 3-5 x daily - 7 x weekly - 1 sets - 10 reps - 5-10 sec hold - Supine Knee Extension Mobilization with Weight  - 2-3 x daily - 7 x weekly - 1 sets - 1 reps - 3-5 min hold - Forward Step Up with Counter Support  - 1 x daily - 7 x weekly - 1 sets - 10 reps - Lateral Step Down  - 1 x daily - 7 x weekly - 1 sets - 10 reps - Seated Straight Leg Raise  - 1 x daily - 7 x weekly - 1 sets - 10 reps - Heel Raises with Counter Support  - 1 x daily - 7 x weekly - 1 sets - 20 reps - Standing Hip Extension with  Counter Support  - 1 x daily - 7 x weekly - 1 sets - 20 reps - Standing Hip Abduction with Counter Support  - 1 x daily - 7 x weekly - 1 sets - 20 reps - Tandem Stance on Foam Pad with Eyes Open  - 1 x daily - 7 x weekly - 1 sets - 2-3 reps - 15-30 sec hold - Supine Quadriceps Stretch with Strap on Table  - 1 x daily - 7 x weekly - 1 sets - 2-3 reps - 30 sec hold  ASSESSMENT:  CLINICAL IMPRESSION: Continued focus on strengthening today.  She was somewhat limited by back pain today but still able to  perform all of her knee exercises working on ROM, strength and balance  OBJECTIVE IMPAIRMENTS Abnormal gait, decreased activity tolerance, decreased balance, decreased mobility, difficulty walking, decreased ROM, decreased strength, increased edema, increased fascial restrictions, impaired flexibility, postural dysfunction, and pain.   ACTIVITY LIMITATIONS carrying, lifting, bending, sitting, standing, squatting, sleeping, stairs, transfers, bed mobility, and locomotion level  PARTICIPATION LIMITATIONS: meal prep, cleaning, laundry, driving, shopping, community activity, and occupation  PERSONAL FACTORS 1 comorbidity: hypothyroidism  are also affecting patient's functional outcome.   REHAB POTENTIAL: Good  CLINICAL DECISION MAKING: Stable/uncomplicated  EVALUATION COMPLEXITY: Low   GOALS: Goals reviewed with patient? Yes  SHORT TERM GOALS: Target date: 12/12/2021  Independent with initial HEP Goal status:MET 12/17/21  2.  Lt knee AROM 0-10-100 for improved function. Goal status:MET 11/28/21  LONG TERM GOALS: Target date: 01/09/2022  Independent with final HEP Goal status: INITIAL  2.  FOTO score improved to 52 Goal status: INITIAL  3.  Lt knee AROM improved to 0-110 for improved mobility and function Goal status: INIITAL  4.  Report pain < 2/10 with standing/walking activities for improved function Goal status: INITIAL  5.  Amb without AD without significant deviations for  improved function. Goal status: INITIAL    PLAN: PT FREQUENCY: 2x/week  PT DURATION: 8 weeks  PLANNED INTERVENTIONS: Therapeutic exercises, Therapeutic activity, Neuromuscular re-education, Balance training, Gait training, Patient/Family education, Self Care, Joint mobilization, Stair training, DME instructions, Aquatic Therapy, Dry Needling, Electrical stimulation, Cryotherapy, Moist heat, scar mobilization, Taping, Vasopneumatic device, Ultrasound, Ionotophoresis 60m/ml Dexamethasone, Manual therapy, and Re-evaluation  PLAN FOR NEXT SESSION:  continue strengthening/balance; Rt quad strengthening, transition to independent program over remaining visits.  BElsie Ra PT, DPT 12/24/21 1:27 PM

## 2021-12-25 DIAGNOSIS — Z961 Presence of intraocular lens: Secondary | ICD-10-CM | POA: Diagnosis not present

## 2021-12-25 DIAGNOSIS — H52203 Unspecified astigmatism, bilateral: Secondary | ICD-10-CM | POA: Diagnosis not present

## 2021-12-31 ENCOUNTER — Ambulatory Visit (INDEPENDENT_AMBULATORY_CARE_PROVIDER_SITE_OTHER): Payer: Medicare Other | Admitting: Physical Therapy

## 2021-12-31 ENCOUNTER — Encounter: Payer: Self-pay | Admitting: Physical Therapy

## 2021-12-31 DIAGNOSIS — M6281 Muscle weakness (generalized): Secondary | ICD-10-CM | POA: Diagnosis not present

## 2021-12-31 DIAGNOSIS — M25662 Stiffness of left knee, not elsewhere classified: Secondary | ICD-10-CM | POA: Diagnosis not present

## 2021-12-31 DIAGNOSIS — R2689 Other abnormalities of gait and mobility: Secondary | ICD-10-CM

## 2021-12-31 DIAGNOSIS — M25562 Pain in left knee: Secondary | ICD-10-CM | POA: Diagnosis not present

## 2021-12-31 DIAGNOSIS — R600 Localized edema: Secondary | ICD-10-CM

## 2021-12-31 DIAGNOSIS — G8929 Other chronic pain: Secondary | ICD-10-CM

## 2021-12-31 DIAGNOSIS — R2681 Unsteadiness on feet: Secondary | ICD-10-CM

## 2021-12-31 NOTE — Therapy (Signed)
OUTPATIENT PHYSICAL THERAPY TREATMENT     Patient Name: Toni Medina MRN: [id number] DOB:1931/10/28, 88 y.o., female Today's Date: 12/31/2021  PCP: Seward Carol, MD  REFERRING PROVIDER: Mcarthur Rossetti, MD   END OF SESSION:    PT End of Session - 12/31/21 1109     Visit Number 13    Number of Visits 16    Date for PT Re-Evaluation 01/09/22    Authorization Type Medicare/BCBS    Progress Note Due on Visit 19    PT Start Time 1100    PT Stop Time 1138    PT Time Calculation (min) 38 min    Activity Tolerance Patient tolerated treatment well;No increased pain    Behavior During Therapy WFL for tasks assessed/performed                     Past Medical History:  Diagnosis Date   Arthritis    Hypothyroidism    Past Surgical History:  Procedure Laterality Date   BREAST EXCISIONAL BIOPSY Left 2014   COLONOSCOPY     MENISCUS REPAIR     TOTAL KNEE ARTHROPLASTY Left 11/01/2021   Procedure: LEFT TOTAL KNEE ARTHROPLASTY;  Surgeon: Mcarthur Rossetti, MD;  Location: [facility];  Service: Orthopedics;  Laterality: Left;   Patient Active Problem List   Diagnosis Date Noted   Status post total left knee replacement 11/01/2021   Unilateral primary osteoarthritis, left knee 09/10/2020    REFERRING DIAG: M17.12 (ICD-10-CM) - Unilateral primary osteoarthritis, left knee Z96.652 (ICD-10-CM) - Status post total left knee replacement   THERAPY DIAG:  Chronic pain of left knee  Other abnormalities of gait and mobility  Localized edema  Stiffness of left knee, not elsewhere classified  Muscle weakness (generalized)  Unsteadiness on feet  Rationale for Evaluation and Treatment Rehabilitation  ONSET DATE: 11/01/21   SUBJECTIVE:   SUBJECTIVE STATEMENT: Lt knee feels good, but limited by back pain today and difficulty standing up straight.  PERTINENT HISTORY: hypothyroidism  PAIN:  NPRS scale: 0/10 Pain location: Lt knee Pain description:  aching Aggravating factors: exercises/bending knee, movement, static positioning for too long Relieving factors: meds, rest, ice  PRECAUTIONS: None  WEIGHT BEARING RESTRICTIONS No  FALLS:  Has patient fallen in last 6 months? No  LIVING ENVIRONMENT: Lives with: lives alone and daughter/son providing 24 hour care for the next few weeks Lives in: House/apartment Stairs: Yes: External: 2 steps; no railings, holds onto door jam; has 8 steps with handrail as well Has following equipment at home: Single point cane, Walker - 2 wheeled, and Electronics engineer  OCCUPATION: retired: Personnel officer  PLOF: Independent and Leisure: cooking and baking, reading, no regular exercise  PATIENT GOALS regain regular function, walk without a cane   OBJECTIVE:   PATIENT SURVEYS:  11/14/21: FOTO 38 (predicted 52)  COGNITION: 11/14/21 Overall cognitive status: Within functional limits for tasks assessed     SENSATION: 11/14/21 [facility]  EDEMA:  11/14/21 Circumferential: Joint Line: Rt: 37 cm /Lt: 39.5 cm   POSTURE:  11/14/21 rounded shoulders, forward head, and flexed trunk   LOWER EXTREMITY ROM:  Active ROM Right Eval 11/14/21 Left Eval 11/14/21 Left 11/20/21 Left 11/28/21 Left 12/03/21 Left 12/12/21 Left 12/31/21  Knee flexion 121 85 100 AAROM with strap supine 112 117 120  120  Knee extension -2 (seated LAQ) -18 (seated LAQ)  -7 (seated LAQ) -7 (seated LAQ) -5 -5 seated LAQ   (Blank rows = not tested)  Passive ROM Right  Eval 11/14/21 Left Eval 11/14/21  Knee flexion  92  Knee extension  -1   (Blank rows = not tested)  LOWER EXTREMITY MMT:  MMT Right Eval 11/14/21 Left Eval 11/14/21 Left 11/26/2021 Left 12/12/21  Knee flexion  3-/5 5/5 5/5  Knee extension  3-/5 4/5 4+/5   (Blank rows = not tested)   GAIT: 11/14/21: Distance walked: 100' in clinic Assistive device utilized: Single point cane Level of assistance: Modified independence Comments: decreased stance on  Lt, decreased hip/knee flexion on Lt, flexed trunk    TODAY'S TREATMENT: 12/31/21 Therex: Nu step L5 X 8 min Knee extension machine 5# left leg 2 X 10, then 10# DL 2 X 10 Knee flexion machine bil 20# 2x10 Leg press DL 62# 2X10, then SL 31# 2X10 left, 25# X 10 on Rt Slantboard stretch 3x30 sec Forward step ups onto 6" step with 1 UE support x 15 reps on left, 10 reps on Rt Heel and toe raises X 20 Seated SLR 2X10 bilat Sit to stand with Rt leg further back for more weight shift onto Rt no UE support X 10 with slow lower   12/24/21 Therex: Nu step L6 X 6 min Knee extension 5# left leg X 10, then 10# DL 2 X 10 Knee flexion bil 15# 2x10 Leg press DL 56# 2X10, then SL 25# 2X10 bilat Slantboard stretch 3x30 sec Forward step ups onto 6" step with 1 UE support x 15 reps bil Heel and toe raises X 15 Seated SLR 2X10 bilat  Neuro Re-Ed Retro walking at counter top 3 round trips Tandem stance with 1 finger support 2x30 sec bil  12/19/21 Therex: [employer] recumbent bike x 8 min; seat 8; L3 Leg Press single limb 31# 2x10 on Lt; 1x5, 1x7 on Rt; bil 62# 2x10 Seated LAQ with 3 sec hold 2x10 bil; 4# Trial of prone and supine quad stretch - pt prefers supine with strap performed 2x30 sec bil      PATIENT EDUCATION:  11/14/21 Education details: HEP Person educated: Patient and Child(ren) Education method: Explanation, Demonstration, and Handouts Education comprehension: verbalized understanding, returned demonstration, and needs further education   HOME EXERCISE PROGRAM: Access Code: [email or web address] URL: [email or web address] Date: 12/19/2021 Prepared by: Faustino Congress  Exercises - Long Sitting Quad Set  - 5-10 x daily - 7 x weekly - 1 sets - 5-10 reps - 5 sec hold - Supine Heel Slide with Strap  - 5-10 x daily - 7 x weekly - 1 sets - 5-10 reps - Seated Knee Extension AROM  - 3-5 x daily - 7 x weekly - 1 sets - 10 reps - 3-5 sec hold - Seated Knee Flexion AAROM  -  3-5 x daily - 7 x weekly - 1 sets - 10 reps - 5-10 sec hold - Supine Knee Extension Mobilization with Weight  - 2-3 x daily - 7 x weekly - 1 sets - 1 reps - 3-5 min hold - Forward Step Up with Counter Support  - 1 x daily - 7 x weekly - 1 sets - 10 reps - Lateral Step Down  - 1 x daily - 7 x weekly - 1 sets - 10 reps - Seated Straight Leg Raise  - 1 x daily - 7 x weekly - 1 sets - 10 reps - Heel Raises with Counter Support  - 1 x daily - 7 x weekly - 1 sets - 20 reps - Standing Hip Extension with Counter Support  - 1 x daily -  7 x weekly - 1 sets - 20 reps - Standing Hip Abduction with Counter Support  - 1 x daily - 7 x weekly - 1 sets - 20 reps - Tandem Stance on Foam Pad with Eyes Open  - 1 x daily - 7 x weekly - 1 sets - 2-3 reps - 15-30 sec hold - Supine Quadriceps Stretch with Strap on Table  - 1 x daily - 7 x weekly - 1 sets - 2-3 reps - 30 sec hold  ASSESSMENT:  CLINICAL IMPRESSION: Left knee is doing very well post op and is now stronger than her Rt knee. She has complaints of not being able to step up onto stairs with her Rt knee so I showed her how to work her Rt leg more at home as well. We will continue to work left knee ROM and strength but will also help address her Rt knee weakness as well.   OBJECTIVE IMPAIRMENTS Abnormal gait, decreased activity tolerance, decreased balance, decreased mobility, difficulty walking, decreased ROM, decreased strength, increased edema, increased fascial restrictions, impaired flexibility, postural dysfunction, and pain.   ACTIVITY LIMITATIONS carrying, lifting, bending, sitting, standing, squatting, sleeping, stairs, transfers, bed mobility, and locomotion level  PARTICIPATION LIMITATIONS: meal prep, cleaning, laundry, driving, shopping, community activity, and occupation  PERSONAL FACTORS 1 comorbidity: hypothyroidism  are also affecting patient's functional outcome.   REHAB POTENTIAL: Good  CLINICAL DECISION MAKING:  Stable/uncomplicated  EVALUATION COMPLEXITY: Low   GOALS: Goals reviewed with patient? Yes  SHORT TERM GOALS: Target date: 12/12/2021  Independent with initial HEP Goal status:MET 12/17/21  2.  Lt knee AROM 0-10-100 for improved function. Goal status:MET 11/28/21  LONG TERM GOALS: Target date: 01/09/2022  Independent with final HEP Goal status: INITIAL  2.  FOTO score improved to 52 Goal status: INITIAL  3.  Lt knee AROM improved to 0-110 for improved mobility and function Goal status: INIITAL  4.  Report pain < 2/10 with standing/walking activities for improved function Goal status: INITIAL  5.  Amb without AD without significant deviations for improved function. Goal status: INITIAL    PLAN: PT FREQUENCY: 2x/week  PT DURATION: 8 weeks  PLANNED INTERVENTIONS: Therapeutic exercises, Therapeutic activity, Neuromuscular re-education, Balance training, Gait training, Patient/Family education, Self Care, Joint mobilization, Stair training, DME instructions, Aquatic Therapy, Dry Needling, Electrical stimulation, Cryotherapy, Moist heat, scar mobilization, Taping, Vasopneumatic device, Ultrasound, Ionotophoresis 22m/ml Dexamethasone, Manual therapy, and Re-evaluation  PLAN FOR NEXT SESSION:  continue strengthening/balance; Rt quad strengthening, transition to independent program over remaining visits.  BElsie Ra PT, DPT 12/31/21 11:14 AM

## 2022-01-02 ENCOUNTER — Encounter: Payer: Self-pay | Admitting: Physical Therapy

## 2022-01-02 ENCOUNTER — Ambulatory Visit (INDEPENDENT_AMBULATORY_CARE_PROVIDER_SITE_OTHER): Payer: Medicare Other | Admitting: Physical Therapy

## 2022-01-02 DIAGNOSIS — R2681 Unsteadiness on feet: Secondary | ICD-10-CM | POA: Diagnosis not present

## 2022-01-02 DIAGNOSIS — R2689 Other abnormalities of gait and mobility: Secondary | ICD-10-CM | POA: Diagnosis not present

## 2022-01-02 DIAGNOSIS — M25662 Stiffness of left knee, not elsewhere classified: Secondary | ICD-10-CM

## 2022-01-02 DIAGNOSIS — G8929 Other chronic pain: Secondary | ICD-10-CM

## 2022-01-02 DIAGNOSIS — M25562 Pain in left knee: Secondary | ICD-10-CM

## 2022-01-02 DIAGNOSIS — M6281 Muscle weakness (generalized): Secondary | ICD-10-CM

## 2022-01-02 DIAGNOSIS — R600 Localized edema: Secondary | ICD-10-CM | POA: Diagnosis not present

## 2022-01-02 NOTE — Therapy (Signed)
OUTPATIENT PHYSICAL THERAPY TREATMENT     Patient Name: Toni Medina MRN: [id number] DOB:September 18, 1931, 88 y.o., female Today's Date: 01/02/2022  PCP: Seward Carol, MD  REFERRING PROVIDER: Mcarthur Rossetti, MD   END OF SESSION:    PT End of Session - 01/02/22 1313     Visit Number 14    Number of Visits 16    Date for PT Re-Evaluation 01/09/22    Authorization Type Medicare/BCBS    Progress Note Due on Visit 19    PT Start Time 1300    PT Stop Time 1338    PT Time Calculation (min) 38 min    Activity Tolerance Patient tolerated treatment well;No increased pain    Behavior During Therapy WFL for tasks assessed/performed                     Past Medical History:  Diagnosis Date   Arthritis    Hypothyroidism    Past Surgical History:  Procedure Laterality Date   BREAST EXCISIONAL BIOPSY Left 2014   COLONOSCOPY     MENISCUS REPAIR     TOTAL KNEE ARTHROPLASTY Left 11/01/2021   Procedure: LEFT TOTAL KNEE ARTHROPLASTY;  Surgeon: Mcarthur Rossetti, MD;  Location: [facility];  Service: Orthopedics;  Laterality: Left;   Patient Active Problem List   Diagnosis Date Noted   Status post total left knee replacement 11/01/2021   Unilateral primary osteoarthritis, left knee 09/10/2020    REFERRING DIAG: M17.12 (ICD-10-CM) - Unilateral primary osteoarthritis, left knee Z96.652 (ICD-10-CM) - Status post total left knee replacement   THERAPY DIAG:  Chronic pain of left knee  Other abnormalities of gait and mobility  Localized edema  Stiffness of left knee, not elsewhere classified  Muscle weakness (generalized)  Unsteadiness on feet  Rationale for Evaluation and Treatment Rehabilitation  ONSET DATE: 11/01/21   SUBJECTIVE:   SUBJECTIVE STATEMENT: Lt knee feels good, but limited by back pain today and difficulty standing up straight.  PERTINENT HISTORY: hypothyroidism  PAIN:  NPRS scale: 0/10 Pain location: Lt knee Pain description:  aching Aggravating factors: exercises/bending knee, movement, static positioning for too long Relieving factors: meds, rest, ice  PRECAUTIONS: None  WEIGHT BEARING RESTRICTIONS No  FALLS:  Has patient fallen in last 6 months? No  LIVING ENVIRONMENT: Lives with: lives alone and daughter/son providing 24 hour care for the next few weeks Lives in: House/apartment Stairs: Yes: External: 2 steps; no railings, holds onto door jam; has 8 steps with handrail as well Has following equipment at home: Single point cane, Walker - 2 wheeled, and Electronics engineer  OCCUPATION: retired: Personnel officer  PLOF: Independent and Leisure: cooking and baking, reading, no regular exercise  PATIENT GOALS regain regular function, walk without a cane   OBJECTIVE:   PATIENT SURVEYS:  11/14/21: FOTO 38 (predicted 52)  COGNITION: 11/14/21 Overall cognitive status: Within functional limits for tasks assessed     SENSATION: 11/14/21 [facility]  EDEMA:  11/14/21 Circumferential: Joint Line: Rt: 37 cm /Lt: 39.5 cm   POSTURE:  11/14/21 rounded shoulders, forward head, and flexed trunk   LOWER EXTREMITY ROM:  Active ROM Right Eval 11/14/21 Left Eval 11/14/21 Left 11/20/21 Left 11/28/21 Left 12/03/21 Left 12/12/21 Left 12/31/21  Knee flexion 121 85 100 AAROM with strap supine 112 117 120  120  Knee extension -2 (seated LAQ) -18 (seated LAQ)  -7 (seated LAQ) -7 (seated LAQ) -5 -5 seated LAQ   (Blank rows = not tested)  Passive ROM Right  Eval 11/14/21 Left Eval 11/14/21  Knee flexion  92  Knee extension  -1   (Blank rows = not tested)  LOWER EXTREMITY MMT:  MMT Right Eval 11/14/21 Left Eval 11/14/21 Left 11/26/2021 Left 12/12/21  Knee flexion  3-/5 5/5 5/5  Knee extension  3-/5 4/5 4+/5   (Blank rows = not tested)   GAIT: 11/14/21: Distance walked: 100' in clinic Assistive device utilized: Single point cane Level of assistance: Modified independence Comments: decreased stance on  Lt, decreased hip/knee flexion on Lt, flexed trunk    TODAY'S TREATMENT: 01/02/22 Therex: Nu step L5 X 8 min Knee extension machine 5# left leg 2 X 10, then 10# DL 2 X 10, then 5# up with both down with Rt only Knee flexion machine bil 20# 2x10 Leg press DL 62# 2X10, then SL 31# 2X10 left, 31# X 5 on Rt then 25# X 10 on Rt Slantboard stretch 3x30 sec Forward step ups onto 6" step with opposite UE support x 15 reps on left, 15 reps on Rt Heel and toe raises X 20 Seated SLR 3X10 bilat Sit to stand with Rt leg further back for more weight shift onto Rt no UE support2 X 10 with slow lower  12/31/21 Therex: Nu step L5 X 8 min Knee extension machine 5# left leg 2 X 10, then 10# DL 2 X 10 Knee flexion machine bil 20# 2x10 Leg press DL 62# 2X10, then SL 31# 2X10 left, 25# X 10 on Rt Slantboard stretch 3x30 sec Forward step ups onto 6" step with 1 UE support x 15 reps on left, 10 reps on Rt Heel and toe raises X 20 Seated SLR 2X10 bilat Sit to stand with Rt leg further back for more weight shift onto Rt no UE support X 10 with slow lower      PATIENT EDUCATION:  11/14/21 Education details: HEP Person educated: Patient and Child(ren) Education method: Explanation, Demonstration, and Handouts Education comprehension: verbalized understanding, returned demonstration, and needs further education   HOME EXERCISE PROGRAM: Access Code: [email or web address] URL: [email or web address] Date: 12/19/2021 Prepared by: Faustino Congress  Exercises - Long Sitting Quad Set  - 5-10 x daily - 7 x weekly - 1 sets - 5-10 reps - 5 sec hold - Supine Heel Slide with Strap  - 5-10 x daily - 7 x weekly - 1 sets - 5-10 reps - Seated Knee Extension AROM  - 3-5 x daily - 7 x weekly - 1 sets - 10 reps - 3-5 sec hold - Seated Knee Flexion AAROM  - 3-5 x daily - 7 x weekly - 1 sets - 10 reps - 5-10 sec hold - Supine Knee Extension Mobilization with Weight  - 2-3 x daily - 7 x weekly - 1 sets - 1 reps  - 3-5 min hold - Forward Step Up with Counter Support  - 1 x daily - 7 x weekly - 1 sets - 10 reps - Lateral Step Down  - 1 x daily - 7 x weekly - 1 sets - 10 reps - Seated Straight Leg Raise  - 1 x daily - 7 x weekly - 1 sets - 10 reps - Heel Raises with Counter Support  - 1 x daily - 7 x weekly - 1 sets - 20 reps - Standing Hip Extension with Counter Support  - 1 x daily - 7 x weekly - 1 sets - 20 reps - Standing Hip Abduction with Counter Support  - 1 x daily - 7  x weekly - 1 sets - 20 reps - Tandem Stance on Foam Pad with Eyes Open  - 1 x daily - 7 x weekly - 1 sets - 2-3 reps - 15-30 sec hold - Supine Quadriceps Stretch with Strap on Table  - 1 x daily - 7 x weekly - 1 sets - 2-3 reps - 30 sec hold  ASSESSMENT:  CLINICAL IMPRESSION: Left knee continues to do well, she is more limited by Rt quad weakness than left so we worked extra on Rt leg today with continued strengthening for left leg. She will continue to benefit from PT.   OBJECTIVE IMPAIRMENTS Abnormal gait, decreased activity tolerance, decreased balance, decreased mobility, difficulty walking, decreased ROM, decreased strength, increased edema, increased fascial restrictions, impaired flexibility, postural dysfunction, and pain.   ACTIVITY LIMITATIONS carrying, lifting, bending, sitting, standing, squatting, sleeping, stairs, transfers, bed mobility, and locomotion level  PARTICIPATION LIMITATIONS: meal prep, cleaning, laundry, driving, shopping, community activity, and occupation  PERSONAL FACTORS 1 comorbidity: hypothyroidism  are also affecting patient's functional outcome.   REHAB POTENTIAL: Good  CLINICAL DECISION MAKING: Stable/uncomplicated  EVALUATION COMPLEXITY: Low   GOALS: Goals reviewed with patient? Yes  SHORT TERM GOALS: Target date: 12/12/2021  Independent with initial HEP Goal status:MET 12/17/21  2.  Lt knee AROM 0-10-100 for improved function. Goal status:MET 11/28/21  LONG TERM GOALS: Target  date: 01/09/2022  Independent with final HEP Goal status: INITIAL  2.  FOTO score improved to 52 Goal status: INITIAL  3.  Lt knee AROM improved to 0-110 for improved mobility and function Goal status: INIITAL  4.  Report pain < 2/10 with standing/walking activities for improved function Goal status: INITIAL  5.  Amb without AD without significant deviations for improved function. Goal status: INITIAL    PLAN: PT FREQUENCY: 2x/week  PT DURATION: 8 weeks  PLANNED INTERVENTIONS: Therapeutic exercises, Therapeutic activity, Neuromuscular re-education, Balance training, Gait training, Patient/Family education, Self Care, Joint mobilization, Stair training, DME instructions, Aquatic Therapy, Dry Needling, Electrical stimulation, Cryotherapy, Moist heat, scar mobilization, Taping, Vasopneumatic device, Ultrasound, Ionotophoresis 39m/ml Dexamethasone, Manual therapy, and Re-evaluation  PLAN FOR NEXT SESSION:  continue strengthening/balance; Rt quad strengthening BElsie Ra PT, DPT 01/02/22 1:13 PM

## 2022-01-07 ENCOUNTER — Ambulatory Visit (INDEPENDENT_AMBULATORY_CARE_PROVIDER_SITE_OTHER): Payer: Medicare Other | Admitting: Physical Therapy

## 2022-01-07 ENCOUNTER — Encounter: Payer: Self-pay | Admitting: Physical Therapy

## 2022-01-07 DIAGNOSIS — M25562 Pain in left knee: Secondary | ICD-10-CM | POA: Diagnosis not present

## 2022-01-07 DIAGNOSIS — G8929 Other chronic pain: Secondary | ICD-10-CM

## 2022-01-07 DIAGNOSIS — M25662 Stiffness of left knee, not elsewhere classified: Secondary | ICD-10-CM | POA: Diagnosis not present

## 2022-01-07 DIAGNOSIS — R2689 Other abnormalities of gait and mobility: Secondary | ICD-10-CM | POA: Diagnosis not present

## 2022-01-07 DIAGNOSIS — R2681 Unsteadiness on feet: Secondary | ICD-10-CM | POA: Diagnosis not present

## 2022-01-07 DIAGNOSIS — R600 Localized edema: Secondary | ICD-10-CM | POA: Diagnosis not present

## 2022-01-07 DIAGNOSIS — M6281 Muscle weakness (generalized): Secondary | ICD-10-CM

## 2022-01-07 NOTE — Therapy (Signed)
OUTPATIENT PHYSICAL THERAPY TREATMENT     Patient Name: Toni Medina MRN: [id number] DOB:1931/07/20, 88 y.o., female Today's Date: 01/07/2022  PCP: Seward Carol, MD  REFERRING PROVIDER: Mcarthur Rossetti, MD   END OF SESSION:    PT End of Session - 01/07/22 1309     Visit Number 15    Number of Visits 16    Date for PT Re-Evaluation 01/09/22    Authorization Type Medicare/BCBS    Progress Note Due on Visit 19    PT Start Time 1301    PT Stop Time 1339    PT Time Calculation (min) 38 min    Activity Tolerance Patient tolerated treatment well;No increased pain    Behavior During Therapy WFL for tasks assessed/performed                     Past Medical History:  Diagnosis Date   Arthritis    Hypothyroidism    Past Surgical History:  Procedure Laterality Date   BREAST EXCISIONAL BIOPSY Left 2014   COLONOSCOPY     MENISCUS REPAIR     TOTAL KNEE ARTHROPLASTY Left 11/01/2021   Procedure: LEFT TOTAL KNEE ARTHROPLASTY;  Surgeon: Mcarthur Rossetti, MD;  Location: [facility];  Service: Orthopedics;  Laterality: Left;   Patient Active Problem List   Diagnosis Date Noted   Status post total left knee replacement 11/01/2021   Unilateral primary osteoarthritis, left knee 09/10/2020    REFERRING DIAG: M17.12 (ICD-10-CM) - Unilateral primary osteoarthritis, left knee Z96.652 (ICD-10-CM) - Status post total left knee replacement   THERAPY DIAG:  Chronic pain of left knee  Other abnormalities of gait and mobility  Localized edema  Stiffness of left knee, not elsewhere classified  Muscle weakness (generalized)  Unsteadiness on feet  Rationale for Evaluation and Treatment Rehabilitation  ONSET DATE: 11/01/21   SUBJECTIVE:   SUBJECTIVE STATEMENT: Lt knee is doing good, still with concerns of Rt knee weakness, and back pain  PERTINENT HISTORY: hypothyroidism  PAIN:  NPRS scale: 0/10 Pain location: Lt knee Pain description:  aching Aggravating factors: exercises/bending knee, movement, static positioning for too long Relieving factors: meds, rest, ice  PRECAUTIONS: None  WEIGHT BEARING RESTRICTIONS No  FALLS:  Has patient fallen in last 6 months? No  LIVING ENVIRONMENT: Lives with: lives alone and daughter/son providing 24 hour care for the next few weeks Lives in: House/apartment Stairs: Yes: External: 2 steps; no railings, holds onto door jam; has 8 steps with handrail as well Has following equipment at home: Single point cane, Walker - 2 wheeled, and Electronics engineer  OCCUPATION: retired: Personnel officer  PLOF: Independent and Leisure: cooking and baking, reading, no regular exercise  PATIENT GOALS regain regular function, walk without a cane   OBJECTIVE:   PATIENT SURVEYS:  11/14/21: FOTO 38 (predicted 52)  COGNITION: 11/14/21 Overall cognitive status: Within functional limits for tasks assessed     SENSATION: 11/14/21 [facility]  EDEMA:  11/14/21 Circumferential: Joint Line: Rt: 37 cm /Lt: 39.5 cm   POSTURE:  11/14/21 rounded shoulders, forward head, and flexed trunk   LOWER EXTREMITY ROM:  Active ROM Right Eval 11/14/21 Left Eval 11/14/21 Left 11/20/21 Left 11/28/21 Left 12/03/21 Left 12/12/21 Left 12/31/21  Knee flexion 121 85 100 AAROM with strap supine 112 117 120  120  Knee extension -2 (seated LAQ) -18 (seated LAQ)  -7 (seated LAQ) -7 (seated LAQ) -5 -5 seated LAQ   (Blank rows = not tested)  Passive ROM Right  Eval 11/14/21 Left Eval 11/14/21  Knee flexion  92  Knee extension  -1   (Blank rows = not tested)  LOWER EXTREMITY MMT:  MMT Right Eval 11/14/21 Left Eval 11/14/21 Left 11/26/2021 Left 12/12/21  Knee flexion  3-/5 5/5 5/5  Knee extension  3-/5 4/5 4+/5   (Blank rows = not tested)   GAIT: 11/14/21: Distance walked: 100' in clinic Assistive device utilized: Single point cane Level of assistance: Modified independence Comments: decreased stance on  Lt, decreased hip/knee flexion on Lt, flexed trunk    TODAY'S TREATMENT: 01/07/22 Therex: Recumbent bike L3-2 X 6 min Knee extension machine 5# left leg X 10, Lt leg only 5#  X5, then 10# up with both down with Rt only X 5, then 10# DL 2X10 Knee flexion machine bil 20# 2x10 Leg press DL 62# 2X10, then SL 31# 2X10 left, 31# X 5 on Rt then 25# X 10 on Rt Slantboard stretch 3x30 sec Forward step ups onto 6" step with opposite UE support x 15 reps on left, 15 reps on Rt TKE green band 2X10 bilat holding 5 sec Heel and toe raises X 20 Seated SLR 3X10 bilat Sit to stand with Rt leg further back for more weight shift onto Rt no UE support2 X 10 with slow lower   01/02/22 Therex: Nu step L5 X 8 min Knee extension machine 5# left leg 2 X 10, then 10# DL 2 X 10, then 5# up with both down with Rt only Knee flexion machine bil 20# 2x10 Leg press DL 62# 2X10, then SL 31# 2X10 left, 31# X 5 on Rt then 25# X 10 on Rt Slantboard stretch 3x30 sec Forward step ups onto 6" step with opposite UE support x 15 reps on left, 15 reps on Rt Heel and toe raises X 20 Seated SLR 3X10 bilat Sit to stand with Rt leg further back for more weight shift onto Rt no UE support2 X 10 with slow lower  PATIENT EDUCATION:  11/14/21 Education details: HEP Person educated: Patient and Child(ren) Education method: Explanation, Demonstration, and Handouts Education comprehension: verbalized understanding, returned demonstration, and needs further education   HOME EXERCISE PROGRAM: Access Code: [email or web address] URL: [email or web address] Date: 12/19/2021 Prepared by: Faustino Congress  Exercises - Long Sitting Quad Set  - 5-10 x daily - 7 x weekly - 1 sets - 5-10 reps - 5 sec hold - Supine Heel Slide with Strap  - 5-10 x daily - 7 x weekly - 1 sets - 5-10 reps - Seated Knee Extension AROM  - 3-5 x daily - 7 x weekly - 1 sets - 10 reps - 3-5 sec hold - Seated Knee Flexion AAROM  - 3-5 x daily - 7 x weekly  - 1 sets - 10 reps - 5-10 sec hold - Supine Knee Extension Mobilization with Weight  - 2-3 x daily - 7 x weekly - 1 sets - 1 reps - 3-5 min hold - Forward Step Up with Counter Support  - 1 x daily - 7 x weekly - 1 sets - 10 reps - Lateral Step Down  - 1 x daily - 7 x weekly - 1 sets - 10 reps - Seated Straight Leg Raise  - 1 x daily - 7 x weekly - 1 sets - 10 reps - Heel Raises with Counter Support  - 1 x daily - 7 x weekly - 1 sets - 20 reps - Standing Hip Extension with Counter Support  - 1 x  daily - 7 x weekly - 1 sets - 20 reps - Standing Hip Abduction with Counter Support  - 1 x daily - 7 x weekly - 1 sets - 20 reps - Tandem Stance on Foam Pad with Eyes Open  - 1 x daily - 7 x weekly - 1 sets - 2-3 reps - 15-30 sec hold - Supine Quadriceps Stretch with Strap on Table  - 1 x daily - 7 x weekly - 1 sets - 2-3 reps - 30 sec hold  ASSESSMENT:  CLINICAL IMPRESSION: We continued to work on bilat leg strength focus as her Rt knee limits her functional ability for stairs, sit to stands, and squatting. She will be at the end of her PT Plan of care next visit so would recommend short recert of up to another 4 weeks as she feels she needs to continue with PT for leg strength and feel this would be medically necessary.   OBJECTIVE IMPAIRMENTS Abnormal gait, decreased activity tolerance, decreased balance, decreased mobility, difficulty walking, decreased ROM, decreased strength, increased edema, increased fascial restrictions, impaired flexibility, postural dysfunction, and pain.   ACTIVITY LIMITATIONS carrying, lifting, bending, sitting, standing, squatting, sleeping, stairs, transfers, bed mobility, and locomotion level  PARTICIPATION LIMITATIONS: meal prep, cleaning, laundry, driving, shopping, community activity, and occupation  PERSONAL FACTORS 1 comorbidity: hypothyroidism  are also affecting patient's functional outcome.   REHAB POTENTIAL: Good  CLINICAL DECISION MAKING:  Stable/uncomplicated  EVALUATION COMPLEXITY: Low   GOALS: Goals reviewed with patient? Yes  SHORT TERM GOALS: Target date: 12/12/2021  Independent with initial HEP Goal status:MET 12/17/21  2.  Lt knee AROM 0-10-100 for improved function. Goal status:MET 11/28/21  LONG TERM GOALS: Target date: 01/09/2022  Independent with final HEP Goal status: INITIAL  2.  FOTO score improved to 52 Goal status: INITIAL  3.  Lt knee AROM improved to 0-110 for improved mobility and function Goal status: INIITAL  4.  Report pain < 2/10 with standing/walking activities for improved function Goal status: INITIAL  5.  Amb without AD without significant deviations for improved function. Goal status: INITIAL    PLAN: PT FREQUENCY: 2x/week  PT DURATION: 8 weeks  PLANNED INTERVENTIONS: Therapeutic exercises, Therapeutic activity, Neuromuscular re-education, Balance training, Gait training, Patient/Family education, Self Care, Joint mobilization, Stair training, DME instructions, Aquatic Therapy, Dry Needling, Electrical stimulation, Cryotherapy, Moist heat, scar mobilization, Taping, Vasopneumatic device, Ultrasound, Ionotophoresis 70m/ml Dexamethasone, Manual therapy, and Re-evaluation  PLAN FOR NEXT SESSION:  will need recert. continue strengthening/balance; Rt quad strengthening  BElsie Ra PT, DPT 01/07/22 1:10 PM

## 2022-01-09 ENCOUNTER — Encounter: Payer: Self-pay | Admitting: Rehabilitative and Restorative Service Providers"

## 2022-01-09 ENCOUNTER — Ambulatory Visit (INDEPENDENT_AMBULATORY_CARE_PROVIDER_SITE_OTHER): Payer: Medicare Other | Admitting: Rehabilitative and Restorative Service Providers"

## 2022-01-09 DIAGNOSIS — R2689 Other abnormalities of gait and mobility: Secondary | ICD-10-CM

## 2022-01-09 DIAGNOSIS — M25662 Stiffness of left knee, not elsewhere classified: Secondary | ICD-10-CM | POA: Diagnosis not present

## 2022-01-09 DIAGNOSIS — R2681 Unsteadiness on feet: Secondary | ICD-10-CM | POA: Diagnosis not present

## 2022-01-09 DIAGNOSIS — G8929 Other chronic pain: Secondary | ICD-10-CM

## 2022-01-09 DIAGNOSIS — R600 Localized edema: Secondary | ICD-10-CM

## 2022-01-09 DIAGNOSIS — M25562 Pain in left knee: Secondary | ICD-10-CM

## 2022-01-09 DIAGNOSIS — M6281 Muscle weakness (generalized): Secondary | ICD-10-CM

## 2022-01-09 NOTE — Therapy (Signed)
OUTPATIENT PHYSICAL THERAPY TREATMENT /PROGRESS NOTE/ RECERT     Patient Name: Toni Medina MRN: [id number] DOB:[birth year], 88 y.o., female Today's Date: 01/09/2022  PCP: Seward Carol, MD  REFERRING PROVIDER: Mcarthur Rossetti, MD   Progress Note Reporting Period 12/12/2021 to 01/09/2022  See note below for Objective Data and Assessment of Progress/Goals.      END OF SESSION:    PT End of Session - 01/09/22 1256     Visit Number 16    Number of Visits 27    Date for PT Re-Evaluation 02/20/22    Authorization Type Medicare/BCBS - KX after 15    Progress Note Due on Visit 25    PT Start Time 1257    PT Stop Time 1336    PT Time Calculation (min) 39 min    Activity Tolerance Patient tolerated treatment well    Behavior During Therapy WFL for tasks assessed/performed                      Past Medical History:  Diagnosis Date   Arthritis    Hypothyroidism    Past Surgical History:  Procedure Laterality Date   BREAST EXCISIONAL BIOPSY Left 2014   COLONOSCOPY     MENISCUS REPAIR     TOTAL KNEE ARTHROPLASTY Left 11/01/2021   Procedure: LEFT TOTAL KNEE ARTHROPLASTY;  Surgeon: Mcarthur Rossetti, MD;  Location: [facility];  Service: Orthopedics;  Laterality: Left;   Patient Active Problem List   Diagnosis Date Noted   Status post total left knee replacement 11/01/2021   Unilateral primary osteoarthritis, left knee 09/10/2020    REFERRING DIAG: M17.12 (ICD-10-CM) - Unilateral primary osteoarthritis, left knee Z96.652 (ICD-10-CM) - Status post total left knee replacement   THERAPY DIAG:  Chronic pain of left knee  Unsteadiness on feet  Muscle weakness (generalized)  Other abnormalities of gait and mobility  Localized edema  Stiffness of left knee, not elsewhere classified  Rationale for Evaluation and Treatment Rehabilitation  ONSET DATE: 11/01/21   SUBJECTIVE:   SUBJECTIVE STATEMENT: Pt indicated very little pain in surgery  leg.  Pt indicated feeling like the other leg can give her trouble more than surgery leg.  Reported coming up side steps at home with some difficulty.   PERTINENT HISTORY: hypothyroidism  PAIN:  NPRS scale: at worst in last week 3/10 Pain location: Lt knee Pain description: aching Aggravating factors: exercises/bending knee, movement, static positioning for too long Relieving factors: meds, rest, ice  PRECAUTIONS: None  WEIGHT BEARING RESTRICTIONS No  FALLS:  Has patient fallen in last 6 months? No  LIVING ENVIRONMENT: Lives with: lives alone and daughter/son providing 24 hour care for the next few weeks Lives in: House/apartment Stairs: Yes: External: 2 steps; no railings, holds onto door jam; has 8 steps with handrail as well Has following equipment at home: Single point cane, Walker - 2 wheeled, and Electronics engineer  OCCUPATION: retired: Personnel officer  PLOF: Independent and Leisure: cooking and baking, reading, no regular exercise  PATIENT GOALS regain regular function, walk without a cane   OBJECTIVE:   PATIENT SURVEYS:  01/09/2022: FOTO update: 55  11/14/21: FOTO 38 (predicted 52)  COGNITION: 11/14/21 Overall cognitive status: Within functional limits for tasks assessed     SENSATION: 11/14/21 [facility]  EDEMA:  11/14/21 Circumferential: Joint Line: Rt: 37 cm /Lt: 39.5 cm   POSTURE:  11/14/21 rounded shoulders, forward head, and flexed trunk   LOWER EXTREMITY ROM:  Active ROM Right Eval  11/14/21 Left Eval 11/14/21 Left 11/20/21 Left 11/28/21 Left 12/03/21 Left 12/12/21 Left 12/31/21 Left 01/09/2022  Knee flexion 121 85 100 AAROM with strap supine 112 117 120  120 120 AROM in supine heel slide  Knee extension -2 (seated LAQ) -18 (seated LAQ)  -7 (seated LAQ) -7 (seated LAQ) -5 -5 seated LAQ -4 seated AROM in LAQ   (Blank rows = not tested)  Passive ROM Right Eval 11/14/21 Left Eval 11/14/21  Knee flexion  92  Knee extension  -1   (Blank rows =  not tested)  LOWER EXTREMITY MMT:  MMT Right Eval 11/14/21 Left Eval 11/14/21 Left 11/26/2021 Left 12/12/21 Left 01/09/2022  Knee flexion  3-/5 5/5 5/5 5/5  Knee extension  3-/5 4/5 4+/5 4+/5   (Blank rows = not tested)   01/09/2022 : Lt SLS 8 seconds prior to loss of balance  GAIT: 01/09/2022:  Ambulated into clinic c SPC use.  Reported no cane use at home.   11/14/21: Distance walked: 100' in clinic Assistive device utilized: Single point cane Level of assistance: Modified independence Comments: decreased stance on Lt, decreased hip/knee flexion on Lt, flexed trunk    TODAY'S TREATMENT: 01/09/22 Therex: Nustep Lvl 5 8 mins UE/LE Knee extension machine 5 lbs double leg up, Lt leg lowering slowly 2 x 10  Leg press DL 62 lb x15 , then SL 31 lbs x 15 Lt Slantboard stretch bilateral gastroc 3x30 sec Heel and toe raises alternating x 15 c finger tip assist on bar bilateral hands  TherActivity (to improve step navigation) Lateral step down control focus WB on Lt 4 inch step bilateral hand assist on bar x 10 4 inch step WB Lt leg:  step up with Rt hand rail, step down with Lt hand rail to mimic home - x 15 each  01/07/22 Therex: Recumbent bike L3-2 X 6 min Knee extension machine 5# left leg X 10, Lt leg only 5#  X5, then 10# up with both down with Rt only X 5, then 10# DL 2X10 Knee flexion machine bil 20# 2x10 Leg press DL 62# 2X10, then SL 31# 2X10 left, 31# X 5 on Rt then 25# X 10 on Rt Slantboard stretch 3x30 sec Forward step ups onto 6" step with opposite UE support x 15 reps on left, 15 reps on Rt TKE green band 2X10 bilat holding 5 sec Heel and toe raises X 20 Seated SLR 3X10 bilat Sit to stand with Rt leg further back for more weight shift onto Rt no UE support2 X 10 with slow lower   01/02/22 Therex: Nu step L5 X 8 min Knee extension machine 5# left leg 2 X 10, then 10# DL 2 X 10, then 5# up with both down with Rt only Knee flexion machine bil 20# 2x10 Leg  press DL 62# 2X10, then SL 31# 2X10 left, 31# X 5 on Rt then 25# X 10 on Rt Slantboard stretch 3x30 sec Forward step ups onto 6" step with opposite UE support x 15 reps on left, 15 reps on Rt Heel and toe raises X 20 Seated SLR 3X10 bilat Sit to stand with Rt leg further back for more weight shift onto Rt no UE support2 X 10 with slow lower  PATIENT EDUCATION:  11/14/21 Education details: HEP Person educated: Patient and Child(ren) Education method: Explanation, Demonstration, and Handouts Education comprehension: verbalized understanding, returned demonstration, and needs further education   HOME EXERCISE PROGRAM: Access Code: [email or web address] URL: [email or web address] Date: 12/19/2021 Prepared by:  Faustino Congress  Exercises - Long Sitting Quad Set  - 5-10 x daily - 7 x weekly - 1 sets - 5-10 reps - 5 sec hold - Supine Heel Slide with Strap  - 5-10 x daily - 7 x weekly - 1 sets - 5-10 reps - Seated Knee Extension AROM  - 3-5 x daily - 7 x weekly - 1 sets - 10 reps - 3-5 sec hold - Seated Knee Flexion AAROM  - 3-5 x daily - 7 x weekly - 1 sets - 10 reps - 5-10 sec hold - Supine Knee Extension Mobilization with Weight  - 2-3 x daily - 7 x weekly - 1 sets - 1 reps - 3-5 min hold - Forward Step Up with Counter Support  - 1 x daily - 7 x weekly - 1 sets - 10 reps - Lateral Step Down  - 1 x daily - 7 x weekly - 1 sets - 10 reps - Seated Straight Leg Raise  - 1 x daily - 7 x weekly - 1 sets - 10 reps - Heel Raises with Counter Support  - 1 x daily - 7 x weekly - 1 sets - 20 reps - Standing Hip Extension with Counter Support  - 1 x daily - 7 x weekly - 1 sets - 20 reps - Standing Hip Abduction with Counter Support  - 1 x daily - 7 x weekly - 1 sets - 20 reps - Tandem Stance on Foam Pad with Eyes Open  - 1 x daily - 7 x weekly - 1 sets - 2-3 reps - 15-30 sec hold - Supine Quadriceps Stretch with Strap on Table  - 1 x daily - 7 x weekly - 1 sets - 2-3 reps - 30 sec  hold  ASSESSMENT:  CLINICAL IMPRESSION: Pt has attended 16 visits overall during course of treatment.  See objective data for updated information.  Gains have been noted towards goals at this time but mild impairments still present. Pt demonstrated medical necessity for continued skilled PT services to address remaining strength, balance and ambulation independence goals.    OBJECTIVE IMPAIRMENTS Abnormal gait, decreased activity tolerance, decreased balance, decreased mobility, difficulty walking, decreased ROM, decreased strength, increased edema, increased fascial restrictions, impaired flexibility, postural dysfunction, and pain.   ACTIVITY LIMITATIONS carrying, lifting, bending, sitting, standing, squatting, sleeping, stairs, transfers, bed mobility, and locomotion level  PARTICIPATION LIMITATIONS: meal prep, cleaning, laundry, driving, shopping, community activity, and occupation  PERSONAL FACTORS 1 comorbidity: hypothyroidism  are also affecting patient's functional outcome.   REHAB POTENTIAL: Good  CLINICAL DECISION MAKING: Stable/uncomplicated  EVALUATION COMPLEXITY: Low   GOALS: Goals reviewed with patient? Yes  SHORT TERM GOALS: Target date: 12/12/2021  Independent with initial HEP Goal status:MET 12/17/21  2.  Lt knee AROM 0-10-100 for improved function. Goal status:MET 11/28/21  LONG TERM GOALS: Target date: 02/20/2022  Independent with final HEP Goal status: revised 01/09/2022  2.  FOTO score improved to 52 Goal status: Met 01/09/2022  3.  Lt knee AROM improved to 0-110 for improved mobility and function Goal status:  revised 01/09/2022  4.  Report pain < 2/10 with standing/walking activities for improved function Goal status:  revised 01/09/2022  5.  Amb without AD without significant deviations for improved function. Goal status:  revised 01/09/2022  6. Patient will demonstrate ascending/descending 4 stairs reciprocally s UE assist for household entry.   Goal Status: new 127/2023   PLAN: PT FREQUENCY: 2x/week  PT DURATION: 6 weeks  PLANNED INTERVENTIONS: Therapeutic exercises, Therapeutic activity, Neuromuscular re-education, Balance training, Gait training, Patient/Family education, Self Care, Joint mobilization, Stair training, DME instructions, Aquatic Therapy, Dry Needling, Electrical stimulation, Cryotherapy, Moist heat, scar mobilization, Taping, Vasopneumatic device, Ultrasound, Ionotophoresis 34m/ml Dexamethasone, Manual therapy, and Re-evaluation  PLAN FOR NEXT SESSION:  Progressive strengthening, stair control improvements.  KX needed after 15 visits with medical necessity  MScot Jun PT, DPT, OCS, ATC 01/09/22  1:38 PM

## 2022-01-13 NOTE — Therapy (Deleted)
OUTPATIENT PHYSICAL THERAPY TREATMENT /PROGRESS NOTE/ RECERT     Patient Name: Toni Medina MRN: [id number] DOB:[birth year], 88 y.o., female Today's Date: 01/13/2022  PCP: Seward Carol, MD  REFERRING PROVIDER: Mcarthur Rossetti, MD   Progress Note Reporting Period 12/12/2021 to 01/09/2022  See note below for Objective Data and Assessment of Progress/Goals.      END OF SESSION:               Past Medical History:  Diagnosis Date   Arthritis    Hypothyroidism    Past Surgical History:  Procedure Laterality Date   BREAST EXCISIONAL BIOPSY Left 2014   COLONOSCOPY     MENISCUS REPAIR     TOTAL KNEE ARTHROPLASTY Left 11/01/2021   Procedure: LEFT TOTAL KNEE ARTHROPLASTY;  Surgeon: Mcarthur Rossetti, MD;  Location: [facility];  Service: Orthopedics;  Laterality: Left;   Patient Active Problem List   Diagnosis Date Noted   Status post total left knee replacement 11/01/2021   Unilateral primary osteoarthritis, left knee 09/10/2020    REFERRING DIAG: M17.12 (ICD-10-CM) - Unilateral primary osteoarthritis, left knee Z96.652 (ICD-10-CM) - Status post total left knee replacement   THERAPY DIAG:  No diagnosis found.  Rationale for Evaluation and Treatment Rehabilitation  ONSET DATE: 11/01/21   SUBJECTIVE:   SUBJECTIVE STATEMENT: Pt indicated very little pain in surgery leg.  Pt indicated feeling like the other leg can give her trouble more than surgery leg.  Reported coming up side steps at home with some difficulty.   PERTINENT HISTORY: hypothyroidism  PAIN:  NPRS scale: at worst in last week 3/10 Pain location: Lt knee Pain description: aching Aggravating factors: exercises/bending knee, movement, static positioning for too long Relieving factors: meds, rest, ice  PRECAUTIONS: None  WEIGHT BEARING RESTRICTIONS No  FALLS:  Has patient fallen in last 6 months? No  LIVING ENVIRONMENT: Lives with: lives alone and daughter/son providing  24 hour care for the next few weeks Lives in: House/apartment Stairs: Yes: External: 2 steps; no railings, holds onto door jam; has 8 steps with handrail as well Has following equipment at home: Single point cane, Walker - 2 wheeled, and Electronics engineer  OCCUPATION: retired: Personnel officer  PLOF: Independent and Leisure: cooking and baking, reading, no regular exercise  PATIENT GOALS regain regular function, walk without a cane   OBJECTIVE:   PATIENT SURVEYS:  01/09/2022: FOTO update: 55  11/14/21: FOTO 38 (predicted 52)  COGNITION: 11/14/21 Overall cognitive status: Within functional limits for tasks assessed     SENSATION: 11/14/21 [facility]  EDEMA:  11/14/21 Circumferential: Joint Line: Rt: 37 cm /Lt: 39.5 cm   POSTURE:  11/14/21 rounded shoulders, forward head, and flexed trunk   LOWER EXTREMITY ROM:  Active ROM Right Eval 11/14/21 Left Eval 11/14/21 Left 11/20/21 Left 11/28/21 Left 12/03/21 Left 12/12/21 Left 12/31/21 Left 01/09/2022  Knee flexion 121 85 100 AAROM with strap supine 112 117 120  120 120 AROM in supine heel slide  Knee extension -2 (seated LAQ) -18 (seated LAQ)  -7 (seated LAQ) -7 (seated LAQ) -5 -5 seated LAQ -4 seated AROM in LAQ   (Blank rows = not tested)  Passive ROM Right Eval 11/14/21 Left Eval 11/14/21  Knee flexion  92  Knee extension  -1   (Blank rows = not tested)  LOWER EXTREMITY MMT:  MMT Right Eval 11/14/21 Left Eval 11/14/21 Left 11/26/2021 Left 12/12/21 Left 01/09/2022  Knee flexion  3-/5 5/5 5/5 5/5  Knee extension  3-/5 4/5 4+/5  4+/5   (Blank rows = not tested)   01/09/2022 : Lt SLS 8 seconds prior to loss of balance  GAIT: 01/09/2022:  Ambulated into clinic c SPC use.  Reported no cane use at home.   11/14/21: Distance walked: 100' in clinic Assistive device utilized: Single point cane Level of assistance: Modified independence Comments: decreased stance on Lt, decreased hip/knee flexion on Lt, flexed  trunk    TODAY'S TREATMENT: 01/09/22 Therex: Nustep Lvl 5 8 mins UE/LE Knee extension machine 5 lbs double leg up, Lt leg lowering slowly 2 x 10  Leg press DL 62 lb x15 , then SL 31 lbs x 15 Lt Slantboard stretch bilateral gastroc 3x30 sec Heel and toe raises alternating x 15 c finger tip assist on bar bilateral hands  TherActivity (to improve step navigation) Lateral step down control focus WB on Lt 4 inch step bilateral hand assist on bar x 10 4 inch step WB Lt leg:  step up with Rt hand rail, step down with Lt hand rail to mimic home - x 15 each  01/07/22 Therex: Recumbent bike L3-2 X 6 min Knee extension machine 5# left leg X 10, Lt leg only 5#  X5, then 10# up with both down with Rt only X 5, then 10# DL 2X10 Knee flexion machine bil 20# 2x10 Leg press DL 62# 2X10, then SL 31# 2X10 left, 31# X 5 on Rt then 25# X 10 on Rt Slantboard stretch 3x30 sec Forward step ups onto 6" step with opposite UE support x 15 reps on left, 15 reps on Rt TKE green band 2X10 bilat holding 5 sec Heel and toe raises X 20 Seated SLR 3X10 bilat Sit to stand with Rt leg further back for more weight shift onto Rt no UE support2 X 10 with slow lower   01/02/22 Therex: Nu step L5 X 8 min Knee extension machine 5# left leg 2 X 10, then 10# DL 2 X 10, then 5# up with both down with Rt only Knee flexion machine bil 20# 2x10 Leg press DL 62# 2X10, then SL 31# 2X10 left, 31# X 5 on Rt then 25# X 10 on Rt Slantboard stretch 3x30 sec Forward step ups onto 6" step with opposite UE support x 15 reps on left, 15 reps on Rt Heel and toe raises X 20 Seated SLR 3X10 bilat Sit to stand with Rt leg further back for more weight shift onto Rt no UE support2 X 10 with slow lower  PATIENT EDUCATION:  11/14/21 Education details: HEP Person educated: Patient and Child(ren) Education method: Explanation, Demonstration, and Handouts Education comprehension: verbalized understanding, returned demonstration, and needs  further education   HOME EXERCISE PROGRAM: Access Code: [email or web address] URL: [email or web address] Date: 12/19/2021 Prepared by: Stephanie Matthews  Exercises - Long Sitting Quad Set  - 5-10 x daily - 7 x weekly - 1 sets - 5-10 reps - 5 sec hold - Supine Heel Slide with Strap  - 5-10 x daily - 7 x weekly - 1 sets - 5-10 reps - Seated Knee Extension AROM  - 3-5 x daily - 7 x weekly - 1 sets - 10 reps - 3-5 sec hold - Seated Knee Flexion AAROM  - 3-5 x daily - 7 x weekly - 1 sets - 10 reps - 5-10 sec hold - Supine Knee Extension Mobilization with Weight  - 2-3 x daily - 7 x weekly - 1 sets - 1 reps - 3-5 min hold - Forward Step Up with   Counter Support  - 1 x daily - 7 x weekly - 1 sets - 10 reps - Lateral Step Down  - 1 x daily - 7 x weekly - 1 sets - 10 reps - Seated Straight Leg Raise  - 1 x daily - 7 x weekly - 1 sets - 10 reps - Heel Raises with Counter Support  - 1 x daily - 7 x weekly - 1 sets - 20 reps - Standing Hip Extension with Counter Support  - 1 x daily - 7 x weekly - 1 sets - 20 reps - Standing Hip Abduction with Counter Support  - 1 x daily - 7 x weekly - 1 sets - 20 reps - Tandem Stance on Foam Pad with Eyes Open  - 1 x daily - 7 x weekly - 1 sets - 2-3 reps - 15-30 sec hold - Supine Quadriceps Stretch with Strap on Table  - 1 x daily - 7 x weekly - 1 sets - 2-3 reps - 30 sec hold  ASSESSMENT:  CLINICAL IMPRESSION: Pt has attended 16 visits overall during course of treatment.  See objective data for updated information.  Gains have been noted towards goals at this time but mild impairments still present. Pt demonstrated medical necessity for continued skilled PT services to address remaining strength, balance and ambulation independence goals.    OBJECTIVE IMPAIRMENTS Abnormal gait, decreased activity tolerance, decreased balance, decreased mobility, difficulty walking, decreased ROM, decreased strength, increased edema, increased fascial restrictions, impaired  flexibility, postural dysfunction, and pain.   ACTIVITY LIMITATIONS carrying, lifting, bending, sitting, standing, squatting, sleeping, stairs, transfers, bed mobility, and locomotion level  PARTICIPATION LIMITATIONS: meal prep, cleaning, laundry, driving, shopping, community activity, and occupation  PERSONAL FACTORS 1 comorbidity: hypothyroidism  are also affecting patient's functional outcome.   REHAB POTENTIAL: Good  CLINICAL DECISION MAKING: Stable/uncomplicated  EVALUATION COMPLEXITY: Low   GOALS: Goals reviewed with patient? Yes  SHORT TERM GOALS: Target date: 12/12/2021  Independent with initial HEP Goal status:MET 12/17/21  2.  Lt knee AROM 0-10-100 for improved function. Goal status:MET 11/28/21  LONG TERM GOALS: Target date: 02/20/2022  Independent with final HEP Goal status: revised 01/09/2022  2.  FOTO score improved to 52 Goal status: Met 01/09/2022  3.  Lt knee AROM improved to 0-110 for improved mobility and function Goal status:  revised 01/09/2022  4.  Report pain < 2/10 with standing/walking activities for improved function Goal status:  revised 01/09/2022  5.  Amb without AD without significant deviations for improved function. Goal status:  revised 01/09/2022  6. Patient will demonstrate ascending/descending 4 stairs reciprocally s UE assist for household entry.  Goal Status: new 127/2023   PLAN: PT FREQUENCY: 2x/week  PT DURATION: 6 weeks  PLANNED INTERVENTIONS: Therapeutic exercises, Therapeutic activity, Neuromuscular re-education, Balance training, Gait training, Patient/Family education, Self Care, Joint mobilization, Stair training, DME instructions, Aquatic Therapy, Dry Needling, Electrical stimulation, Cryotherapy, Moist heat, scar mobilization, Taping, Vasopneumatic device, Ultrasound, Ionotophoresis 54m/ml Dexamethasone, Manual therapy, and Re-evaluation  PLAN FOR NEXT SESSION:  Progressive strengthening, stair control improvements.  KX  needed after 15 visits with medical necessity  SRudi HeapPT, DPT 01/13/22  4:05 PM

## 2022-01-14 ENCOUNTER — Encounter: Payer: Medicare Other | Admitting: Physical Therapy

## 2022-01-14 NOTE — Therapy (Deleted)
OUTPATIENT PHYSICAL THERAPY TREATMENT /PROGRESS NOTE/ RECERT     Patient Name: Toni Medina MRN: [id number] DOB:1931/07/28, 88 y.o., female Today's Date: 01/14/2022  PCP: Seward Carol, MD  REFERRING PROVIDER: Mcarthur Rossetti, MD   Progress Note Reporting Period 12/12/2021 to 01/09/2022  See note below for Objective Data and Assessment of Progress/Goals.      END OF SESSION:               Past Medical History:  Diagnosis Date   Arthritis    Hypothyroidism    Past Surgical History:  Procedure Laterality Date   BREAST EXCISIONAL BIOPSY Left 2014   COLONOSCOPY     MENISCUS REPAIR     TOTAL KNEE ARTHROPLASTY Left 11/01/2021   Procedure: LEFT TOTAL KNEE ARTHROPLASTY;  Surgeon: Mcarthur Rossetti, MD;  Location: [facility];  Service: Orthopedics;  Laterality: Left;   Patient Active Problem List   Diagnosis Date Noted   Status post total left knee replacement 11/01/2021   Unilateral primary osteoarthritis, left knee 09/10/2020    REFERRING DIAG: M17.12 (ICD-10-CM) - Unilateral primary osteoarthritis, left knee Z96.652 (ICD-10-CM) - Status post total left knee replacement   THERAPY DIAG:  No diagnosis found.  Rationale for Evaluation and Treatment Rehabilitation  ONSET DATE: 11/01/21   SUBJECTIVE:   SUBJECTIVE STATEMENT: Pt indicated very little pain in surgery leg.  Pt indicated feeling like the other leg can give her trouble more than surgery leg.  Reported coming up side steps at home with some difficulty.   PERTINENT HISTORY: hypothyroidism  PAIN:  NPRS scale: at worst in last week 3/10 Pain location: Lt knee Pain description: aching Aggravating factors: exercises/bending knee, movement, static positioning for too long Relieving factors: meds, rest, ice  PRECAUTIONS: None  WEIGHT BEARING RESTRICTIONS No  FALLS:  Has patient fallen in last 6 months? No  LIVING ENVIRONMENT: Lives with: lives alone and daughter/son providing  24 hour care for the next few weeks Lives in: House/apartment Stairs: Yes: External: 2 steps; no railings, holds onto door jam; has 8 steps with handrail as well Has following equipment at home: Single point cane, Walker - 2 wheeled, and Electronics engineer  OCCUPATION: retired: Personnel officer  PLOF: Independent and Leisure: cooking and baking, reading, no regular exercise  PATIENT GOALS regain regular function, walk without a cane   OBJECTIVE:   PATIENT SURVEYS:  01/09/2022: FOTO update: 55  11/14/21: FOTO 38 (predicted 52)  COGNITION: 11/14/21 Overall cognitive status: Within functional limits for tasks assessed     SENSATION: 11/14/21 [facility]  EDEMA:  11/14/21 Circumferential: Joint Line: Rt: 37 cm /Lt: 39.5 cm   POSTURE:  11/14/21 rounded shoulders, forward head, and flexed trunk   LOWER EXTREMITY ROM:  Active ROM Right Eval 11/14/21 Left Eval 11/14/21 Left 11/20/21 Left 11/28/21 Left 12/03/21 Left 12/12/21 Left 12/31/21 Left 01/09/2022  Knee flexion 121 85 100 AAROM with strap supine 112 117 120  120 120 AROM in supine heel slide  Knee extension -2 (seated LAQ) -18 (seated LAQ)  -7 (seated LAQ) -7 (seated LAQ) -5 -5 seated LAQ -4 seated AROM in LAQ   (Blank rows = not tested)  Passive ROM Right Eval 11/14/21 Left Eval 11/14/21  Knee flexion  92  Knee extension  -1   (Blank rows = not tested)  LOWER EXTREMITY MMT:  MMT Right Eval 11/14/21 Left Eval 11/14/21 Left 11/26/2021 Left 12/12/21 Left 01/09/2022  Knee flexion  3-/5 5/5 5/5 5/5  Knee extension  3-/5 4/5 4+/5  4+/5   (Blank rows = not tested)   01/09/2022 : Lt SLS 8 seconds prior to loss of balance  GAIT: 01/09/2022:  Ambulated into clinic c SPC use.  Reported no cane use at home.   11/14/21: Distance walked: 100' in clinic Assistive device utilized: Single point cane Level of assistance: Modified independence Comments: decreased stance on Lt, decreased hip/knee flexion on Lt, flexed  trunk    TODAY'S TREATMENT: 01/09/22 Therex: Nustep Lvl 5 8 mins UE/LE Knee extension machine 5 lbs double leg up, Lt leg lowering slowly 2 x 10  Leg press DL 62 lb x15 , then SL 31 lbs x 15 Lt Slantboard stretch bilateral gastroc 3x30 sec Heel and toe raises alternating x 15 c finger tip assist on bar bilateral hands  TherActivity (to improve step navigation) Lateral step down control focus WB on Lt 4 inch step bilateral hand assist on bar x 10 4 inch step WB Lt leg:  step up with Rt hand rail, step down with Lt hand rail to mimic home - x 15 each  01/07/22 Therex: Recumbent bike L3-2 X 6 min Knee extension machine 5# left leg X 10, Lt leg only 5#  X5, then 10# up with both down with Rt only X 5, then 10# DL 2X10 Knee flexion machine bil 20# 2x10 Leg press DL 62# 2X10, then SL 31# 2X10 left, 31# X 5 on Rt then 25# X 10 on Rt Slantboard stretch 3x30 sec Forward step ups onto 6" step with opposite UE support x 15 reps on left, 15 reps on Rt TKE green band 2X10 bilat holding 5 sec Heel and toe raises X 20 Seated SLR 3X10 bilat Sit to stand with Rt leg further back for more weight shift onto Rt no UE support2 X 10 with slow lower   01/02/22 Therex: Nu step L5 X 8 min Knee extension machine 5# left leg 2 X 10, then 10# DL 2 X 10, then 5# up with both down with Rt only Knee flexion machine bil 20# 2x10 Leg press DL 62# 2X10, then SL 31# 2X10 left, 31# X 5 on Rt then 25# X 10 on Rt Slantboard stretch 3x30 sec Forward step ups onto 6" step with opposite UE support x 15 reps on left, 15 reps on Rt Heel and toe raises X 20 Seated SLR 3X10 bilat Sit to stand with Rt leg further back for more weight shift onto Rt no UE support2 X 10 with slow lower  PATIENT EDUCATION:  11/14/21 Education details: HEP Person educated: Patient and Child(ren) Education method: Explanation, Demonstration, and Handouts Education comprehension: verbalized understanding, returned demonstration, and needs  further education   HOME EXERCISE PROGRAM: Access Code: [email or web address] URL: [email or web address] Date: 12/19/2021 Prepared by: Stephanie Matthews  Exercises - Long Sitting Quad Set  - 5-10 x daily - 7 x weekly - 1 sets - 5-10 reps - 5 sec hold - Supine Heel Slide with Strap  - 5-10 x daily - 7 x weekly - 1 sets - 5-10 reps - Seated Knee Extension AROM  - 3-5 x daily - 7 x weekly - 1 sets - 10 reps - 3-5 sec hold - Seated Knee Flexion AAROM  - 3-5 x daily - 7 x weekly - 1 sets - 10 reps - 5-10 sec hold - Supine Knee Extension Mobilization with Weight  - 2-3 x daily - 7 x weekly - 1 sets - 1 reps - 3-5 min hold - Forward Step Up with   Counter Support  - 1 x daily - 7 x weekly - 1 sets - 10 reps - Lateral Step Down  - 1 x daily - 7 x weekly - 1 sets - 10 reps - Seated Straight Leg Raise  - 1 x daily - 7 x weekly - 1 sets - 10 reps - Heel Raises with Counter Support  - 1 x daily - 7 x weekly - 1 sets - 20 reps - Standing Hip Extension with Counter Support  - 1 x daily - 7 x weekly - 1 sets - 20 reps - Standing Hip Abduction with Counter Support  - 1 x daily - 7 x weekly - 1 sets - 20 reps - Tandem Stance on Foam Pad with Eyes Open  - 1 x daily - 7 x weekly - 1 sets - 2-3 reps - 15-30 sec hold - Supine Quadriceps Stretch with Strap on Table  - 1 x daily - 7 x weekly - 1 sets - 2-3 reps - 30 sec hold  ASSESSMENT:  CLINICAL IMPRESSION: Pt has attended 16 visits overall during course of treatment.  See objective data for updated information.  Gains have been noted towards goals at this time but mild impairments still present. Pt demonstrated medical necessity for continued skilled PT services to address remaining strength, balance and ambulation independence goals.    OBJECTIVE IMPAIRMENTS Abnormal gait, decreased activity tolerance, decreased balance, decreased mobility, difficulty walking, decreased ROM, decreased strength, increased edema, increased fascial restrictions, impaired  flexibility, postural dysfunction, and pain.   ACTIVITY LIMITATIONS carrying, lifting, bending, sitting, standing, squatting, sleeping, stairs, transfers, bed mobility, and locomotion level  PARTICIPATION LIMITATIONS: meal prep, cleaning, laundry, driving, shopping, community activity, and occupation  PERSONAL FACTORS 1 comorbidity: hypothyroidism  are also affecting patient's functional outcome.   REHAB POTENTIAL: Good  CLINICAL DECISION MAKING: Stable/uncomplicated  EVALUATION COMPLEXITY: Low   GOALS: Goals reviewed with patient? Yes  SHORT TERM GOALS: Target date: 12/12/2021  Independent with initial HEP Goal status:MET 12/17/21  2.  Lt knee AROM 0-10-100 for improved function. Goal status:MET 11/28/21  LONG TERM GOALS: Target date: 02/20/2022  Independent with final HEP Goal status: revised 01/09/2022  2.  FOTO score improved to 52 Goal status: Met 01/09/2022  3.  Lt knee AROM improved to 0-110 for improved mobility and function Goal status:  revised 01/09/2022  4.  Report pain < 2/10 with standing/walking activities for improved function Goal status:  revised 01/09/2022  5.  Amb without AD without significant deviations for improved function. Goal status:  revised 01/09/2022  6. Patient will demonstrate ascending/descending 4 stairs reciprocally s UE assist for household entry.  Goal Status: new 127/2023   PLAN: PT FREQUENCY: 2x/week  PT DURATION: 6 weeks  PLANNED INTERVENTIONS: Therapeutic exercises, Therapeutic activity, Neuromuscular re-education, Balance training, Gait training, Patient/Family education, Self Care, Joint mobilization, Stair training, DME instructions, Aquatic Therapy, Dry Needling, Electrical stimulation, Cryotherapy, Moist heat, scar mobilization, Taping, Vasopneumatic device, Ultrasound, Ionotophoresis 46m/ml Dexamethasone, Manual therapy, and Re-evaluation  PLAN FOR NEXT SESSION:  Progressive strengthening, stair control improvements.  KX  needed after 15 visits with medical necessity  SRudi HeapPT, DPT 01/14/22  1:03 PM

## 2022-01-16 ENCOUNTER — Encounter: Payer: Self-pay | Admitting: Physical Therapy

## 2022-01-16 ENCOUNTER — Ambulatory Visit (INDEPENDENT_AMBULATORY_CARE_PROVIDER_SITE_OTHER): Payer: Medicare Other | Admitting: Physical Therapy

## 2022-01-16 DIAGNOSIS — M6281 Muscle weakness (generalized): Secondary | ICD-10-CM

## 2022-01-16 DIAGNOSIS — G8929 Other chronic pain: Secondary | ICD-10-CM | POA: Diagnosis not present

## 2022-01-16 DIAGNOSIS — M25662 Stiffness of left knee, not elsewhere classified: Secondary | ICD-10-CM

## 2022-01-16 DIAGNOSIS — R2689 Other abnormalities of gait and mobility: Secondary | ICD-10-CM

## 2022-01-16 DIAGNOSIS — R2681 Unsteadiness on feet: Secondary | ICD-10-CM

## 2022-01-16 DIAGNOSIS — R600 Localized edema: Secondary | ICD-10-CM | POA: Diagnosis not present

## 2022-01-16 DIAGNOSIS — M25562 Pain in left knee: Secondary | ICD-10-CM

## 2022-01-16 NOTE — Therapy (Signed)
OUTPATIENT PHYSICAL THERAPY TREATMENT    Patient Name: Toni Medina MRN: [id number] DOB:03-05-1931, 88 y.o., female Today's Date: 01/16/2022  PCP: Seward Carol, MD  REFERRING PROVIDER: Mcarthur Rossetti, MD    END OF SESSION:    PT End of Session - 01/16/22 1339     Visit Number 17    Number of Visits 27    Date for PT Re-Evaluation 02/20/22    Authorization Type Medicare/BCBS - KX after 15    Progress Note Due on Visit 25    PT Start Time 1300    PT Stop Time 8756    PT Time Calculation (min) 38 min    Activity Tolerance Patient tolerated treatment well    Behavior During Therapy WFL for tasks assessed/performed             Past Medical History:  Diagnosis Date   Arthritis    Hypothyroidism    Past Surgical History:  Procedure Laterality Date   BREAST EXCISIONAL BIOPSY Left 2014   COLONOSCOPY     MENISCUS REPAIR     TOTAL KNEE ARTHROPLASTY Left 11/01/2021   Procedure: LEFT TOTAL KNEE ARTHROPLASTY;  Surgeon: Mcarthur Rossetti, MD;  Location: [facility];  Service: Orthopedics;  Laterality: Left;   Patient Active Problem List   Diagnosis Date Noted   Status post total left knee replacement 11/01/2021   Unilateral primary osteoarthritis, left knee 09/10/2020    REFERRING DIAG: M17.12 (ICD-10-CM) - Unilateral primary osteoarthritis, left knee Z96.652 (ICD-10-CM) - Status post total left knee replacement   THERAPY DIAG:  Chronic pain of left knee  Unsteadiness on feet  Muscle weakness (generalized)  Other abnormalities of gait and mobility  Stiffness of left knee, not elsewhere classified  Localized edema  Rationale for Evaluation and Treatment Rehabilitation  ONSET DATE: 11/01/21   SUBJECTIVE:   SUBJECTIVE STATEMENT: ",My left knee is improving and my right knee is lazy".   PERTINENT HISTORY: hypothyroidism  PAIN:  NPRS scale: at worst in last week 3/10 Pain location: Lt knee Pain description: aching Aggravating factors:  exercises/bending knee, movement, static positioning for too long Relieving factors: meds, rest, ice  PRECAUTIONS: None  WEIGHT BEARING RESTRICTIONS No  FALLS:  Has patient fallen in last 6 months? No  LIVING ENVIRONMENT: Lives with: lives alone and daughter/son providing 24 hour care for the next few weeks Lives in: House/apartment Stairs: Yes: External: 2 steps; no railings, holds onto door jam; has 8 steps with handrail as well Has following equipment at home: Single point cane, Walker - 2 wheeled, and Electronics engineer  OCCUPATION: retired: Personnel officer  PLOF: Independent and Leisure: cooking and baking, reading, no regular exercise  PATIENT GOALS regain regular function, walk without a cane   OBJECTIVE:   PATIENT SURVEYS:  01/09/2022: FOTO update: 55  11/14/21: FOTO 38 (predicted 52)  COGNITION: 11/14/21 Overall cognitive status: Within functional limits for tasks assessed     SENSATION: 11/14/21 [facility]  EDEMA:  11/14/21 Circumferential: Joint Line: Rt: 37 cm /Lt: 39.5 cm   POSTURE:  11/14/21 rounded shoulders, forward head, and flexed trunk   LOWER EXTREMITY ROM:  Active ROM Right Eval 11/14/21 Left Eval 11/14/21 Left 11/20/21 Left 11/28/21 Left 12/03/21 Left 12/12/21 Left 12/31/21 Left 01/09/2022  Knee flexion 121 85 100 AAROM with strap supine 112 117 120  120 120 AROM in supine heel slide  Knee extension -2 (seated LAQ) -18 (seated LAQ)  -7 (seated LAQ) -7 (seated LAQ) -5 -5 seated LAQ -4 seated  AROM in LAQ   (Blank rows = not tested)  Passive ROM Right Eval 11/14/21 Left Eval 11/14/21  Knee flexion  92  Knee extension  -1   (Blank rows = not tested)  LOWER EXTREMITY MMT:  MMT Right Eval 11/14/21 Left Eval 11/14/21 Left 11/26/2021 Left 12/12/21 Left 01/09/2022  Knee flexion  3-/5 5/5 5/5 5/5  Knee extension  3-/5 4/5 4+/5 4+/5   (Blank rows = not tested)   01/09/2022 : Lt SLS 8 seconds prior to loss of balance  GAIT: 01/09/2022:   Ambulated into clinic c SPC use.  Reported no cane use at home.   11/14/21: Distance walked: 100' in clinic Assistive device utilized: Single point cane Level of assistance: Modified independence Comments: decreased stance on Lt, decreased hip/knee flexion on Lt, flexed trunk    TODAY'S TREATMENT: 01/16/22 Therex: Nustep Lvl 3 8 mins UE/LE Knee flexion machine 20lbs x20 Knee extension machine 5 lbs double leg up, Lt leg lowering slowly 2 x 10  Leg press DL 62 lb x15 , then SL 31 lbs x 15 Lt Slantboard stretch bilateral gastroc 3x30 sec Heel and toe raises alternating x 15 c finger tip assist on bar bilateral hands  TherActivity (to improve step navigation) Lateral step down control focus WB on Lt 6 inch step bilateral hand assist on bar x 10 4 inch step WB Lt leg:  step up with Rt hand rail, step down with Lt hand rail to mimic home - x 15 each  01/09/22 Therex: Nustep Lvl 3 8 mins UE/LE Knee extension machine 5 lbs double leg up, Lt leg lowering slowly 2 x 10  Leg press DL 62 lb x15 , then SL 31 lbs x 15 Lt Slantboard stretch bilateral gastroc 3x30 sec Heel and toe raises alternating x 15 c finger tip assist on bar bilateral hands  TherActivity (to improve step navigation) Lateral step down control focus WB on Lt 4 inch step bilateral hand assist on bar x 10 4 inch step WB Lt leg:  step up with Rt hand rail, step down with Lt hand rail to mimic home - x 15 each  01/07/22 Therex: Recumbent bike L3-2 X 6 min Knee extension machine 5# left leg X 10, Lt leg only 5#  X5, then 10# up with both down with Rt only X 5, then 10# DL 2X10 Knee flexion machine bil 20# 2x10 Leg press DL 62# 2X10, then SL 31# 2X10 left, 31# X 5 on Rt then 25# X 10 on Rt Slantboard stretch 3x30 sec Forward step ups onto 6" step with opposite UE support x 15 reps on left, 15 reps on Rt TKE green band 2X10 bilat holding 5 sec Heel and toe raises X 20 Seated SLR 3X10 bilat Sit to stand with Rt leg further  back for more weight shift onto Rt no UE support2 X 10 with slow lower   01/02/22 Therex: Nu step L5 X 8 min Knee extension machine 5# left leg 2 X 10, then 10# DL 2 X 10, then 5# up with both down with Rt only Knee flexion machine bil 20# 2x10 Leg press DL 62# 2X10, then SL 31# 2X10 left, 31# X 5 on Rt then 25# X 10 on Rt Slantboard stretch 3x30 sec Forward step ups onto 6" step with opposite UE support x 15 reps on left, 15 reps on Rt Heel and toe raises X 20 Seated SLR 3X10 bilat Sit to stand with Rt leg further back for  more weight shift onto Rt no UE support2 X 10 with slow lower  PATIENT EDUCATION:  11/14/21 Education details: HEP Person educated: Patient and Child(ren) Education method: Explanation, Demonstration, and Handouts Education comprehension: verbalized understanding, returned demonstration, and needs further education   HOME EXERCISE PROGRAM: Access Code: [email or web address] URL: [email or web address] Date: 12/19/2021 Prepared by: Faustino Congress  Exercises - Long Sitting Quad Set  - 5-10 x daily - 7 x weekly - 1 sets - 5-10 reps - 5 sec hold - Supine Heel Slide with Strap  - 5-10 x daily - 7 x weekly - 1 sets - 5-10 reps - Seated Knee Extension AROM  - 3-5 x daily - 7 x weekly - 1 sets - 10 reps - 3-5 sec hold - Seated Knee Flexion AAROM  - 3-5 x daily - 7 x weekly - 1 sets - 10 reps - 5-10 sec hold - Supine Knee Extension Mobilization with Weight  - 2-3 x daily - 7 x weekly - 1 sets - 1 reps - 3-5 min hold - Forward Step Up with Counter Support  - 1 x daily - 7 x weekly - 1 sets - 10 reps - Lateral Step Down  - 1 x daily - 7 x weekly - 1 sets - 10 reps - Seated Straight Leg Raise  - 1 x daily - 7 x weekly - 1 sets - 10 reps - Heel Raises with Counter Support  - 1 x daily - 7 x weekly - 1 sets - 20 reps - Standing Hip Extension with Counter Support  - 1 x daily - 7 x weekly - 1 sets - 20 reps - Standing Hip Abduction with Counter Support  - 1 x daily -  7 x weekly - 1 sets - 20 reps - Tandem Stance on Foam Pad with Eyes Open  - 1 x daily - 7 x weekly - 1 sets - 2-3 reps - 15-30 sec hold - Supine Quadriceps Stretch with Strap on Table  - 1 x daily - 7 x weekly - 1 sets - 2-3 reps - 30 sec hold  ASSESSMENT:  CLINICAL IMPRESSION: Pt arrives to PT after a a week off. She continues to report muscle fatigue with increased activity, but has no reported pain. Kept session similar today due to reports of fatigue  requiring intermittent rest breaks. She was able to progress with step up's from 4" to 6" step without difficulty. Pt will continue to benefit from skilled PT to address continued deficits.    OBJECTIVE IMPAIRMENTS Abnormal gait, decreased activity tolerance, decreased balance, decreased mobility, difficulty walking, decreased ROM, decreased strength, increased edema, increased fascial restrictions, impaired flexibility, postural dysfunction, and pain.   ACTIVITY LIMITATIONS carrying, lifting, bending, sitting, standing, squatting, sleeping, stairs, transfers, bed mobility, and locomotion level  PARTICIPATION LIMITATIONS: meal prep, cleaning, laundry, driving, shopping, community activity, and occupation  PERSONAL FACTORS 1 comorbidity: hypothyroidism  are also affecting patient's functional outcome.   REHAB POTENTIAL: Good  CLINICAL DECISION MAKING: Stable/uncomplicated  EVALUATION COMPLEXITY: Low   GOALS: Goals reviewed with patient? Yes  SHORT TERM GOALS: Target date: 12/12/2021  Independent with initial HEP Goal status:MET 12/17/21  2.  Lt knee AROM 0-10-100 for improved function. Goal status:MET 11/28/21  LONG TERM GOALS: Target date: 02/20/2022  Independent with final HEP Goal status: revised 01/09/2022  2.  FOTO score improved to 52 Goal status: Met 01/09/2022  3.  Lt knee AROM improved to 0-110 for improved mobility and function Goal status:  revised 01/09/2022  4.  Report pain < 2/10 with standing/walking  activities for improved function Goal status:  revised 01/09/2022  5.  Amb without AD without significant deviations for improved function. Goal status:  revised 01/09/2022  6. Patient will demonstrate ascending/descending 4 stairs reciprocally s UE assist for household entry.  Goal Status: new 127/2023   PLAN: PT FREQUENCY: 2x/week  PT DURATION: 6 weeks  PLANNED INTERVENTIONS: Therapeutic exercises, Therapeutic activity, Neuromuscular re-education, Balance training, Gait training, Patient/Family education, Self Care, Joint mobilization, Stair training, DME instructions, Aquatic Therapy, Dry Needling, Electrical stimulation, Cryotherapy, Moist heat, scar mobilization, Taping, Vasopneumatic device, Ultrasound, Ionotophoresis 67m/ml Dexamethasone, Manual therapy, and Re-evaluation  PLAN FOR NEXT SESSION:  Progressive strengthening, stair control improvements.  KX needed after 15 visits with medical necessity  SRudi HeapPT, DPT 01/16/22  1:41 PM

## 2022-01-16 NOTE — Therapy (Deleted)
OUTPATIENT PHYSICAL THERAPY TREATMENT /PROGRESS NOTE/ RECERT     Patient Name: Toni Medina MRN: [id number] DOB:08/23/1931, 88 y.o., female Today's Date: 01/16/2022  PCP: Polite, Ronald, MD  REFERRING PROVIDER: Blackman, Christopher Y, MD   Progress Note Reporting Period 12/12/2021 to 01/09/2022  See note below for Objective Data and Assessment of Progress/Goals.      END OF SESSION:               Past Medical History:  Diagnosis Date   Arthritis    Hypothyroidism    Past Surgical History:  Procedure Laterality Date   BREAST EXCISIONAL BIOPSY Left 2014   COLONOSCOPY     MENISCUS REPAIR     TOTAL KNEE ARTHROPLASTY Left 11/01/2021   Procedure: LEFT TOTAL KNEE ARTHROPLASTY;  Surgeon: Blackman, Christopher Y, MD;  Location: [facility];  Service: Orthopedics;  Laterality: Left;   Patient Active Problem List   Diagnosis Date Noted   Status post total left knee replacement 11/01/2021   Unilateral primary osteoarthritis, left knee 09/10/2020    REFERRING DIAG: M17.12 (ICD-10-CM) - Unilateral primary osteoarthritis, left knee Z96.652 (ICD-10-CM) - Status post total left knee replacement   THERAPY DIAG:  No diagnosis found.  Rationale for Evaluation and Treatment Rehabilitation  ONSET DATE: 11/01/21   SUBJECTIVE:   SUBJECTIVE STATEMENT: Pt indicated very little pain in surgery leg.  Pt indicated feeling like the other leg can give her trouble more than surgery leg.  Reported coming up side steps at home with some difficulty.   PERTINENT HISTORY: hypothyroidism  PAIN:  NPRS scale: at worst in last week 3/10 Pain location: Lt knee Pain description: aching Aggravating factors: exercises/bending knee, movement, static positioning for too long Relieving factors: meds, rest, ice  PRECAUTIONS: None  WEIGHT BEARING RESTRICTIONS No  FALLS:  Has patient fallen in last 6 months? No  LIVING ENVIRONMENT: Lives with: lives alone and daughter/son providing  24 hour care for the next few weeks Lives in: House/apartment Stairs: Yes: External: 2 steps; no railings, holds onto door jam; has 8 steps with handrail as well Has following equipment at home: Single point cane, Walker - 2 wheeled, and Shower bench  OCCUPATION: retired: medical technology  PLOF: Independent and Leisure: cooking and baking, reading, no regular exercise  PATIENT GOALS regain regular function, walk without a cane   OBJECTIVE:   PATIENT SURVEYS:  01/09/2022: FOTO update: 55  11/14/21: FOTO 38 (predicted 52)  COGNITION: 11/14/21 Overall cognitive status: Within functional limits for tasks assessed     SENSATION: 11/14/21 WFL  EDEMA:  11/14/21 Circumferential: Joint Line: Rt: 37 cm /Lt: 39.5 cm   POSTURE:  11/14/21 rounded shoulders, forward head, and flexed trunk   LOWER EXTREMITY ROM:  Active ROM Right Eval 11/14/21 Left Eval 11/14/21 Left 11/20/21 Left 11/28/21 Left 12/03/21 Left 12/12/21 Left 12/31/21 Left 01/09/2022  Knee flexion 121 85 100 AAROM with strap supine 112 117 120  120 120 AROM in supine heel slide  Knee extension -2 (seated LAQ) -18 (seated LAQ)  -7 (seated LAQ) -7 (seated LAQ) -5 -5 seated LAQ -4 seated AROM in LAQ   (Blank rows = not tested)  Passive ROM Right Eval 11/14/21 Left Eval 11/14/21  Knee flexion  92  Knee extension  -1   (Blank rows = not tested)  LOWER EXTREMITY MMT:  MMT Right Eval 11/14/21 Left Eval 11/14/21 Left 11/26/2021 Left 12/12/21 Left 01/09/2022  Knee flexion  3-/5 5/5 5/5 5/5  Knee extension  3-/5 4/5 4+/5   4+/5   (Blank rows = not tested)   01/09/2022 : Lt SLS 8 seconds prior to loss of balance  GAIT: 01/09/2022:  Ambulated into clinic c SPC use.  Reported no cane use at home.   11/14/21: Distance walked: 100' in clinic Assistive device utilized: Single point cane Level of assistance: Modified independence Comments: decreased stance on Lt, decreased hip/knee flexion on Lt, flexed  trunk    TODAY'S TREATMENT: 01/09/22 Therex: Nustep Lvl 5 8 mins UE/LE Knee extension machine 5 lbs double leg up, Lt leg lowering slowly 2 x 10  Leg press DL 62 lb x15 , then SL 31 lbs x 15 Lt Slantboard stretch bilateral gastroc 3x30 sec Heel and toe raises alternating x 15 c finger tip assist on bar bilateral hands  TherActivity (to improve step navigation) Lateral step down control focus WB on Lt 4 inch step bilateral hand assist on bar x 10 4 inch step WB Lt leg:  step up with Rt hand rail, step down with Lt hand rail to mimic home - x 15 each  01/07/22 Therex: Recumbent bike L3-2 X 6 min Knee extension machine 5# left leg X 10, Lt leg only 5#  X5, then 10# up with both down with Rt only X 5, then 10# DL 2X10 Knee flexion machine bil 20# 2x10 Leg press DL 62# 2X10, then SL 31# 2X10 left, 31# X 5 on Rt then 25# X 10 on Rt Slantboard stretch 3x30 sec Forward step ups onto 6" step with opposite UE support x 15 reps on left, 15 reps on Rt TKE green band 2X10 bilat holding 5 sec Heel and toe raises X 20 Seated SLR 3X10 bilat Sit to stand with Rt leg further back for more weight shift onto Rt no UE support2 X 10 with slow lower   01/02/22 Therex: Nu step L5 X 8 min Knee extension machine 5# left leg 2 X 10, then 10# DL 2 X 10, then 5# up with both down with Rt only Knee flexion machine bil 20# 2x10 Leg press DL 62# 2X10, then SL 31# 2X10 left, 31# X 5 on Rt then 25# X 10 on Rt Slantboard stretch 3x30 sec Forward step ups onto 6" step with opposite UE support x 15 reps on left, 15 reps on Rt Heel and toe raises X 20 Seated SLR 3X10 bilat Sit to stand with Rt leg further back for more weight shift onto Rt no UE support2 X 10 with slow lower  PATIENT EDUCATION:  11/14/21 Education details: HEP Person educated: Patient and Child(ren) Education method: Explanation, Demonstration, and Handouts Education comprehension: verbalized understanding, returned demonstration, and needs  further education   HOME EXERCISE PROGRAM: Access Code: [email or web address] URL: [email or web address] Date: 12/19/2021 Prepared by: Stephanie Matthews  Exercises - Long Sitting Quad Set  - 5-10 x daily - 7 x weekly - 1 sets - 5-10 reps - 5 sec hold - Supine Heel Slide with Strap  - 5-10 x daily - 7 x weekly - 1 sets - 5-10 reps - Seated Knee Extension AROM  - 3-5 x daily - 7 x weekly - 1 sets - 10 reps - 3-5 sec hold - Seated Knee Flexion AAROM  - 3-5 x daily - 7 x weekly - 1 sets - 10 reps - 5-10 sec hold - Supine Knee Extension Mobilization with Weight  - 2-3 x daily - 7 x weekly - 1 sets - 1 reps - 3-5 min hold - Forward Step Up with   Counter Support  - 1 x daily - 7 x weekly - 1 sets - 10 reps - Lateral Step Down  - 1 x daily - 7 x weekly - 1 sets - 10 reps - Seated Straight Leg Raise  - 1 x daily - 7 x weekly - 1 sets - 10 reps - Heel Raises with Counter Support  - 1 x daily - 7 x weekly - 1 sets - 20 reps - Standing Hip Extension with Counter Support  - 1 x daily - 7 x weekly - 1 sets - 20 reps - Standing Hip Abduction with Counter Support  - 1 x daily - 7 x weekly - 1 sets - 20 reps - Tandem Stance on Foam Pad with Eyes Open  - 1 x daily - 7 x weekly - 1 sets - 2-3 reps - 15-30 sec hold - Supine Quadriceps Stretch with Strap on Table  - 1 x daily - 7 x weekly - 1 sets - 2-3 reps - 30 sec hold  ASSESSMENT:  CLINICAL IMPRESSION: Pt has attended 16 visits overall during course of treatment.  See objective data for updated information.  Gains have been noted towards goals at this time but mild impairments still present. Pt demonstrated medical necessity for continued skilled PT services to address remaining strength, balance and ambulation independence goals.    OBJECTIVE IMPAIRMENTS Abnormal gait, decreased activity tolerance, decreased balance, decreased mobility, difficulty walking, decreased ROM, decreased strength, increased edema, increased fascial restrictions, impaired  flexibility, postural dysfunction, and pain.   ACTIVITY LIMITATIONS carrying, lifting, bending, sitting, standing, squatting, sleeping, stairs, transfers, bed mobility, and locomotion level  PARTICIPATION LIMITATIONS: meal prep, cleaning, laundry, driving, shopping, community activity, and occupation  PERSONAL FACTORS 1 comorbidity: hypothyroidism  are also affecting patient's functional outcome.   REHAB POTENTIAL: Good  CLINICAL DECISION MAKING: Stable/uncomplicated  EVALUATION COMPLEXITY: Low   GOALS: Goals reviewed with patient? Yes  SHORT TERM GOALS: Target date: 12/12/2021  Independent with initial HEP Goal status:MET 12/17/21  2.  Lt knee AROM 0-10-100 for improved function. Goal status:MET 11/28/21  LONG TERM GOALS: Target date: 02/20/2022  Independent with final HEP Goal status: revised 01/09/2022  2.  FOTO score improved to 52 Goal status: Met 01/09/2022  3.  Lt knee AROM improved to 0-110 for improved mobility and function Goal status:  revised 01/09/2022  4.  Report pain < 2/10 with standing/walking activities for improved function Goal status:  revised 01/09/2022  5.  Amb without AD without significant deviations for improved function. Goal status:  revised 01/09/2022  6. Patient will demonstrate ascending/descending 4 stairs reciprocally s UE assist for household entry.  Goal Status: new 127/2023   PLAN: PT FREQUENCY: 2x/week  PT DURATION: 6 weeks  PLANNED INTERVENTIONS: Therapeutic exercises, Therapeutic activity, Neuromuscular re-education, Balance training, Gait training, Patient/Family education, Self Care, Joint mobilization, Stair training, DME instructions, Aquatic Therapy, Dry Needling, Electrical stimulation, Cryotherapy, Moist heat, scar mobilization, Taping, Vasopneumatic device, Ultrasound, Ionotophoresis 76m/ml Dexamethasone, Manual therapy, and Re-evaluation  PLAN FOR NEXT SESSION:  Progressive strengthening, stair control improvements.  KX  needed after 15 visits with medical necessity  SRudi HeapPT, DPT 01/16/22  12:32 PM

## 2022-01-21 ENCOUNTER — Encounter: Payer: Self-pay | Admitting: Rehabilitative and Restorative Service Providers"

## 2022-01-21 ENCOUNTER — Ambulatory Visit (INDEPENDENT_AMBULATORY_CARE_PROVIDER_SITE_OTHER): Payer: Medicare Other | Admitting: Rehabilitative and Restorative Service Providers"

## 2022-01-21 DIAGNOSIS — R600 Localized edema: Secondary | ICD-10-CM | POA: Diagnosis not present

## 2022-01-21 DIAGNOSIS — R2681 Unsteadiness on feet: Secondary | ICD-10-CM | POA: Diagnosis not present

## 2022-01-21 DIAGNOSIS — R2689 Other abnormalities of gait and mobility: Secondary | ICD-10-CM | POA: Diagnosis not present

## 2022-01-21 DIAGNOSIS — M25662 Stiffness of left knee, not elsewhere classified: Secondary | ICD-10-CM | POA: Diagnosis not present

## 2022-01-21 DIAGNOSIS — M6281 Muscle weakness (generalized): Secondary | ICD-10-CM | POA: Diagnosis not present

## 2022-01-21 DIAGNOSIS — G8929 Other chronic pain: Secondary | ICD-10-CM | POA: Diagnosis not present

## 2022-01-21 DIAGNOSIS — M25562 Pain in left knee: Secondary | ICD-10-CM

## 2022-01-21 NOTE — Therapy (Signed)
OUTPATIENT PHYSICAL THERAPY TREATMENT    Patient Name: Toni Medina MRN: [id number] DOB:[birth year], 88 y.o., female Today's Date: 01/21/2022  PCP: Seward Carol, MD  REFERRING PROVIDER: Mcarthur Rossetti, MD    END OF SESSION:    PT End of Session - 01/21/22 1322     Visit Number 18    Number of Visits 27    Date for PT Re-Evaluation 02/20/22    Authorization Type Medicare/BCBS - KX after 15    Progress Note Due on Visit 25    PT Start Time 1303    PT Stop Time 1342    PT Time Calculation (min) 39 min    Activity Tolerance Patient tolerated treatment well    Behavior During Therapy WFL for tasks assessed/performed              Past Medical History:  Diagnosis Date   Arthritis    Hypothyroidism    Past Surgical History:  Procedure Laterality Date   BREAST EXCISIONAL BIOPSY Left 2014   COLONOSCOPY     MENISCUS REPAIR     TOTAL KNEE ARTHROPLASTY Left 11/01/2021   Procedure: LEFT TOTAL KNEE ARTHROPLASTY;  Surgeon: Mcarthur Rossetti, MD;  Location: [facility];  Service: Orthopedics;  Laterality: Left;   Patient Active Problem List   Diagnosis Date Noted   Status post total left knee replacement 11/01/2021   Unilateral primary osteoarthritis, left knee 09/10/2020    REFERRING DIAG: M17.12 (ICD-10-CM) - Unilateral primary osteoarthritis, left knee Z96.652 (ICD-10-CM) - Status post total left knee replacement   THERAPY DIAG:  Chronic pain of left knee  Unsteadiness on feet  Muscle weakness (generalized)  Other abnormalities of gait and mobility  Stiffness of left knee, not elsewhere classified  Localized edema  Rationale for Evaluation and Treatment Rehabilitation  ONSET DATE: 11/01/21   SUBJECTIVE:   SUBJECTIVE STATEMENT: Pt indicated some complaints of pain in low back and Rt leg.   PERTINENT HISTORY: hypothyroidism  PAIN:  NPRS scale: a very small amount.  Pain location: Lt knee Pain description: aching Aggravating factors:  exercises/bending knee, movement, static positioning for too long Relieving factors: meds, rest, ice  PRECAUTIONS: None  WEIGHT BEARING RESTRICTIONS No  FALLS:  Has patient fallen in last 6 months? No  LIVING ENVIRONMENT: Lives with: lives alone and daughter/son providing 24 hour care for the next few weeks Lives in: House/apartment Stairs: Yes: External: 2 steps; no railings, holds onto door jam; has 8 steps with handrail as well Has following equipment at home: Single point cane, Walker - 2 wheeled, and Electronics engineer  OCCUPATION: retired: Personnel officer  PLOF: Independent and Leisure: cooking and baking, reading, no regular exercise  PATIENT GOALS regain regular function, walk without a cane   OBJECTIVE:   PATIENT SURVEYS:  01/09/2022: FOTO update: 55  11/14/21: FOTO 38 (predicted 52)  COGNITION: 11/14/21 Overall cognitive status: Within functional limits for tasks assessed     SENSATION: 11/14/21 [facility]  EDEMA:  11/14/21 Circumferential: Joint Line: Rt: 37 cm /Lt: 39.5 cm   POSTURE:  11/14/21 rounded shoulders, forward head, and flexed trunk   LOWER EXTREMITY ROM:  Active ROM Right Eval 11/14/21 Left Eval 11/14/21 Left 11/20/21 Left 11/28/21 Left 12/03/21 Left 12/12/21 Left 12/31/21 Left 01/09/2022  Knee flexion 121 85 100 AAROM with strap supine 112 117 120  120 120 AROM in supine heel slide  Knee extension -2 (seated LAQ) -18 (seated LAQ)  -7 (seated LAQ) -7 (seated LAQ) -5 -5 seated LAQ -4  seated AROM in LAQ   (Blank rows = not tested)  Passive ROM Right Eval 11/14/21 Left Eval 11/14/21  Knee flexion  92  Knee extension  -1   (Blank rows = not tested)  LOWER EXTREMITY MMT:  MMT Right Eval 11/14/21 Left Eval 11/14/21 Left 11/26/2021 Left 12/12/21 Left 01/09/2022 Left 01/21/2022  Knee flexion  3-/5 5/5 5/5 5/5 5/5  Knee extension  3-/5 4/5 4+/5 4+/5 5/5   (Blank rows = not tested)   01/09/2022 : Lt SLS 8 seconds prior to loss of  balance  GAIT: 01/21/2022:  Ambulation in clinic independent c forward trunk lean.   01/09/2022:  Ambulated into clinic c SPC use.  Reported no cane use at home.   11/14/21: Distance walked: 100' in clinic Assistive device utilized: Single point cane Level of assistance: Modified independence Comments: decreased stance on Lt, decreased hip/knee flexion on Lt, flexed trunk    TODAY'S TREATMENT: 01/21/2022 Therex: Nustep Lvl 6 7 mins LE Knee extension machine 5 lbs double leg up, Lt leg lowering slowly 2 x 10  Heel and toe raises alternating x 15 c finger tip assist on bar bilateral hands  Additional time spent on technique education for intervention.   TherActivity (to improve step navigation, transfers) 6 inch step on over and down WB on Lt leg with single hand rail assist x 20 Sit to stand to sit slow lowering focus x 10 - no UE 18 inch chair  Leg press DL 62 lb x17 , then SL 31 lbs x15 Lt  01/16/22 Therex: Nustep Lvl 3 8 mins UE/LE Knee flexion machine 20lbs x20 Knee extension machine 5 lbs double leg up, Lt leg lowering slowly 2 x 10  Leg press DL 62 lb x15 , then SL 31 lbs x 15 Lt Slantboard stretch bilateral gastroc 3x30 sec Heel and toe raises alternating x 15 c finger tip assist on bar bilateral hands  TherActivity (to improve step navigation) Lateral step down control focus WB on Lt 6 inch step bilateral hand assist on bar x 10 4 inch step WB Lt leg:  step up with Rt hand rail, step down with Lt hand rail to mimic home - x 15 each  PATIENT EDUCATION:  11/14/21 Education details: HEP Person educated: Patient and Child(ren) Education method: Explanation, Demonstration, and Handouts Education comprehension: verbalized understanding, returned demonstration, and needs further education   HOME EXERCISE PROGRAM: Access Code: [email or web address] URL: [email or web address] Date: 12/19/2021 Prepared by: Faustino Congress  Exercises - Long Sitting Quad Set  -  5-10 x daily - 7 x weekly - 1 sets - 5-10 reps - 5 sec hold - Supine Heel Slide with Strap  - 5-10 x daily - 7 x weekly - 1 sets - 5-10 reps - Seated Knee Extension AROM  - 3-5 x daily - 7 x weekly - 1 sets - 10 reps - 3-5 sec hold - Seated Knee Flexion AAROM  - 3-5 x daily - 7 x weekly - 1 sets - 10 reps - 5-10 sec hold - Supine Knee Extension Mobilization with Weight  - 2-3 x daily - 7 x weekly - 1 sets - 1 reps - 3-5 min hold - Forward Step Up with Counter Support  - 1 x daily - 7 x weekly - 1 sets - 10 reps - Lateral Step Down  - 1 x daily - 7 x weekly - 1 sets - 10 reps - Seated Straight Leg Raise  - 1 x daily - 7  x weekly - 1 sets - 10 reps - Heel Raises with Counter Support  - 1 x daily - 7 x weekly - 1 sets - 20 reps - Standing Hip Extension with Counter Support  - 1 x daily - 7 x weekly - 1 sets - 20 reps - Standing Hip Abduction with Counter Support  - 1 x daily - 7 x weekly - 1 sets - 20 reps - Tandem Stance on Foam Pad with Eyes Open  - 1 x daily - 7 x weekly - 1 sets - 2-3 reps - 15-30 sec hold - Supine Quadriceps Stretch with Strap on Table  - 1 x daily - 7 x weekly - 1 sets - 2-3 reps - 30 sec hold  ASSESSMENT:  CLINICAL IMPRESSION: Medical necessity for continued treatment to improve confidence and knowledge of HEP as well as improving stability and posture in ambulation to improve confidence and performance of independent ambulation in community.    OBJECTIVE IMPAIRMENTS Abnormal gait, decreased activity tolerance, decreased balance, decreased mobility, difficulty walking, decreased ROM, decreased strength, increased edema, increased fascial restrictions, impaired flexibility, postural dysfunction, and pain.   ACTIVITY LIMITATIONS carrying, lifting, bending, sitting, standing, squatting, sleeping, stairs, transfers, bed mobility, and locomotion level  PARTICIPATION LIMITATIONS: meal prep, cleaning, laundry, driving, shopping, community activity, and occupation  PERSONAL  FACTORS 1 comorbidity: hypothyroidism  are also affecting patient's functional outcome.   REHAB POTENTIAL: Good  CLINICAL DECISION MAKING: Stable/uncomplicated  EVALUATION COMPLEXITY: Low   GOALS: Goals reviewed with patient? Yes  SHORT TERM GOALS: Target date: 12/12/2021  Independent with initial HEP Goal status:MET 12/17/21  2.  Lt knee AROM 0-10-100 for improved function. Goal status:MET 11/28/21  LONG TERM GOALS: Target date: 02/20/2022  Independent with final HEP Goal status: on going 01/21/2022  2.  FOTO score improved to 52 Goal status: Met 01/09/2022  3.  Lt knee AROM improved to 0-110 for improved mobility and function Goal status: on going 01/21/2022  4.  Report pain < 2/10 with standing/walking activities for improved function Goal status: on going 01/21/2022  5.  Amb without AD without significant deviations for improved function. Goal status:  on going 01/21/2022  6. Patient will demonstrate ascending/descending 4 stairs reciprocally s UE assist for household entry.  Goal Status: on going 01/21/2022   PLAN: PT FREQUENCY: 2x/week  PT DURATION: 6 weeks  PLANNED INTERVENTIONS: Therapeutic exercises, Therapeutic activity, Neuromuscular re-education, Balance training, Gait training, Patient/Family education, Self Care, Joint mobilization, Stair training, DME instructions, Aquatic Therapy, Dry Needling, Electrical stimulation, Cryotherapy, Moist heat, scar mobilization, Taping, Vasopneumatic device, Ultrasound, Ionotophoresis 8m/ml Dexamethasone, Manual therapy, and Re-evaluation  PLAN FOR NEXT SESSION:  Continue education and progression towards transitioning to HEP.  KX needed after 15 visits with medical necessity  MScot Jun PT, DPT, OCS, ATC 01/21/22  1:39 PM

## 2022-01-22 ENCOUNTER — Encounter: Payer: Medicare Other | Admitting: Rehabilitative and Restorative Service Providers"

## 2022-01-24 ENCOUNTER — Encounter: Payer: Medicare Other | Admitting: Physical Therapy

## 2022-01-29 ENCOUNTER — Ambulatory Visit (INDEPENDENT_AMBULATORY_CARE_PROVIDER_SITE_OTHER): Payer: Medicare Other | Admitting: Physical Therapy

## 2022-01-29 DIAGNOSIS — R600 Localized edema: Secondary | ICD-10-CM | POA: Diagnosis not present

## 2022-01-29 DIAGNOSIS — G8929 Other chronic pain: Secondary | ICD-10-CM | POA: Diagnosis not present

## 2022-01-29 DIAGNOSIS — R2681 Unsteadiness on feet: Secondary | ICD-10-CM | POA: Diagnosis not present

## 2022-01-29 DIAGNOSIS — M6281 Muscle weakness (generalized): Secondary | ICD-10-CM

## 2022-01-29 DIAGNOSIS — M25662 Stiffness of left knee, not elsewhere classified: Secondary | ICD-10-CM

## 2022-01-29 DIAGNOSIS — M25562 Pain in left knee: Secondary | ICD-10-CM

## 2022-01-29 DIAGNOSIS — R2689 Other abnormalities of gait and mobility: Secondary | ICD-10-CM

## 2022-01-29 NOTE — Therapy (Signed)
OUTPATIENT PHYSICAL THERAPY TREATMENT    Patient Name: Toni Medina MRN: [id number] DOB:July 23, 1931, 88 y.o., female Today's Date: 01/29/2022  PCP: Seward Carol, MD  REFERRING PROVIDER: Mcarthur Rossetti, MD    END OF SESSION:    PT End of Session - 01/29/22 1314     Visit Number 19    Number of Visits 27    Date for PT Re-Evaluation 02/20/22    Authorization Type Medicare/BCBS - KX after 15    Progress Note Due on Visit 25    PT Start Time 1306    PT Stop Time 1345    PT Time Calculation (min) 39 min    Activity Tolerance Patient tolerated treatment well    Behavior During Therapy WFL for tasks assessed/performed              Past Medical History:  Diagnosis Date   Arthritis    Hypothyroidism    Past Surgical History:  Procedure Laterality Date   BREAST EXCISIONAL BIOPSY Left 2014   COLONOSCOPY     MENISCUS REPAIR     TOTAL KNEE ARTHROPLASTY Left 11/01/2021   Procedure: LEFT TOTAL KNEE ARTHROPLASTY;  Surgeon: Mcarthur Rossetti, MD;  Location: [facility];  Service: Orthopedics;  Laterality: Left;   Patient Active Problem List   Diagnosis Date Noted   Status post total left knee replacement 11/01/2021   Unilateral primary osteoarthritis, left knee 09/10/2020    REFERRING DIAG: M17.12 (ICD-10-CM) - Unilateral primary osteoarthritis, left knee Z96.652 (ICD-10-CM) - Status post total left knee replacement   THERAPY DIAG:  Chronic pain of left knee  Unsteadiness on feet  Muscle weakness (generalized)  Other abnormalities of gait and mobility  Stiffness of left knee, not elsewhere classified  Localized edema  Rationale for Evaluation and Treatment Rehabilitation  ONSET DATE: 11/01/21   SUBJECTIVE:   SUBJECTIVE STATEMENT: Pt indicated some complaints of pain in low back today, her left knee is doing well  PERTINENT HISTORY: hypothyroidism  PAIN:  NPRS scale: a very small amount.  Pain location: Lt knee Pain description:  aching Aggravating factors: exercises/bending knee, movement, static positioning for too long Relieving factors: meds, rest, ice  PRECAUTIONS: None  WEIGHT BEARING RESTRICTIONS No  FALLS:  Has patient fallen in last 6 months? No  LIVING ENVIRONMENT: Lives with: lives alone and daughter/son providing 24 hour care for the next few weeks Lives in: House/apartment Stairs: Yes: External: 2 steps; no railings, holds onto door jam; has 8 steps with handrail as well Has following equipment at home: Single point cane, Walker - 2 wheeled, and Electronics engineer  OCCUPATION: retired: Personnel officer  PLOF: Independent and Leisure: cooking and baking, reading, no regular exercise  PATIENT GOALS regain regular function, walk without a cane   OBJECTIVE:   PATIENT SURVEYS:  01/09/2022: FOTO update: 55  11/14/21: FOTO 38 (predicted 52)  COGNITION: 11/14/21 Overall cognitive status: Within functional limits for tasks assessed     SENSATION: 11/14/21 [facility]  EDEMA:  11/14/21 Circumferential: Joint Line: Rt: 37 cm /Lt: 39.5 cm   POSTURE:  11/14/21 rounded shoulders, forward head, and flexed trunk   LOWER EXTREMITY ROM:  Active ROM Right Eval 11/14/21 Left Eval 11/14/21 Left 11/20/21 Left 11/28/21 Left 12/03/21 Left 12/12/21 Left 12/31/21 Left 01/09/2022  Knee flexion 121 85 100 AAROM with strap supine 112 117 120  120 120 AROM in supine heel slide  Knee extension -2 (seated LAQ) -18 (seated LAQ)  -7 (seated LAQ) -7 (seated LAQ) -5 -5  seated LAQ -4 seated AROM in LAQ   (Blank rows = not tested)  Passive ROM Right Eval 11/14/21 Left Eval 11/14/21  Knee flexion  92  Knee extension  -1   (Blank rows = not tested)  LOWER EXTREMITY MMT:  MMT Right Eval 11/14/21 Left Eval 11/14/21 Left 11/26/2021 Left 12/12/21 Left 01/09/2022 Left 01/21/2022  Knee flexion  3-/5 5/5 5/5 5/5 5/5  Knee extension  3-/5 4/5 4+/5 4+/5 5/5   (Blank rows = not tested)   01/09/2022 : Lt SLS 8  seconds prior to loss of balance  GAIT: 01/21/2022:  Ambulation in clinic independent c forward trunk lean.   01/09/2022:  Ambulated into clinic c SPC use.  Reported no cane use at home.   11/14/21: Distance walked: 100' in clinic Assistive device utilized: Single point cane Level of assistance: Modified independence Comments: decreased stance on Lt, decreased hip/knee flexion on Lt, flexed trunk    TODAY'S TREATMENT: 01/29/2022 Therex: Nustep Lvl 6 8 mins LE Knee extension machine 10# DL X 10 then 5#l double leg and SL lowering slowly X 10 each leg Seated SLR 2X10 bilat HEP review   TherActivity (to improve step navigation, transfers) 6 inch step on over and down in frontt single hand rail assist x 10 bilat Sit to stand to sit slow lowering focus x 10 - no UE 18 inch chair  Leg press DL 68 lb x15 , then SL 31 lbs x15 Lt, 31# X 5 on Rt then 25# X 15 on Rt Squats with UE support X 10 bilat  01/21/2022 Therex: Nustep Lvl 6 7 mins LE Knee extension machine 5 lbs double leg up, Lt leg lowering slowly 2 x 10  Heel and toe raises alternating x 15 c finger tip assist on bar bilateral hands  Additional time spent on technique education for intervention.   TherActivity (to improve step navigation, transfers) 6 inch step on over and down WB on Lt leg with single hand rail assist x 20 Sit to stand to sit slow lowering focus x 10 - no UE 18 inch chair  Leg press DL 62 lb x17 , then SL 31 lbs x15 Lt   PATIENT EDUCATION:  11/14/21 Education details: HEP Person educated: Patient and Child(ren) Education method: Explanation, Demonstration, and Handouts Education comprehension: verbalized understanding, returned demonstration, and needs further education   HOME EXERCISE PROGRAM: Access Code: [email or web address] URL: [email or web address] Date: 01/29/2022 Prepared by: Elsie Ra  Exercises - Seated Knee Flexion AAROM  - 3-5 x daily - 7 x weekly - 1 sets - 10 reps - 5-10  sec hold - Sit to Stand Without Arm Support  - 2 x daily - 6 x weekly - 1-2 sets - 10 reps - Forward Step Up with Counter Support  - 1 x daily - 7 x weekly - 1 sets - 10 reps - Seated Straight Leg Raise  - 1 x daily - 7 x weekly - 1 sets - 10 reps - Supine Quadriceps Stretch with Strap on Table  - 1 x daily - 7 x weekly - 1 sets - 2-3 reps - 30 sec hold ASSESSMENT:  CLINICAL IMPRESSION: I condensed her HEP and provided new print out of the most important ones to focus on at home for continued knee strength and ROM. I reviewed this with her and her daughter for understanding.  I feel it Medical necessity for up to 3 more visits improve confidence and knowledge of HEP as well as improving knee  strength/stability for ambulation and stairs.    OBJECTIVE IMPAIRMENTS Abnormal gait, decreased activity tolerance, decreased balance, decreased mobility, difficulty walking, decreased ROM, decreased strength, increased edema, increased fascial restrictions, impaired flexibility, postural dysfunction, and pain.   ACTIVITY LIMITATIONS carrying, lifting, bending, sitting, standing, squatting, sleeping, stairs, transfers, bed mobility, and locomotion level  PARTICIPATION LIMITATIONS: meal prep, cleaning, laundry, driving, shopping, community activity, and occupation  PERSONAL FACTORS 1 comorbidity: hypothyroidism  are also affecting patient's functional outcome.   REHAB POTENTIAL: Good  CLINICAL DECISION MAKING: Stable/uncomplicated  EVALUATION COMPLEXITY: Low   GOALS: Goals reviewed with patient? Yes  SHORT TERM GOALS: Target date: 12/12/2021  Independent with initial HEP Goal status:MET 12/17/21  2.  Lt knee AROM 0-10-100 for improved function. Goal status:MET 11/28/21  LONG TERM GOALS: Target date: 02/20/2022  Independent with final HEP Goal status: on going 01/21/2022  2.  FOTO score improved to 52 Goal status: Met 01/09/2022  3.  Lt knee AROM improved to 0-110 for improved mobility and  function Goal status: on going 01/21/2022  4.  Report pain < 2/10 with standing/walking activities for improved function Goal status: on going 01/21/2022  5.  Amb without AD without significant deviations for improved function. Goal status:  on going 01/21/2022  6. Patient will demonstrate ascending/descending 4 stairs reciprocally s UE assist for household entry.  Goal Status: on going 01/21/2022   PLAN: PT FREQUENCY: 2x/week  PT DURATION: 6 weeks  PLANNED INTERVENTIONS: Therapeutic exercises, Therapeutic activity, Neuromuscular re-education, Balance training, Gait training, Patient/Family education, Self Care, Joint mobilization, Stair training, DME instructions, Aquatic Therapy, Dry Needling, Electrical stimulation, Cryotherapy, Moist heat, scar mobilization, Taping, Vasopneumatic device, Ultrasound, Ionotophoresis 89m/ml Dexamethasone, Manual therapy, and Re-evaluation  PLAN FOR NEXT SESSION:  Continue education and progression towards transitioning to HEP over remaining visits. KX needed after 15 visits with medical necessity  BElsie Ra PT, DPT 01/29/22 2:14 PM

## 2022-01-30 ENCOUNTER — Encounter: Payer: Self-pay | Admitting: Physical Therapy

## 2022-01-30 ENCOUNTER — Ambulatory Visit (INDEPENDENT_AMBULATORY_CARE_PROVIDER_SITE_OTHER): Payer: Medicare Other | Admitting: Physical Therapy

## 2022-01-30 DIAGNOSIS — R2681 Unsteadiness on feet: Secondary | ICD-10-CM

## 2022-01-30 DIAGNOSIS — M25562 Pain in left knee: Secondary | ICD-10-CM | POA: Diagnosis not present

## 2022-01-30 DIAGNOSIS — M6281 Muscle weakness (generalized): Secondary | ICD-10-CM

## 2022-01-30 DIAGNOSIS — G8929 Other chronic pain: Secondary | ICD-10-CM

## 2022-01-30 DIAGNOSIS — R2689 Other abnormalities of gait and mobility: Secondary | ICD-10-CM

## 2022-01-30 NOTE — Therapy (Signed)
OUTPATIENT PHYSICAL THERAPY TREATMENT    Patient Name: Toni Medina MRN: [id number] DOB:[birth year], 88 y.o., female Today's Date: 01/30/2022  PCP: Seward Carol, MD  REFERRING PROVIDER: Mcarthur Rossetti, MD    END OF SESSION:    PT End of Session - 01/30/22 1440     Visit Number 20    Number of Visits 27    Date for PT Re-Evaluation 02/20/22    Authorization Type Medicare/BCBS - KX after 15    Progress Note Due on Visit 25    PT Start Time 1430    PT Stop Time 1510    PT Time Calculation (min) 40 min    Activity Tolerance Patient tolerated treatment well    Behavior During Therapy WFL for tasks assessed/performed              Past Medical History:  Diagnosis Date   Arthritis    Hypothyroidism    Past Surgical History:  Procedure Laterality Date   BREAST EXCISIONAL BIOPSY Left 2014   COLONOSCOPY     MENISCUS REPAIR     TOTAL KNEE ARTHROPLASTY Left 11/01/2021   Procedure: LEFT TOTAL KNEE ARTHROPLASTY;  Surgeon: Mcarthur Rossetti, MD;  Location: [facility];  Service: Orthopedics;  Laterality: Left;   Patient Active Problem List   Diagnosis Date Noted   Status post total left knee replacement 11/01/2021   Unilateral primary osteoarthritis, left knee 09/10/2020    REFERRING DIAG: M17.12 (ICD-10-CM) - Unilateral primary osteoarthritis, left knee Z96.652 (ICD-10-CM) - Status post total left knee replacement   THERAPY DIAG:  Chronic pain of left knee  Unsteadiness on feet  Muscle weakness (generalized)  Other abnormalities of gait and mobility  Rationale for Evaluation and Treatment Rehabilitation  ONSET DATE: 11/01/21   SUBJECTIVE:   SUBJECTIVE STATEMENT: Pt indicated weakness in Rt leg but left leg is doing good.  PERTINENT HISTORY: hypothyroidism  PAIN:  NPRS scale: a very small amount.  Pain location: Lt knee Pain description: aching Aggravating factors: exercises/bending knee, movement, static positioning for too  long Relieving factors: meds, rest, ice  PRECAUTIONS: None  WEIGHT BEARING RESTRICTIONS No  FALLS:  Has patient fallen in last 6 months? No  LIVING ENVIRONMENT: Lives with: lives alone and daughter/son providing 24 hour care for the next few weeks Lives in: House/apartment Stairs: Yes: External: 2 steps; no railings, holds onto door jam; has 8 steps with handrail as well Has following equipment at home: Single point cane, Walker - 2 wheeled, and Electronics engineer  OCCUPATION: retired: Personnel officer  PLOF: Independent and Leisure: cooking and baking, reading, no regular exercise  PATIENT GOALS regain regular function, walk without a cane   OBJECTIVE:   PATIENT SURVEYS:  01/09/2022: FOTO update: 55  11/14/21: FOTO 38 (predicted 52)  COGNITION: 11/14/21 Overall cognitive status: Within functional limits for tasks assessed     SENSATION: 11/14/21 [facility]  EDEMA:  11/14/21 Circumferential: Joint Line: Rt: 37 cm /Lt: 39.5 cm   POSTURE:  11/14/21 rounded shoulders, forward head, and flexed trunk   LOWER EXTREMITY ROM:  Active ROM Right Eval 11/14/21 Left Eval 11/14/21 Left 11/20/21 Left 11/28/21 Left 12/03/21 Left 12/12/21 Left 12/31/21 Left 01/09/2022  Knee flexion 121 85 100 AAROM with strap supine 112 117 120  120 120 AROM in supine heel slide  Knee extension -2 (seated LAQ) -18 (seated LAQ)  -7 (seated LAQ) -7 (seated LAQ) -5 -5 seated LAQ -4 seated AROM in LAQ   (Blank rows = not tested)  Passive ROM Right Eval 11/14/21 Left Eval 11/14/21  Knee flexion  92  Knee extension  -1   (Blank rows = not tested)  LOWER EXTREMITY MMT:  MMT Right Eval 11/14/21 Left Eval 11/14/21 Left 11/26/2021 Left 12/12/21 Left 01/09/2022 Left 01/21/2022  Knee flexion  3-/5 5/5 5/5 5/5 5/5  Knee extension  3-/5 4/5 4+/5 4+/5 5/5   (Blank rows = not tested)   01/09/2022 : Lt SLS 8 seconds prior to loss of balance  GAIT: 01/21/2022:  Ambulation in clinic independent c  forward trunk lean.   01/09/2022:  Ambulated into clinic c SPC use.  Reported no cane use at home.   11/14/21: Distance walked: 100' in clinic Assistive device utilized: Single point cane Level of assistance: Modified independence Comments: decreased stance on Lt, decreased hip/knee flexion on Lt, flexed trunk    TODAY'S TREATMENT: 01/30/2022 Therex: Nustep Lvl 5 9 mins LE/UE Knee extension machine 10# DL X 15 then 5# double leg and SL lowering slowly 2 X 10 each leg  Seated SLR 2X10 bilat HEP review   TherActivity (to improve step navigation, transfers) 6 inch step up (power up and slow lower back down) with one UE support X 10 on Lt and 2X5 on Rt Sit to stand to sit slow lowering focus x 10 - no UE 18 inch chair  Leg press DL 68 lb x15 , then SL 31 lbs 2x15 Lt, 31# X 5 on Rt then 25# X 15 on Rt Squats with UE support X 10 bilat  01/29/2022 Therex: Nustep Lvl 6 8 mins LE Knee extension machine 10# DL X 10 then 5#l double leg and SL lowering slowly X 10 each leg Seated SLR 2X10 bilat HEP review   TherActivity (to improve step navigation, transfers) 6 inch step on over and down in frontt single hand rail assist x 10 bilat Sit to stand to sit slow lowering focus x 10 - no UE 18 inch chair  Leg press DL 68 lb x15 , then SL 31 lbs x15 Lt, 31# X 5 on Rt then 25# X 15 on Rt Squats with UE support X 10 bilat   PATIENT EDUCATION:  11/14/21 Education details: HEP Person educated: Patient and Child(ren) Education method: Explanation, Demonstration, and Handouts Education comprehension: verbalized understanding, returned demonstration, and needs further education   HOME EXERCISE PROGRAM: Access Code: [email or web address] URL: [email or web address] Date: 01/29/2022 Prepared by: Elsie Ra  Exercises - Seated Knee Flexion AAROM  - 3-5 x daily - 7 x weekly - 1 sets - 10 reps - 5-10 sec hold - Sit to Stand Without Arm Support  - 2 x daily - 6 x weekly - 1-2 sets - 10  reps - Forward Step Up with Counter Support  - 1 x daily - 7 x weekly - 1 sets - 10 reps - Seated Straight Leg Raise  - 1 x daily - 7 x weekly - 1 sets - 10 reps - Supine Quadriceps Stretch with Strap on Table  - 1 x daily - 7 x weekly - 1 sets - 2-3 reps - 30 sec hold ASSESSMENT:  CLINICAL IMPRESSION: We continued to work to maximize her leg strength for functional mobility and ADL's. Reviewed HEP again to work to transition her to independent program over her last remaining 2 visits.    OBJECTIVE IMPAIRMENTS Abnormal gait, decreased activity tolerance, decreased balance, decreased mobility, difficulty walking, decreased ROM, decreased strength, increased edema, increased fascial restrictions, impaired flexibility, postural dysfunction, and pain.  ACTIVITY LIMITATIONS carrying, lifting, bending, sitting, standing, squatting, sleeping, stairs, transfers, bed mobility, and locomotion level  PARTICIPATION LIMITATIONS: meal prep, cleaning, laundry, driving, shopping, community activity, and occupation  PERSONAL FACTORS 1 comorbidity: hypothyroidism  are also affecting patient's functional outcome.   REHAB POTENTIAL: Good  CLINICAL DECISION MAKING: Stable/uncomplicated  EVALUATION COMPLEXITY: Low   GOALS: Goals reviewed with patient? Yes  SHORT TERM GOALS: Target date: 12/12/2021  Independent with initial HEP Goal status:MET 12/17/21  2.  Lt knee AROM 0-10-100 for improved function. Goal status:MET 11/28/21  LONG TERM GOALS: Target date: 02/20/2022  Independent with final HEP Goal status: on going 01/21/2022  2.  FOTO score improved to 52 Goal status: Met 01/09/2022  3.  Lt knee AROM improved to 0-110 for improved mobility and function Goal status: on going 01/21/2022  4.  Report pain < 2/10 with standing/walking activities for improved function Goal status: on going 01/21/2022  5.  Amb without AD without significant deviations for improved function. Goal status:  on  going 01/21/2022  6. Patient will demonstrate ascending/descending 4 stairs reciprocally s UE assist for household entry.  Goal Status: on going 01/21/2022   PLAN: PT FREQUENCY: 2x/week  PT DURATION: 6 weeks  PLANNED INTERVENTIONS: Therapeutic exercises, Therapeutic activity, Neuromuscular re-education, Balance training, Gait training, Patient/Family education, Self Care, Joint mobilization, Stair training, DME instructions, Aquatic Therapy, Dry Needling, Electrical stimulation, Cryotherapy, Moist heat, scar mobilization, Taping, Vasopneumatic device, Ultrasound, Ionotophoresis 48m/ml Dexamethasone, Manual therapy, and Re-evaluation  PLAN FOR NEXT SESSION:  Continue education and progression towards transitioning to HEP over remaining visits. KX needed after 15 visits with medical necessity  BElsie Ra PT, DPT 01/30/22 2:40 PM

## 2022-02-04 ENCOUNTER — Ambulatory Visit: Payer: Medicare Other | Admitting: Rehabilitative and Restorative Service Providers"

## 2022-02-04 ENCOUNTER — Encounter: Payer: Self-pay | Admitting: Rehabilitative and Restorative Service Providers"

## 2022-02-04 DIAGNOSIS — G8929 Other chronic pain: Secondary | ICD-10-CM | POA: Diagnosis not present

## 2022-02-04 DIAGNOSIS — R600 Localized edema: Secondary | ICD-10-CM

## 2022-02-04 DIAGNOSIS — M6281 Muscle weakness (generalized): Secondary | ICD-10-CM | POA: Diagnosis not present

## 2022-02-04 DIAGNOSIS — R2681 Unsteadiness on feet: Secondary | ICD-10-CM

## 2022-02-04 DIAGNOSIS — R2689 Other abnormalities of gait and mobility: Secondary | ICD-10-CM

## 2022-02-04 DIAGNOSIS — M25662 Stiffness of left knee, not elsewhere classified: Secondary | ICD-10-CM | POA: Diagnosis not present

## 2022-02-04 DIAGNOSIS — M25562 Pain in left knee: Secondary | ICD-10-CM

## 2022-02-04 NOTE — Therapy (Signed)
OUTPATIENT PHYSICAL THERAPY TREATMENT    Patient Name: Toni Medina MRN: [id number] DOB:01/26/1932, 89 y.o., female Today's Date: 02/04/2022  PCP: Seward Carol, MD  REFERRING PROVIDER: Mcarthur Rossetti, MD    END OF SESSION:    PT End of Session - 02/04/22 1312     Visit Number 21    Number of Visits 27    Date for PT Re-Evaluation 02/20/22    Authorization Type Medicare/BCBS - KX after 15    Progress Note Due on Visit 25    PT Start Time 1305    PT Stop Time 1343    PT Time Calculation (min) 38 min    Activity Tolerance Patient tolerated treatment well    Behavior During Therapy WFL for tasks assessed/performed               Past Medical History:  Diagnosis Date   Arthritis    Hypothyroidism    Past Surgical History:  Procedure Laterality Date   BREAST EXCISIONAL BIOPSY Left 2014   COLONOSCOPY     MENISCUS REPAIR     TOTAL KNEE ARTHROPLASTY Left 11/01/2021   Procedure: LEFT TOTAL KNEE ARTHROPLASTY;  Surgeon: Mcarthur Rossetti, MD;  Location: [facility];  Service: Orthopedics;  Laterality: Left;   Patient Active Problem List   Diagnosis Date Noted   Status post total left knee replacement 11/01/2021   Unilateral primary osteoarthritis, left knee 09/10/2020    REFERRING DIAG: M17.12 (ICD-10-CM) - Unilateral primary osteoarthritis, left knee Z96.652 (ICD-10-CM) - Status post total left knee replacement   THERAPY DIAG:  Chronic pain of left knee  Unsteadiness on feet  Muscle weakness (generalized)  Other abnormalities of gait and mobility  Localized edema  Stiffness of left knee, not elsewhere classified  Rationale for Evaluation and Treatment Rehabilitation  ONSET DATE: 11/01/21   SUBJECTIVE:   SUBJECTIVE STATEMENT: Pt indicated no pain in Lt knee.  She reported having some pain in Rt knee at times and also reported back was hurting today.   PERTINENT HISTORY: hypothyroidism  PAIN:  NPRS scale: a very small amount.  Pain  location: Lt knee Pain description: aching Aggravating factors: exercises/bending knee, movement, static positioning for too long Relieving factors: meds, rest, ice  PRECAUTIONS: None  WEIGHT BEARING RESTRICTIONS No  FALLS:  Has patient fallen in last 6 months? No  LIVING ENVIRONMENT: Lives with: lives alone and daughter/son providing 24 hour care for the next few weeks Lives in: House/apartment Stairs: Yes: External: 2 steps; no railings, holds onto door jam; has 8 steps with handrail as well Has following equipment at home: Single point cane, Walker - 2 wheeled, and Electronics engineer  OCCUPATION: retired: Personnel officer  PLOF: Independent and Leisure: cooking and baking, reading, no regular exercise  PATIENT GOALS regain regular function, walk without a cane   OBJECTIVE:   PATIENT SURVEYS:  01/09/2022: FOTO update: 55  11/14/21: FOTO 38 (predicted 52)  COGNITION: 11/14/21 Overall cognitive status: Within functional limits for tasks assessed     SENSATION: 11/14/21 [facility]  EDEMA:  11/14/21 Circumferential: Joint Line: Rt: 37 cm /Lt: 39.5 cm   POSTURE:  11/14/21 rounded shoulders, forward head, and flexed trunk   LOWER EXTREMITY ROM:  Active ROM Right Eval 11/14/21 Left Eval 11/14/21 Left 11/20/21 Left 11/28/21 Left 12/03/21 Left 12/12/21 Left 12/31/21 Left 01/09/2022  Knee flexion 121 85 100 AAROM with strap supine 112 117 120  120 120 AROM in supine heel slide  Knee extension -2 (seated LAQ) -18 (  seated LAQ)  -7 (seated LAQ) -7 (seated LAQ) -5 -5 seated LAQ -4 seated AROM in LAQ   (Blank rows = not tested)  Passive ROM Right Eval 11/14/21 Left Eval 11/14/21  Knee flexion  92  Knee extension  -1   (Blank rows = not tested)  LOWER EXTREMITY MMT:  MMT Right Eval 11/14/21 Left Eval 11/14/21 Left 11/26/2021 Left 12/12/21 Left 01/09/2022 Left 01/21/2022  Knee flexion  3-/5 5/5 5/5 5/5 5/5  Knee extension  3-/5 4/5 4+/5 4+/5 5/5   (Blank rows = not  tested)   02/04/2022:  Lt SLS:  7 seconds  01/09/2022 : Lt SLS 8 seconds prior to loss of balance  GAIT: 02/04/2022:  Ambulation c SPC upon arrival (for back complaints).  Able to perform independent ambulation.   01/21/2022:  Ambulation in clinic independent c forward trunk lean.   01/09/2022:  Ambulated into clinic c SPC use.  Reported no cane use at home.   11/14/21: Distance walked: 100' in clinic Assistive device utilized: Single point cane Level of assistance: Modified independence Comments: decreased stance on Lt, decreased hip/knee flexion on Lt, flexed trunk    TODAY'S TREATMENT: 02/04/2022: Therex: Nustep Lvl 6 10 mins LE/UE Knee extension machine Double leg up, Lt leg lowering 3 x 10 5 lbs  Knee flexion machine Lt leg only 2 x 10 10 lbs  Leg press DL 68 lb x15 , then SL 31 lbs 2x15 Lt  Neuro Re- ed On foam standing heel/toe raises x 20 with one hand finger tip assist On foam alternate tapping 6 inch step x 10 bilateral  On foam feet together head turns Lt and Rt x 10 c SBA On foam feet together eyes closed c SBA to min A at times 30 sec x 5  01/30/2022 Therex: Nustep Lvl 5 9 mins LE/UE Knee extension machine 10# DL X 15 then 5# double leg and SL lowering slowly 2 X 10 each leg  Seated SLR 2X10 bilat HEP review   TherActivity (to improve step navigation, transfers) 6 inch step up (power up and slow lower back down) with one UE support X 10 on Lt and 2X5 on Rt Sit to stand to sit slow lowering focus x 10 - no UE 18 inch chair  Leg press DL 68 lb x15 , then SL 31 lbs 2x15 Lt, 31# X 5 on Rt then 25# X 15 on Rt Squats with UE support X 10 bilat   PATIENT EDUCATION:  11/14/21 Education details: HEP Person educated: Patient and Child(ren) Education method: Explanation, Demonstration, and Handouts Education comprehension: verbalized understanding, returned demonstration, and needs further education   HOME EXERCISE PROGRAM: Access Code: [email or web address] URL:  [email or web address] Date: 01/29/2022 Prepared by: Elsie Ra  Exercises - Seated Knee Flexion AAROM  - 3-5 x daily - 7 x weekly - 1 sets - 10 reps - 5-10 sec hold - Sit to Stand Without Arm Support  - 2 x daily - 6 x weekly - 1-2 sets - 10 reps - Forward Step Up with Counter Support  - 1 x daily - 7 x weekly - 1 sets - 10 reps - Seated Straight Leg Raise  - 1 x daily - 7 x weekly - 1 sets - 10 reps - Supine Quadriceps Stretch with Strap on Table  - 1 x daily - 7 x weekly - 1 sets - 2-3 reps - 30 sec hold  ASSESSMENT:  CLINICAL IMPRESSION: Fair to good control on compliant surface balance activity.  Pt required continued encouragement in support of HEP transitioning to help improve confidence in that transition.   Medical necessity for continued treatment to improve stability in ambulation.    OBJECTIVE IMPAIRMENTS Abnormal gait, decreased activity tolerance, decreased balance, decreased mobility, difficulty walking, decreased ROM, decreased strength, increased edema, increased fascial restrictions, impaired flexibility, postural dysfunction, and pain.   ACTIVITY LIMITATIONS carrying, lifting, bending, sitting, standing, squatting, sleeping, stairs, transfers, bed mobility, and locomotion level  PARTICIPATION LIMITATIONS: meal prep, cleaning, laundry, driving, shopping, community activity, and occupation  PERSONAL FACTORS 1 comorbidity: hypothyroidism  are also affecting patient's functional outcome.   REHAB POTENTIAL: Good  CLINICAL DECISION MAKING: Stable/uncomplicated  EVALUATION COMPLEXITY: Low   GOALS: Goals reviewed with patient? Yes  SHORT TERM GOALS: Target date: 12/12/2021  Independent with initial HEP Goal status:MET 12/17/21  2.  Lt knee AROM 0-10-100 for improved function. Goal status:MET 11/28/21  LONG TERM GOALS: Target date: 02/20/2022  Independent with final HEP Goal status: on going 01/21/2022  2.  FOTO score improved to 52 Goal status:  Met 01/09/2022  3.  Lt knee AROM improved to 0-110 for improved mobility and function Goal status: on going 01/21/2022  4.  Report pain < 2/10 with standing/walking activities for improved function Goal status: on going 01/21/2022  5.  Amb without AD without significant deviations for improved function. Goal status:  on going 01/21/2022  6. Patient will demonstrate ascending/descending 4 stairs reciprocally s UE assist for household entry.  Goal Status: on going 01/21/2022   PLAN: PT FREQUENCY: 2x/week  PT DURATION: 6 weeks  PLANNED INTERVENTIONS: Therapeutic exercises, Therapeutic activity, Neuromuscular re-education, Balance training, Gait training, Patient/Family education, Self Care, Joint mobilization, Stair training, DME instructions, Aquatic Therapy, Dry Needling, Electrical stimulation, Cryotherapy, Moist heat, scar mobilization, Taping, Vasopneumatic device, Ultrasound, Ionotophoresis 20m/ml Dexamethasone, Manual therapy, and Re-evaluation  PLAN FOR NEXT SESSION:  Continue education and progression towards transitioning to HEP    KX needed after 15 visits with medical necessity  MScot Jun PT, DPT, OCS, ATC 02/04/22  1:53 PM

## 2022-02-11 ENCOUNTER — Encounter: Payer: Medicare Other | Admitting: Physical Therapy

## 2022-02-13 ENCOUNTER — Encounter: Payer: Self-pay | Admitting: Physical Therapy

## 2022-02-13 ENCOUNTER — Ambulatory Visit (INDEPENDENT_AMBULATORY_CARE_PROVIDER_SITE_OTHER): Payer: Medicare Other | Admitting: Physical Therapy

## 2022-02-13 DIAGNOSIS — R2689 Other abnormalities of gait and mobility: Secondary | ICD-10-CM | POA: Diagnosis not present

## 2022-02-13 DIAGNOSIS — M6281 Muscle weakness (generalized): Secondary | ICD-10-CM | POA: Diagnosis not present

## 2022-02-13 DIAGNOSIS — R600 Localized edema: Secondary | ICD-10-CM | POA: Diagnosis not present

## 2022-02-13 DIAGNOSIS — R2681 Unsteadiness on feet: Secondary | ICD-10-CM | POA: Diagnosis not present

## 2022-02-13 DIAGNOSIS — G8929 Other chronic pain: Secondary | ICD-10-CM

## 2022-02-13 DIAGNOSIS — M25562 Pain in left knee: Secondary | ICD-10-CM | POA: Diagnosis not present

## 2022-02-13 DIAGNOSIS — M25662 Stiffness of left knee, not elsewhere classified: Secondary | ICD-10-CM

## 2022-02-13 NOTE — Therapy (Signed)
OUTPATIENT PHYSICAL THERAPY TREATMENT    Patient Name: Toni Medina MRN: [id number] DOB:1931-03-25, 89 y.o., female Today's Date: 02/13/2022  PCP: Seward Carol, MD  REFERRING PROVIDER: Mcarthur Rossetti, MD    END OF SESSION:    PT End of Session - 02/13/22 1523     Visit Number 22    Number of Visits 27    Date for PT Re-Evaluation 02/20/22    Authorization Type Medicare/BCBS - KX after 15    Progress Note Due on Visit 25    PT Start Time 1519    PT Stop Time 4270    PT Time Calculation (min) 35 min    Activity Tolerance Patient tolerated treatment well    Behavior During Therapy WFL for tasks assessed/performed                Past Medical History:  Diagnosis Date   Arthritis    Hypothyroidism    Past Surgical History:  Procedure Laterality Date   BREAST EXCISIONAL BIOPSY Left 2014   COLONOSCOPY     MENISCUS REPAIR     TOTAL KNEE ARTHROPLASTY Left 11/01/2021   Procedure: LEFT TOTAL KNEE ARTHROPLASTY;  Surgeon: Mcarthur Rossetti, MD;  Location: [facility];  Service: Orthopedics;  Laterality: Left;   Patient Active Problem List   Diagnosis Date Noted   Status post total left knee replacement 11/01/2021   Unilateral primary osteoarthritis, left knee 09/10/2020    REFERRING DIAG: M17.12 (ICD-10-CM) - Unilateral primary osteoarthritis, left knee Z96.652 (ICD-10-CM) - Status post total left knee replacement   THERAPY DIAG:  Chronic pain of left knee  Unsteadiness on feet  Muscle weakness (generalized)  Other abnormalities of gait and mobility  Localized edema  Stiffness of left knee, not elsewhere classified  Rationale for Evaluation and Treatment Rehabilitation  ONSET DATE: 11/01/21   SUBJECTIVE:   SUBJECTIVE STATEMENT: Lt knee is doing well  PERTINENT HISTORY: hypothyroidism  PAIN:  NPRS scale: a very small amount.  Pain location: Lt knee Pain description: aching Aggravating factors: exercises/bending knee, movement,  static positioning for too long Relieving factors: meds, rest, ice  PRECAUTIONS: None  WEIGHT BEARING RESTRICTIONS No  FALLS:  Has patient fallen in last 6 months? No  LIVING ENVIRONMENT: Lives with: lives alone and daughter/son providing 24 hour care for the next few weeks Lives in: House/apartment Stairs: Yes: External: 2 steps; no railings, holds onto door jam; has 8 steps with handrail as well Has following equipment at home: Single point cane, Walker - 2 wheeled, and Electronics engineer  OCCUPATION: retired: Personnel officer  PLOF: Independent and Leisure: cooking and baking, reading, no regular exercise  PATIENT GOALS regain regular function, walk without a cane   OBJECTIVE:   PATIENT SURVEYS:  01/09/2022: FOTO update: 55  11/14/21: FOTO 38 (predicted 52)  COGNITION: 11/14/21 Overall cognitive status: Within functional limits for tasks assessed     SENSATION: 11/14/21 [facility]  EDEMA:  11/14/21 Circumferential: Joint Line: Rt: 37 cm /Lt: 39.5 cm   POSTURE:  11/14/21 rounded shoulders, forward head, and flexed trunk   LOWER EXTREMITY ROM:  Active ROM Right Eval 11/14/21 Left Eval 11/14/21 Left 11/20/21 Left 11/28/21 Left 12/03/21 Left 12/12/21 Left 12/31/21 Left 01/09/2022  Knee flexion 121 85 100 AAROM with strap supine 112 117 120  120 120 AROM in supine heel slide  Knee extension -2 (seated LAQ) -18 (seated LAQ)  -7 (seated LAQ) -7 (seated LAQ) -5 -5 seated LAQ -4 seated AROM in LAQ   (  Blank rows = not tested)  Passive ROM Right Eval 11/14/21 Left Eval 11/14/21  Knee flexion  92  Knee extension  -1   (Blank rows = not tested)  LOWER EXTREMITY MMT:  MMT Right Eval 11/14/21 Left Eval 11/14/21 Left 11/26/2021 Left 12/12/21 Left 01/09/2022 Left 01/21/2022  Knee flexion  3-/5 5/5 5/5 5/5 5/5  Knee extension  3-/5 4/5 4+/5 4+/5 5/5   (Blank rows = not tested)   02/04/2022:  Lt SLS:  7 seconds  01/09/2022 : Lt SLS 8 seconds prior to loss of  balance  GAIT: 02/04/2022:  Ambulation c SPC upon arrival (for back complaints).  Able to perform independent ambulation.   01/21/2022:  Ambulation in clinic independent c forward trunk lean.   01/09/2022:  Ambulated into clinic c SPC use.  Reported no cane use at home.   11/14/21: Distance walked: 100' in clinic Assistive device utilized: Single point cane Level of assistance: Modified independence Comments: decreased stance on Lt, decreased hip/knee flexion on Lt, flexed trunk    TODAY'S TREATMENT: 02/13/22 TherEx: Bike L2 x 10 mins Calf raises 2x10 Mini Squats 2x10 Standing hip extension with L3 band x 20 reps bil; with bil UE support Standing hip abduction with L3 band x 20 reps bil; with bil UE support Reviewed and discussed HEP to continue at home, updated today Community fitness discussion including gyms and aquatics programs  02/04/2022: Therex: Nustep Lvl 6 10 mins LE/UE Knee extension machine Double leg up, Lt leg lowering 3 x 10 5 lbs  Knee flexion machine Lt leg only 2 x 10 10 lbs  Leg press DL 68 lb x15 , then SL 31 lbs 2x15 Lt  Neuro Re- ed On foam standing heel/toe raises x 20 with one hand finger tip assist On foam alternate tapping 6 inch step x 10 bilateral  On foam feet together head turns Lt and Rt x 10 c SBA On foam feet together eyes closed c SBA to min A at times 30 sec x 5  01/30/2022 Therex: Nustep Lvl 5 9 mins LE/UE Knee extension machine 10# DL X 15 then 5# double leg and SL lowering slowly 2 X 10 each leg  Seated SLR 2X10 bilat HEP review   TherActivity (to improve step navigation, transfers) 6 inch step up (power up and slow lower back down) with one UE support X 10 on Lt and 2X5 on Rt Sit to stand to sit slow lowering focus x 10 - no UE 18 inch chair  Leg press DL 68 lb x15 , then SL 31 lbs 2x15 Lt, 31# X 5 on Rt then 25# X 15 on Rt Squats with UE support X 10 bilat   PATIENT EDUCATION:  11/14/21 Education details: HEP Person  educated: Patient and Child(ren) Education method: Explanation, Demonstration, and Handouts Education comprehension: verbalized understanding, returned demonstration, and needs further education   HOME EXERCISE PROGRAM: Access Code: [email or web address] URL: [email or web address] Date: 02/13/2022 Prepared by: Faustino Congress  Exercises - Sit to Stand Without Arm Support  - 2 x daily - 6 x weekly - 1-2 sets - 10 reps - Forward Step Up with Counter Support  - 1 x daily - 7 x weekly - 1 sets - 10 reps - Seated Straight Leg Raise  - 1 x daily - 7 x weekly - 1 sets - 10 reps - Supine Quadriceps Stretch with Strap on Table  - 1 x daily - 7 x weekly - 1 sets - 2-3 reps - 30  sec hold - Heel Raises with Counter Support  - 1 x daily - 7 x weekly - 1 sets - 20 reps - Mini Squat with Counter Support  - 1 x daily - 7 x weekly - 3 sets - 10 reps - Standing Hip Extension with Resistance at Ankles and Counter Support  - 1 x daily - 7 x weekly - 3 sets - 10 reps - Standing Hip Abduction with Resistance at Ankles and Counter Support  - 1 x daily - 7 x weekly - 3 sets - 10 reps  ASSESSMENT:  CLINICAL IMPRESSION: Pt overall doing excellent with PT and is ready for transition to community fitness.  She is reluctant to d/c at this time, but overall Lt knee is in excellent shape.  Plan to transition to community fitness next week which we discussed gym and aquatics programs in the area.  Will benefit from PT to ensure smooth transition to community fitness.  OBJECTIVE IMPAIRMENTS Abnormal gait, decreased activity tolerance, decreased balance, decreased mobility, difficulty walking, decreased ROM, decreased strength, increased edema, increased fascial restrictions, impaired flexibility, postural dysfunction, and pain.   ACTIVITY LIMITATIONS carrying, lifting, bending, sitting, standing, squatting, sleeping, stairs, transfers, bed mobility, and locomotion level  PARTICIPATION LIMITATIONS: meal prep,  cleaning, laundry, driving, shopping, community activity, and occupation  PERSONAL FACTORS 1 comorbidity: hypothyroidism  are also affecting patient's functional outcome.   REHAB POTENTIAL: Good  CLINICAL DECISION MAKING: Stable/uncomplicated  EVALUATION COMPLEXITY: Low   GOALS: Goals reviewed with patient? Yes  SHORT TERM GOALS: Target date: 12/12/2021  Independent with initial HEP Goal status:MET 12/17/21  2.  Lt knee AROM 0-10-100 for improved function. Goal status:MET 11/28/21  LONG TERM GOALS: Target date: 02/20/2022  Independent with final HEP Goal status: on going 01/21/2022  2.  FOTO score improved to 52 Goal status: Met 01/09/2022  3.  Lt knee AROM improved to 0-110 for improved mobility and function Goal status: on going 01/21/2022  4.  Report pain < 2/10 with standing/walking activities for improved function Goal status: on going 01/21/2022  5.  Amb without AD without significant deviations for improved function. Goal status:  on going 01/21/2022  6. Patient will demonstrate ascending/descending 4 stairs reciprocally s UE assist for household entry.  Goal Status: on going 01/21/2022   PLAN: PT FREQUENCY: 2x/week  PT DURATION: 6 weeks  PLANNED INTERVENTIONS: Therapeutic exercises, Therapeutic activity, Neuromuscular re-education, Balance training, Gait training, Patient/Family education, Self Care, Joint mobilization, Stair training, DME instructions, Aquatic Therapy, Dry Needling, Electrical stimulation, Cryotherapy, Moist heat, scar mobilization, Taping, Vasopneumatic device, Ultrasound, Ionotophoresis '4mg'$ /ml Dexamethasone, Manual therapy, and Re-evaluation  PLAN FOR NEXT SESSION:  Giver her Sheri's number [phone]), review updated HEP, add some back strengthening exercises to HEP.   Plan to d/c 02/20/22.   KX needed after 15 visits with medical necessity  Laureen Abrahams, PT, DPT 02/13/22 3:58 PM

## 2022-02-14 ENCOUNTER — Telehealth: Payer: Self-pay | Admitting: *Deleted

## 2022-02-14 NOTE — Telephone Encounter (Signed)
Ortho bundle 90 day in person meeting completed.

## 2022-02-17 ENCOUNTER — Encounter: Payer: Self-pay | Admitting: Physical Therapy

## 2022-02-17 ENCOUNTER — Ambulatory Visit (INDEPENDENT_AMBULATORY_CARE_PROVIDER_SITE_OTHER): Payer: Medicare Other | Admitting: Physical Therapy

## 2022-02-17 DIAGNOSIS — M25562 Pain in left knee: Secondary | ICD-10-CM | POA: Diagnosis not present

## 2022-02-17 DIAGNOSIS — R2681 Unsteadiness on feet: Secondary | ICD-10-CM | POA: Diagnosis not present

## 2022-02-17 DIAGNOSIS — R2689 Other abnormalities of gait and mobility: Secondary | ICD-10-CM

## 2022-02-17 DIAGNOSIS — M6281 Muscle weakness (generalized): Secondary | ICD-10-CM

## 2022-02-17 DIAGNOSIS — Z23 Encounter for immunization: Secondary | ICD-10-CM | POA: Diagnosis not present

## 2022-02-17 DIAGNOSIS — G8929 Other chronic pain: Secondary | ICD-10-CM

## 2022-02-17 DIAGNOSIS — R600 Localized edema: Secondary | ICD-10-CM | POA: Diagnosis not present

## 2022-02-17 NOTE — Therapy (Signed)
OUTPATIENT PHYSICAL THERAPY TREATMENT    Patient Name: Toni Medina MRN: [id number] DOB:[birth year], 89 y.o., female Today's Date: 02/17/2022  PCP: Seward Carol, MD  REFERRING PROVIDER: Mcarthur Rossetti, MD    END OF SESSION:    PT End of Session - 02/17/22 1315     Visit Number 23    Number of Visits 27    Date for PT Re-Evaluation 02/20/22    Authorization Type Medicare/BCBS - KX after 15    Progress Note Due on Visit 25    PT Start Time 1304    PT Stop Time 1345    PT Time Calculation (min) 41 min    Activity Tolerance Patient tolerated treatment well    Behavior During Therapy WFL for tasks assessed/performed                Past Medical History:  Diagnosis Date   Arthritis    Hypothyroidism    Past Surgical History:  Procedure Laterality Date   BREAST EXCISIONAL BIOPSY Left 2014   COLONOSCOPY     MENISCUS REPAIR     TOTAL KNEE ARTHROPLASTY Left 11/01/2021   Procedure: LEFT TOTAL KNEE ARTHROPLASTY;  Surgeon: Mcarthur Rossetti, MD;  Location: [facility];  Service: Orthopedics;  Laterality: Left;   Patient Active Problem List   Diagnosis Date Noted   Status post total left knee replacement 11/01/2021   Unilateral primary osteoarthritis, left knee 09/10/2020    REFERRING DIAG: M17.12 (ICD-10-CM) - Unilateral primary osteoarthritis, left knee Z96.652 (ICD-10-CM) - Status post total left knee replacement   THERAPY DIAG:  Chronic pain of left knee  Unsteadiness on feet  Muscle weakness (generalized)  Other abnormalities of gait and mobility  Localized edema  Rationale for Evaluation and Treatment Rehabilitation  ONSET DATE: 11/01/21   SUBJECTIVE:   SUBJECTIVE STATEMENT: Lt knee is doing well and does not have complaints of pain in it today, her biggest complaint is the weakness in her Rt knee  PERTINENT HISTORY: hypothyroidism  PAIN:  NPRS scale: a very small amount.  Pain location: Lt knee Pain description:  aching Aggravating factors: exercises/bending knee, movement, static positioning for too long Relieving factors: meds, rest, ice  PRECAUTIONS: None  WEIGHT BEARING RESTRICTIONS No  FALLS:  Has patient fallen in last 6 months? No  LIVING ENVIRONMENT: Lives with: lives alone and daughter/son providing 24 hour care for the next few weeks Lives in: House/apartment Stairs: Yes: External: 2 steps; no railings, holds onto door jam; has 8 steps with handrail as well Has following equipment at home: Single point cane, Walker - 2 wheeled, and Electronics engineer  OCCUPATION: retired: Personnel officer  PLOF: Independent and Leisure: cooking and baking, reading, no regular exercise  PATIENT GOALS regain regular function, walk without a cane   OBJECTIVE:   PATIENT SURVEYS:  01/09/2022: FOTO update: 55  11/14/21: FOTO 38 (predicted 52)  COGNITION: 11/14/21 Overall cognitive status: Within functional limits for tasks assessed     SENSATION: 11/14/21 [facility]  EDEMA:  11/14/21 Circumferential: Joint Line: Rt: 37 cm /Lt: 39.5 cm   POSTURE:  11/14/21 rounded shoulders, forward head, and flexed trunk   LOWER EXTREMITY ROM:  Active ROM Right Eval 11/14/21 Left Eval 11/14/21 Left 11/20/21 Left 11/28/21 Left 12/03/21 Left 12/12/21 Left 12/31/21 Left 01/09/2022  Knee flexion 121 85 100 AAROM with strap supine 112 117 120  120 120 AROM in supine heel slide  Knee extension -2 (seated LAQ) -18 (seated LAQ)  -7 (seated LAQ) -7 (seated  LAQ) -5 -5 seated LAQ -4 seated AROM in LAQ   (Blank rows = not tested)  Passive ROM Right Eval 11/14/21 Left Eval 11/14/21  Knee flexion  92  Knee extension  -1   (Blank rows = not tested)  LOWER EXTREMITY MMT:  MMT Right Eval 11/14/21 Left Eval 11/14/21 Left 11/26/2021 Left 12/12/21 Left 01/09/2022 Left 01/21/2022  Knee flexion  3-/5 5/5 5/5 5/5 5/5  Knee extension  3-/5 4/5 4+/5 4+/5 5/5   (Blank rows = not tested)   02/04/2022:  Lt SLS:  7  seconds  01/09/2022 : Lt SLS 8 seconds prior to loss of balance  GAIT: 02/04/2022:  Ambulation c SPC upon arrival (for back complaints).  Able to perform independent ambulation.   01/21/2022:  Ambulation in clinic independent c forward trunk lean.   01/09/2022:  Ambulated into clinic c SPC use.  Reported no cane use at home.   11/14/21: Distance walked: 100' in clinic Assistive device utilized: Single point cane Level of assistance: Modified independence Comments: decreased stance on Lt, decreased hip/knee flexion on Lt, flexed trunk    TODAY'S TREATMENT: 02/17/22 TherEx: Nu step L 6, seat # 6 UE/LE for 10 mins Calf raises 2x10 Mini Squats x10 Standing hip extension with L3 band x 15 reps bil; with bil UE support Standing hip abduction with L3 band x 15 reps bil; with bil UE support Leg press DL 68 lb 2 x10 , then SL 31 lbs 2x15 Lt, 31# X 10, then X 5 on Rt  Step ups 6 inch with one UE support X 15 with left, X 10 with Rt Leg extension machine 5# 2X10 with left only, then up with both and down with Rt  X10, X 5 with 5# Seated hamstring curl machine 20# DL X 20 Sit to stand to sit slow lowering focus x 10 - no UE 18 inch chair   02/13/22 TherEx: Bike L2 x 10 mins Calf raises 2x10 Mini Squats 2x10 Standing hip extension with L3 band x 20 reps bil; with bil UE support Standing hip abduction with L3 band x 20 reps bil; with bil UE support Reviewed and discussed HEP to continue at home, updated today Community fitness discussion including gyms and aquatics programs    PATIENT EDUCATION:  11/14/21 Education details: HEP Person educated: Patient and Child(ren) Education method: Explanation, Demonstration, and Handouts Education comprehension: verbalized understanding, returned demonstration, and needs further education   HOME EXERCISE PROGRAM: Access Code: [email or web address] URL: [email or web address] Date: 02/13/2022 Prepared by: Faustino Congress  Exercises -  Sit to Stand Without Arm Support  - 2 x daily - 6 x weekly - 1-2 sets - 10 reps - Forward Step Up with Counter Support  - 1 x daily - 7 x weekly - 1 sets - 10 reps - Seated Straight Leg Raise  - 1 x daily - 7 x weekly - 1 sets - 10 reps - Supine Quadriceps Stretch with Strap on Table  - 1 x daily - 7 x weekly - 1 sets - 2-3 reps - 30 sec hold - Heel Raises with Counter Support  - 1 x daily - 7 x weekly - 1 sets - 20 reps - Mini Squat with Counter Support  - 1 x daily - 7 x weekly - 3 sets - 10 reps - Standing Hip Extension with Resistance at Ankles and Counter Support  - 1 x daily - 7 x weekly - 3 sets - 10 reps - Standing Hip Abduction with  Resistance at Ankles and Counter Support  - 1 x daily - 7 x weekly - 3 sets - 10 reps  ASSESSMENT:  CLINICAL IMPRESSION: Pt has done very well with her left knee post op TKA. She has one more visit and we will plan to discharge to independent program. We again reviewed her HEP and prioritized the most important strength exercises to continue to work on at home and printed this out for her.   OBJECTIVE IMPAIRMENTS Abnormal gait, decreased activity tolerance, decreased balance, decreased mobility, difficulty walking, decreased ROM, decreased strength, increased edema, increased fascial restrictions, impaired flexibility, postural dysfunction, and pain.   ACTIVITY LIMITATIONS carrying, lifting, bending, sitting, standing, squatting, sleeping, stairs, transfers, bed mobility, and locomotion level  PARTICIPATION LIMITATIONS: meal prep, cleaning, laundry, driving, shopping, community activity, and occupation  PERSONAL FACTORS 1 comorbidity: hypothyroidism  are also affecting patient's functional outcome.   REHAB POTENTIAL: Good  CLINICAL DECISION MAKING: Stable/uncomplicated  EVALUATION COMPLEXITY: Low   GOALS: Goals reviewed with patient? Yes  SHORT TERM GOALS: Target date: 12/12/2021  Independent with initial HEP Goal status:MET 12/17/21  2.  Lt  knee AROM 0-10-100 for improved function. Goal status:MET 11/28/21  LONG TERM GOALS: Target date: 02/20/2022  Independent with final HEP Goal status: on going 01/21/2022  2.  FOTO score improved to 52 Goal status: Met 01/09/2022  3.  Lt knee AROM improved to 0-110 for improved mobility and function Goal status: on going 01/21/2022  4.  Report pain < 2/10 with standing/walking activities for improved function Goal status: on going 01/21/2022  5.  Amb without AD without significant deviations for improved function. Goal status:  on going 01/21/2022  6. Patient will demonstrate ascending/descending 4 stairs reciprocally s UE assist for household entry.  Goal Status: on going 01/21/2022   PLAN: PT FREQUENCY: 2x/week  PT DURATION: 6 weeks  PLANNED INTERVENTIONS: Therapeutic exercises, Therapeutic activity, Neuromuscular re-education, Balance training, Gait training, Patient/Family education, Self Care, Joint mobilization, Stair training, DME instructions, Aquatic Therapy, Dry Needling, Electrical stimulation, Cryotherapy, Moist heat, scar mobilization, Taping, Vasopneumatic device, Ultrasound, Ionotophoresis '4mg'$ /ml Dexamethasone, Manual therapy, and Re-evaluation  PLAN FOR NEXT SESSION:  Giver her Sheri's number [phone]),  Plan to d/c 02/20/22.   KX needed after 15 visits with medical necessity  Elsie Ra, PT, DPT 02/17/22 1:42 PM

## 2022-02-18 NOTE — Telephone Encounter (Signed)
Error

## 2022-02-20 ENCOUNTER — Ambulatory Visit (INDEPENDENT_AMBULATORY_CARE_PROVIDER_SITE_OTHER): Payer: Medicare Other | Admitting: Physical Therapy

## 2022-02-20 ENCOUNTER — Encounter: Payer: Self-pay | Admitting: Physical Therapy

## 2022-02-20 DIAGNOSIS — M6281 Muscle weakness (generalized): Secondary | ICD-10-CM

## 2022-02-20 DIAGNOSIS — M25662 Stiffness of left knee, not elsewhere classified: Secondary | ICD-10-CM | POA: Diagnosis not present

## 2022-02-20 DIAGNOSIS — R2689 Other abnormalities of gait and mobility: Secondary | ICD-10-CM | POA: Diagnosis not present

## 2022-02-20 DIAGNOSIS — R600 Localized edema: Secondary | ICD-10-CM | POA: Diagnosis not present

## 2022-02-20 DIAGNOSIS — G8929 Other chronic pain: Secondary | ICD-10-CM | POA: Diagnosis not present

## 2022-02-20 DIAGNOSIS — R2681 Unsteadiness on feet: Secondary | ICD-10-CM

## 2022-02-20 DIAGNOSIS — M25562 Pain in left knee: Secondary | ICD-10-CM

## 2022-02-20 NOTE — Therapy (Signed)
OUTPATIENT PHYSICAL THERAPY TREATMENT  DISCHARGE SUMMARY   Patient Name: Toni Medina MRN: [id number] DOB:[birth year], 89 y.o., female Today's Date: 02/20/2022  PCP: Seward Carol, MD  REFERRING PROVIDER: Mcarthur Rossetti, MD    END OF SESSION:    PT End of Session - 02/20/22 1306     Visit Number 24    Date for PT Re-Evaluation 02/20/22    Authorization Type Medicare/BCBS - KX after 15    Progress Note Due on Visit 25    PT Start Time 1304    PT Stop Time 1334    PT Time Calculation (min) 30 min    Activity Tolerance Patient tolerated treatment well    Behavior During Therapy WFL for tasks assessed/performed                 Past Medical History:  Diagnosis Date   Arthritis    Hypothyroidism    Past Surgical History:  Procedure Laterality Date   BREAST EXCISIONAL BIOPSY Left 2014   COLONOSCOPY     MENISCUS REPAIR     TOTAL KNEE ARTHROPLASTY Left 11/01/2021   Procedure: LEFT TOTAL KNEE ARTHROPLASTY;  Surgeon: Mcarthur Rossetti, MD;  Location: [facility];  Service: Orthopedics;  Laterality: Left;   Patient Active Problem List   Diagnosis Date Noted   Status post total left knee replacement 11/01/2021   Unilateral primary osteoarthritis, left knee 09/10/2020    REFERRING DIAG: M17.12 (ICD-10-CM) - Unilateral primary osteoarthritis, left knee Z96.652 (ICD-10-CM) - Status post total left knee replacement   THERAPY DIAG:  Chronic pain of left knee  Unsteadiness on feet  Muscle weakness (generalized)  Other abnormalities of gait and mobility  Localized edema  Stiffness of left knee, not elsewhere classified  Rationale for Evaluation and Treatment Rehabilitation  ONSET DATE: 11/01/21   SUBJECTIVE:   SUBJECTIVE STATEMENT: Wants 2 copies of her exercises; Lt knee is doing well.  Back is uncomfortable today  PERTINENT HISTORY: hypothyroidism  PAIN:  NPRS scale: a very small amount.  Pain location: Lt knee Pain description:  aching Aggravating factors: exercises/bending knee, movement, static positioning for too long Relieving factors: meds, rest, ice  PRECAUTIONS: None  WEIGHT BEARING RESTRICTIONS No  FALLS:  Has patient fallen in last 6 months? No  LIVING ENVIRONMENT: Lives with: lives alone and daughter/son providing 24 hour care for the next few weeks Lives in: House/apartment Stairs: Yes: External: 2 steps; no railings, holds onto door jam; has 8 steps with handrail as well Has following equipment at home: Single point cane, Walker - 2 wheeled, and Electronics engineer  OCCUPATION: retired: Personnel officer  PLOF: Independent and Leisure: cooking and baking, reading, no regular exercise  PATIENT GOALS regain regular function, walk without a cane   OBJECTIVE:   PATIENT SURVEYS:  01/09/2022: FOTO update: 55  11/14/21: FOTO 38 (predicted 52)  COGNITION: 11/14/21 Overall cognitive status: Within functional limits for tasks assessed     SENSATION: 11/14/21 [facility]  EDEMA:  11/14/21 Circumferential: Joint Line: Rt: 37 cm /Lt: 39.5 cm   POSTURE:  11/14/21 rounded shoulders, forward head, and flexed trunk   LOWER EXTREMITY ROM:  Active ROM Right Eval 11/14/21 Left Eval 11/14/21 Left 11/20/21 Left 11/28/21 Left 12/03/21 Left 12/12/21 Left 12/31/21 Left 01/09/2022 Left 02/20/22  Knee flexion 121 85 100 AAROM with strap supine 112 117 120  120 120 AROM in supine heel slide   Knee extension -2 (seated LAQ) -18 (seated LAQ)  -7 (seated LAQ) -7 (seated LAQ) -5 -  5 seated LAQ -4 seated AROM in LAQ 0 seated AROM in LAQ   (Blank rows = not tested)  Passive ROM Right Eval 11/14/21 Left Eval 11/14/21  Knee flexion  92  Knee extension  -1   (Blank rows = not tested)  LOWER EXTREMITY MMT:  MMT Right Eval 11/14/21 Left Eval 11/14/21 Left 11/26/2021 Left 12/12/21 Left 01/09/2022 Left 01/21/2022  Knee flexion  3-/5 5/5 5/5 5/5 5/5  Knee extension  3-/5 4/5 4+/5 4+/5 5/5   (Blank rows = not  tested)   02/04/2022:  Lt SLS:  7 seconds  01/09/2022 : Lt SLS 8 seconds prior to loss of balance  GAIT: 02/04/2022:  Ambulation c SPC upon arrival (for back complaints).  Able to perform independent ambulation.   01/21/2022:  Ambulation in clinic independent c forward trunk lean.   01/09/2022:  Ambulated into clinic c SPC use.  Reported no cane use at home.   11/14/21: Distance walked: 100' in clinic Assistive device utilized: Single point cane Level of assistance: Modified independence Comments: decreased stance on Lt, decreased hip/knee flexion on Lt, flexed trunk    TODAY'S TREATMENT: 02/20/22 TherEx: Bike L3 x 10 mins Demonstrated updated HEP with pt performing 3-5 reps of each exercise with min cues.  Pt verbalized understanding Discussed community fitness (provided locations close to her) and recommended water exercise as well Negotiated 4 stairs; step to pattern with 1 hand rail (step to due to Rt knee pain)  02/17/22 TherEx: Nu step L 6, seat # 6 UE/LE for 10 mins Calf raises 2x10 Mini Squats x10 Standing hip extension with L3 band x 15 reps bil; with bil UE support Standing hip abduction with L3 band x 15 reps bil; with bil UE support Leg press DL 68 lb 2 x10 , then SL 31 lbs 2x15 Lt, 31# X 10, then X 5 on Rt  Step ups 6 inch with one UE support X 15 with left, X 10 with Rt Leg extension machine 5# 2X10 with left only, then up with both and down with Rt  X10, X 5 with 5# Seated hamstring curl machine 20# DL X 20 Sit to stand to sit slow lowering focus x 10 - no UE 18 inch chair   02/13/22 TherEx: Bike L2 x 10 mins Calf raises 2x10 Mini Squats 2x10 Standing hip extension with L3 band x 20 reps bil; with bil UE support Standing hip abduction with L3 band x 20 reps bil; with bil UE support Reviewed and discussed HEP to continue at home, updated today Community fitness discussion including gyms and aquatics programs    PATIENT EDUCATION:  11/14/21 Education  details: HEP Person educated: Patient and Child(ren) Education method: Explanation, Demonstration, and Handouts Education comprehension: verbalized understanding, returned demonstration, and needs further education   HOME EXERCISE PROGRAM: Access Code: [email or web address] URL: [email or web address] Date: 02/20/2022 Prepared by: Faustino Congress  Exercises - Sit to Stand Without Arm Support  - 2 x daily - 6 x weekly - 1-2 sets - 10 reps - Seated Straight Leg Raise  - 1 x daily - 7 x weekly - 1 sets - 10 reps - Mini Squat with Counter Support  - 1 x daily - 7 x weekly - 3 sets - 10 reps - Forward Step Up with Counter Support  - 1 x daily - 7 x weekly - 1 sets - 10 reps - Supine Quadriceps Stretch with Strap on Table  - 1 x daily - 7 x weekly - 1  sets - 2-3 reps - 30 sec hold - Heel Raises with Counter Support  - 1 x daily - 7 x weekly - 1 sets - 20 reps - Standing Hip Extension with Resistance at Ankles and Counter Support  - 1 x daily - 7 x weekly - 3 sets - 10 reps - Standing Hip Abduction with Resistance at Ankles and Counter Support  - 1 x daily - 7 x weekly - 3 sets - 10 reps - Hooklying Single Knee to Chest  - 2 x daily - 7 x weekly - 1 sets - 3 reps - 30 sec hold - Supine Piriformis Stretch with Foot on Ground  - 2 x daily - 7 x weekly - 1 sets - 3 reps - 30 sec hold - Supine Posterior Pelvic Tilt  - 2 x daily - 7 x weekly - 1 sets - 10 reps - 5 sec hold - Prone Hip Extension  - 1 x daily - 7 x weekly - 3 sets - 10 reps - Supine Bridge  - 2 x daily - 7 x weekly - 1 sets - 10 reps - 5 sec hold  ASSESSMENT:  CLINICAL IMPRESSION: Pt has met/partially met all goals at this time.  She is ready to d/c from PT for her Lt TKA.  Recommend continued exercise after d/c.   OBJECTIVE IMPAIRMENTS Abnormal gait, decreased activity tolerance, decreased balance, decreased mobility, difficulty walking, decreased ROM, decreased strength, increased edema, increased fascial restrictions,  impaired flexibility, postural dysfunction, and pain.   ACTIVITY LIMITATIONS carrying, lifting, bending, sitting, standing, squatting, sleeping, stairs, transfers, bed mobility, and locomotion level  PARTICIPATION LIMITATIONS: meal prep, cleaning, laundry, driving, shopping, community activity, and occupation  PERSONAL FACTORS 1 comorbidity: hypothyroidism  are also affecting patient's functional outcome.   REHAB POTENTIAL: Good  CLINICAL DECISION MAKING: Stable/uncomplicated  EVALUATION COMPLEXITY: Low   GOALS: Goals reviewed with patient? Yes  SHORT TERM GOALS: Target date: 12/12/2021  Independent with initial HEP Goal status:MET 12/17/21  2.  Lt knee AROM 0-10-100 for improved function. Goal status:MET 11/28/21  LONG TERM GOALS: Target date: 02/20/2022  Independent with final HEP Goal status: MET 02/20/22  2.  FOTO score improved to 52 Goal status: Met 01/09/2022  3.  Lt knee AROM improved to 0-110 for improved mobility and function Goal status: MET 02/20/22  4.  Report pain < 2/10 with standing/walking activities for improved function Goal status: MET 02/20/22  5.  Amb without AD without significant deviations for improved function. Goal status:  MET 02/20/22  6. Patient will demonstrate ascending/descending 4 stairs reciprocally s UE assist for household entry.  Goal Status: Partially MET 02/20/22 (needs rail due to Rt knee)   PLAN: PT FREQUENCY: 2x/week  PT DURATION: 6 weeks  PLANNED INTERVENTIONS: Therapeutic exercises, Therapeutic activity, Neuromuscular re-education, Balance training, Gait training, Patient/Family education, Self Care, Joint mobilization, Stair training, DME instructions, Aquatic Therapy, Dry Needling, Electrical stimulation, Cryotherapy, Moist heat, scar mobilization, Taping, Vasopneumatic device, Ultrasound, Ionotophoresis '4mg'$ /ml Dexamethasone, Manual therapy, and Re-evaluation  PLAN FOR NEXT SESSION:  D/C PT today     Laureen Abrahams, PT, DPT 02/20/22 1:38 PM    PHYSICAL THERAPY DISCHARGE SUMMARY  Visits from Start of Care: 23  Current functional level related to goals / functional outcomes: See above   Remaining deficits: See above   Education / Equipment: HEP   Patient agrees to discharge. Patient goals were partially met. Patient is being discharged due to meeting the stated rehab goals.  Laureen Abrahams, PT, DPT 02/20/22 1:38 PM  [facility] [address] [place], [name], [address] Phone: [phone]   Fax:  [phone]

## 2022-02-28 ENCOUNTER — Telehealth: Payer: Self-pay | Admitting: Orthopaedic Surgery

## 2022-02-28 NOTE — Telephone Encounter (Signed)
Patient states she was scammed and she wanted to let Dr. Ninfa Linden know that they were asking about her medications and who her Dr. Was patient was addiment about letting Caryl Pina and Dr. Ninfa Linden know.

## 2022-03-13 ENCOUNTER — Ambulatory Visit (INDEPENDENT_AMBULATORY_CARE_PROVIDER_SITE_OTHER): Payer: Medicare Other | Admitting: Orthopaedic Surgery

## 2022-03-13 ENCOUNTER — Encounter: Payer: Self-pay | Admitting: Orthopaedic Surgery

## 2022-03-13 ENCOUNTER — Other Ambulatory Visit: Payer: Self-pay

## 2022-03-13 DIAGNOSIS — M545 Low back pain, unspecified: Secondary | ICD-10-CM | POA: Diagnosis not present

## 2022-03-13 DIAGNOSIS — G8929 Other chronic pain: Secondary | ICD-10-CM

## 2022-03-13 DIAGNOSIS — Z96652 Presence of left artificial knee joint: Secondary | ICD-10-CM

## 2022-03-13 NOTE — Progress Notes (Signed)
The patient is now 5 months status post a left total knee arthroplasty.  She said that knee is doing very well.  She is 89 years old.  She does have valgus malalignment of her right knee but that knee is now not symptomatic for her.  She does report low back pain that has been a chronic issue.  I did see x-rays from her lumbar spine several years ago showing loss of lumbar lordosis and degenerative changes at multiple levels especially between L4-L5 and L5-S1.  Her pain seems to be more facet joint related in the lower lumbar spine.  She is a thin individual.  She denies any radicular symptoms.  Her left operative knee has excellent and full range of motion is nice and straight.  It is likely to be stable.  I would like to actually send her to outpatient physical therapy only for her lumbar spine for any modalities that can help with her posture and core strengthening as well as anything that can help decrease her back pain as well as showing her some things to do at home.  Will see her back in 6 weeks after course of lumbar spine PT.  If she is still having problems I would like new x-rays of her lumbar spine with AP and lateral lumbar spine and 2 views of her left operative knee.

## 2022-03-26 ENCOUNTER — Other Ambulatory Visit: Payer: Self-pay | Admitting: Internal Medicine

## 2022-03-26 DIAGNOSIS — Z1231 Encounter for screening mammogram for malignant neoplasm of breast: Secondary | ICD-10-CM

## 2022-03-27 ENCOUNTER — Other Ambulatory Visit: Payer: Self-pay

## 2022-03-27 ENCOUNTER — Encounter: Payer: Self-pay | Admitting: Rehabilitative and Restorative Service Providers"

## 2022-03-27 ENCOUNTER — Ambulatory Visit (INDEPENDENT_AMBULATORY_CARE_PROVIDER_SITE_OTHER): Payer: Medicare Other | Admitting: Rehabilitative and Restorative Service Providers"

## 2022-03-27 DIAGNOSIS — R293 Abnormal posture: Secondary | ICD-10-CM

## 2022-03-27 DIAGNOSIS — M6281 Muscle weakness (generalized): Secondary | ICD-10-CM

## 2022-03-27 DIAGNOSIS — R262 Difficulty in walking, not elsewhere classified: Secondary | ICD-10-CM | POA: Diagnosis not present

## 2022-03-27 DIAGNOSIS — M5459 Other low back pain: Secondary | ICD-10-CM

## 2022-03-27 NOTE — Therapy (Signed)
OUTPATIENT PHYSICAL THERAPY THORACOLUMBAR EVALUATION   Patient Name: Toni Medina MRN: [id number] DOB:24-Jul-1931, 89 y.o., female Today's Date: 03/27/2022  END OF SESSION:  PT End of Session - 03/27/22 1506     Visit Number 1    Number of Visits 16    Date for PT Re-Evaluation 05/22/22    Authorization Type Medicare    Progress Note Due on Visit 10    PT Start Time 1100    PT Stop Time 1145    PT Time Calculation (min) 45 min    Activity Tolerance Patient tolerated treatment well;No increased pain    Behavior During Therapy WFL for tasks assessed/performed             Past Medical History:  Diagnosis Date   Arthritis    Hypothyroidism    Past Surgical History:  Procedure Laterality Date   BREAST EXCISIONAL BIOPSY Left 2014   COLONOSCOPY     MENISCUS REPAIR     TOTAL KNEE ARTHROPLASTY Left 11/01/2021   Procedure: LEFT TOTAL KNEE ARTHROPLASTY;  Surgeon: Mcarthur Rossetti, MD;  Location: [facility];  Service: Orthopedics;  Laterality: Left;   Patient Active Problem List   Diagnosis Date Noted   Status post total left knee replacement 11/01/2021    PCP: Seward Carol, MD  REFERRING PROVIDER: Mcarthur Rossetti, MD  REFERRING DIAG: [phone] (ICD-10-CM) - Chronic bilateral low back pain without sciatica  Rationale for Evaluation and Treatment: Rehabilitation  THERAPY DIAG:  Abnormal posture  Difficulty in walking, not elsewhere classified  Muscle weakness (generalized)  Other low back pain  ONSET DATE: Chronic  SUBJECTIVE:                                                                                                                                                                                           SUBJECTIVE STATEMENT: Toni Medina notes long-term low back pain which has been worsening over the past several weeks.  She notes she gets relief with sitting or lying down and has difficulty with standing or walking.  She also notes she gets  relief if she is able to hold onto a shopping cart while grocery shopping.  She denies any complaints of peripheral pain or paresthesias.  PERTINENT HISTORY:  OA, hypothyroid, Lt TKA 10/2021  PAIN:  Are you having pain? Yes: NPRS scale: 2-6/10 Pain location: Low back Pain description: Ache Aggravating factors: Prolonged standing, walking Relieving factors: With sitting and lying down  PRECAUTIONS: Back  WEIGHT BEARING RESTRICTIONS: No  FALLS:  Has patient fallen in last 6 months? No  LIVING ENVIRONMENT: Lives with: By herself, uses a cane Lives  in: House/apartment Stairs:  OK with rail Has following equipment at home: Single point cane and Grab bars  OCCUPATION: Retired  PLOF: Independent  PATIENT GOALS: Be able to stand and walk for longer periods of time, improve posture, know what to   NEXT MD VISIT: In 4 weeks  OBJECTIVE:   DIAGNOSTIC FINDINGS:   2017 study: Multilevel degenerative disc disease of the lumbar spine without compression fracture. Given the patient's progressive symptoms, lumbar spine MRI would be a useful next imaging step. Next item abdominal aortic atherosclerosis.  PATIENT SURVEYS:  FOTO 40 (Goal 54 in 12 visits)  SCREENING FOR RED FLAGS: Bowel or bladder incontinence: No Spinal tumors: No Cauda equina syndrome: No Compression fracture: No  COGNITION: Overall cognitive status: Within functional limits for tasks assessed     SENSATION: No tingling or numbness noted  MUSCLE LENGTH: Hamstrings: Right 50 deg; Left 45 deg  POSTURE: rounded shoulders, forward head, and decreased lumbar lordosis  LUMBAR ROM:   AROM 03/27/2022  Flexion   Extension 5  Right lateral flexion   Left lateral flexion   Right rotation   Left rotation    (Blank rows = not tested)  LOWER EXTREMITY ROM:     Passive  Left/Right 03/27/2022   Hip flexion 110/110   Hip extension    Hip abduction    Hip adduction    Hip internal rotation 17/17   Hip external  rotation 13/23   Knee flexion    Knee extension    Ankle dorsiflexion    Ankle plantarflexion    Ankle inversion    Ankle eversion     (Blank rows = not tested)  LOWER EXTREMITY STRENGTH:    MMT 03/27/2022   Hip flexion    Lumbar extension Deferred secondary to Toni Medina's inability to assume testing postures   Hip abduction    Hip adduction    Hip internal rotation    Hip external rotation    Knee flexion    Knee extension    Ankle dorsiflexion    Ankle plantarflexion    Ankle inversion    Ankle eversion     (Blank rows = not tested)  GAIT: Distance walked: Within the clinic Assistive device utilized: Single point cane Level of assistance: Modified independence Comments: Toni Medina stands and walks in a very flexed posture  TODAY'S TREATMENT:                                                                                                                              DATE: 03/27/2022  Figure 4 stretch 4X 20 seconds Bil Shoulder blade pinches 10 x 5 seconds Lumbar extension AROM 10 x 3 seconds  Functional activities: Discussed spine anatomy using a model, reviewed exam findings, discussed log and lumbar roll  PATIENT EDUCATION:  Education details: See above Person educated: Patient and Child(ren) Education method: Explanation, Demonstration, Tactile cues, Verbal cues, and Handouts Education comprehension: verbalized understanding, returned demonstration, verbal cues  required, tactile cues required, and needs further education  HOME EXERCISE PROGRAM: Access Code: [email or web address] URL: [email or web address] Date: 03/27/2022 Prepared by: Vista Mink  Exercises - Supine Figure 4 Piriformis Stretch  - 2-3 x daily - 7 x weekly - 1 sets - 5 reps - 20 seconds hold - Standing Lumbar Extension at [place]  - 5 x daily - 7 x weekly - 1 sets - 5 reps - 3 seconds hold - Standing Scapular Retraction  - 5 x daily - 7 x weekly - 1 sets - 5 reps - 5 second  hold  ASSESSMENT:  CLINICAL IMPRESSION: Patient is a 89 y.o. female who was seen today for physical therapy evaluation and treatment for low back pain.  Toni Medina stands and walks in a very flexed posture and because of weak postural musculature, she fatigues very quickly.  This results in increased low back pain.  She was started today on a postural correction and strengthening program to improve her standing and walking endurance.  Labrina's prognosis to meet the below listed goals is good with the recommended plan of care.  OBJECTIVE IMPAIRMENTS: Abnormal gait, decreased activity tolerance, decreased endurance, decreased knowledge of condition, difficulty walking, decreased ROM, decreased strength, decreased safety awareness, impaired perceived functional ability, impaired flexibility, improper body mechanics, postural dysfunction, and pain.   ACTIVITY LIMITATIONS: carrying, lifting, bending, sitting, standing, squatting, stairs, bed mobility, and locomotion level  PARTICIPATION LIMITATIONS: meal prep, cleaning, laundry, shopping, and community activity  PERSONAL FACTORS: OA, hypothyroid, Lt TKA 10/2021 are also affecting patient's functional outcome.   REHAB POTENTIAL: Good  CLINICAL DECISION MAKING: Stable/uncomplicated  EVALUATION COMPLEXITY: Low   GOALS: Goals reviewed with patient? Yes  SHORT TERM GOALS: Target date: 04/24/2022  Aalya will be able to demonstrate correct sitting and standing posture along with correct use of a lumbar roll and correct body mechanics for getting in and out of bed Baseline: All areas above need to be addressed Goal status: INITIAL  2.  Mckaila will be independent with her day 1 home exercise program Baseline: Started 03/27/2022 Goal status: INITIAL   LONG TERM GOALS: Target date: 05/22/2022  Improve FOTO to 54 in 12 visits her left Baseline: 40 Goal status: INITIAL  2.  Shalonna will report low back pain consistently 0-3 out of 10 on the  visual analog scale and will demonstrate the ability to manage symptoms when they increase with postural and exercise correction Baseline: As high as 6 out of 10 Goal status: INITIAL  3.  Improve lumbar extension AROM to 10 degrees Baseline: 5 degrees Goal status: INITIAL  4.  Improve low back and postural strength as assessed by FOTO and self-reported function Baseline: See FOTO Goal status: INITIAL  5.  Jalynn will be able to stand and walk for longer periods of time without being stopped by low back pain Baseline: Limited at evaluation 03/27/2022 Goal status: INITIAL  6.  Jordin will be independent with her long-term home exercise program at discharge Baseline: Started 03/27/2022 Goal status: INITIAL  PLAN:  PT FREQUENCY: 1-2x/week  PT DURATION: 8 weeks  PLANNED INTERVENTIONS: Therapeutic exercises, Therapeutic activity, Neuromuscular re-education, Balance training, Gait training, Patient/Family education, Self Care, Joint mobilization, Stair training, Cryotherapy, Moist heat, and Manual therapy.  PLAN FOR NEXT SESSION: Review home exercise program from day 1, spend a lot of time on posture and body mechanics correction.  Consider pool to chest and shoulder external rotation Thera-Band exercises for postural correction.  Consider hip hike and sit to  stand for strengthening and postural correction.   Farley Ly, PT, MPT 03/27/2022, 3:09 PM

## 2022-04-08 ENCOUNTER — Encounter: Payer: Medicare Other | Admitting: Rehabilitative and Restorative Service Providers"

## 2022-04-08 ENCOUNTER — Encounter: Payer: Self-pay | Admitting: Rehabilitative and Restorative Service Providers"

## 2022-04-08 ENCOUNTER — Ambulatory Visit (INDEPENDENT_AMBULATORY_CARE_PROVIDER_SITE_OTHER): Payer: Medicare Other | Admitting: Rehabilitative and Restorative Service Providers"

## 2022-04-08 DIAGNOSIS — M5459 Other low back pain: Secondary | ICD-10-CM | POA: Diagnosis not present

## 2022-04-08 DIAGNOSIS — M6281 Muscle weakness (generalized): Secondary | ICD-10-CM

## 2022-04-08 DIAGNOSIS — R293 Abnormal posture: Secondary | ICD-10-CM | POA: Diagnosis not present

## 2022-04-08 DIAGNOSIS — R262 Difficulty in walking, not elsewhere classified: Secondary | ICD-10-CM | POA: Diagnosis not present

## 2022-04-08 NOTE — Therapy (Addendum)
OUTPATIENT PHYSICAL THERAPY TREATMENT   Patient Name: Toni Medina MRN: [id number] DOB:1931-05-28, 89 y.o., female Today's Date: 04/08/2022  END OF SESSION:  PT End of Session - 04/08/22 1459     Visit Number 2    Number of Visits 16    Date for PT Re-Evaluation 05/22/22    Authorization Type Medicare    Progress Note Due on Visit 10    PT Start Time [id number]    PT Stop Time 1505    PT Time Calculation (min) 40 min    Activity Tolerance Patient tolerated treatment well    Behavior During Therapy WFL for tasks assessed/performed              Past Medical History:  Diagnosis Date   Arthritis    Hypothyroidism    Past Surgical History:  Procedure Laterality Date   BREAST EXCISIONAL BIOPSY Left 2014   COLONOSCOPY     MENISCUS REPAIR     TOTAL KNEE ARTHROPLASTY Left 11/01/2021   Procedure: LEFT TOTAL KNEE ARTHROPLASTY;  Surgeon: Mcarthur Rossetti, MD;  Location: [facility];  Service: Orthopedics;  Laterality: Left;   Patient Active Problem List   Diagnosis Date Noted   Status post total left knee replacement 11/01/2021    PCP: Seward Carol, MD  REFERRING PROVIDER: Mcarthur Rossetti, MD  REFERRING DIAG: [phone] (ICD-10-CM) - Chronic bilateral low back pain without sciatica  Rationale for Evaluation and Treatment: Rehabilitation  THERAPY DIAG:  Abnormal posture  Difficulty in walking, not elsewhere classified  Muscle weakness (generalized)  Other low back pain  ONSET DATE: Chronic  SUBJECTIVE:                                                                                                                                                                                           SUBJECTIVE STATEMENT: She indicated pain up to 4-5/10 in last day or so.  Reported good improvement with rest.  Pain typically worse with walking.   PERTINENT HISTORY:  OA, hypothyroid, Lt TKA 10/2021  PAIN:  NPRS scale: 4-5/10 Pain location: Low back Pain  description: Ache Aggravating factors: Prolonged standing, walking Relieving factors: With sitting and lying down  PRECAUTIONS: Back  WEIGHT BEARING RESTRICTIONS: No  FALLS:  Has patient fallen in last 6 months? No  LIVING ENVIRONMENT: Lives with: By herself, uses a cane Lives in: House/apartment Stairs:  OK with rail Has following equipment at home: Single point cane and Grab bars  OCCUPATION: Retired  PLOF: Independent  PATIENT GOALS: Be able to stand and walk for longer periods of time, improve posture, know what to  OBJECTIVE:   DIAGNOSTIC FINDINGS:  03/27/2022 review:   2017 study: Multilevel degenerative disc disease of the lumbar spine without compression fracture. Given the patient's progressive symptoms, lumbar spine MRI would be a useful next imaging step. Next item abdominal aortic atherosclerosis.  PATIENT SURVEYS:  03/27/2022 FOTO 40 (Goal 54 in 12 visits)  SCREENING FOR RED FLAGS: 03/27/2022 Bowel or bladder incontinence: No Spinal tumors: No Cauda equina syndrome: No Compression fracture: No  COGNITION: 03/27/2022 Overall cognitive status: Within functional limits for tasks assessed     SENSATION: 03/27/2022 No tingling or numbness noted  MUSCLE LENGTH: 03/27/2022 Hamstrings: Right 50 deg; Left 45 deg  POSTURE:  2/22/2024rounded shoulders, forward head, and decreased lumbar lordosis  LUMBAR ROM:   AROM 03/27/2022  Flexion   Extension 5  Right lateral flexion   Left lateral flexion   Right rotation   Left rotation    (Blank rows = not tested)  LOWER EXTREMITY ROM:     Passive  Left/Right 03/27/2022   Hip flexion 110/110   Hip extension    Hip abduction    Hip adduction    Hip internal rotation 17/17   Hip external rotation 13/23   Knee flexion    Knee extension    Ankle dorsiflexion    Ankle plantarflexion    Ankle inversion    Ankle eversion     (Blank rows = not tested)  LOWER EXTREMITY STRENGTH:    MMT 03/27/2022    Hip flexion    Lumbar extension Deferred secondary to Cecille's inability to assume testing postures   Hip abduction    Hip adduction    Hip internal rotation    Hip external rotation    Knee flexion    Knee extension    Ankle dorsiflexion    Ankle plantarflexion    Ankle inversion    Ankle eversion     (Blank rows = not tested)  GAIT: 03/27/2022 Distance walked: Within the clinic Assistive device utilized: Single point cane Level of assistance: Modified independence Comments: Romonda stands and walks in a very flexed posture  TODAY'S TREATMENT:                                                                DATE: 04/08/2022 Therex: Nustep Lvl 6 12 mins UE/LE  Supine figure 4 push away stretch 15 sec x 3 bilateral Supine bridge 2-3 sec hold x 15 (added to home) Standing lumbar extension AROM x10 Tband rows green 2 x 10 c scapular retraction Tband gh ext green 2 x 10   Additional time spent in review of HEP and cues for improved knowledge and performance.      TODAY'S TREATMENT:                                                                DATE: 03/27/2022  Figure 4 stretch 4X 20 seconds Bil Shoulder blade pinches 10 x 5 seconds Lumbar extension AROM 10 x 3 seconds  Functional activities: Discussed spine anatomy using a model, reviewed exam findings, discussed log  and lumbar roll  PATIENT EDUCATION:  03/27/2022 Education details: See above Person educated: Patient and Child(ren) Education method: Explanation, Demonstration, Tactile cues, Verbal cues, and Handouts Education comprehension: verbalized understanding, returned demonstration, verbal cues required, tactile cues required, and needs further education  HOME EXERCISE PROGRAM: Access Code: [email or web address] URL: [email or web address] Date: 04/08/2022 Prepared by: Scot Jun  Exercises - Supine Figure 4 Piriformis Stretch  - 1-2 x daily - 7 x weekly - 1 sets - 5 reps - 20 seconds hold - Standing Lumbar  Extension at [place]  - 5 x daily - 7 x weekly - 1 sets - 5 reps - 3 seconds hold - Standing Scapular Retraction  - 5 x daily - 7 x weekly - 1 sets - 5 reps - 5 second hold - Supine Bridge  - 1-2 x daily - 7 x weekly - 1-2 sets - 10 reps - 2 hold  ASSESSMENT:  CLINICAL IMPRESSION: Adjusted HEP to avoid pain increases from knee and other body parts (reflected in HEP adjustment).  Discussed with patient and daughter about opportunity to utilize aquatic therapy program once a week on Friday and then have land based therapy earlier in the week.  Interest was there and will be discussed in future.  Continued postural limitations noted in ability to perform standing/walking upright activity for longer periods of time.   OBJECTIVE IMPAIRMENTS: Abnormal gait, decreased activity tolerance, decreased endurance, decreased knowledge of condition, difficulty walking, decreased ROM, decreased strength, decreased safety awareness, impaired perceived functional ability, impaired flexibility, improper body mechanics, postural dysfunction, and pain.   ACTIVITY LIMITATIONS: carrying, lifting, bending, sitting, standing, squatting, stairs, bed mobility, and locomotion level  PARTICIPATION LIMITATIONS: meal prep, cleaning, laundry, shopping, and community activity  PERSONAL FACTORS: OA, hypothyroid, Lt TKA 10/2021 are also affecting patient's functional outcome.   REHAB POTENTIAL: Good  CLINICAL DECISION MAKING: Stable/uncomplicated  EVALUATION COMPLEXITY: Low   GOALS: Goals reviewed with patient? Yes  SHORT TERM GOALS: Target date: 04/24/2022  Lorana will be able to demonstrate correct sitting and standing posture along with correct use of a lumbar roll and correct body mechanics for getting in and out of bed Baseline: All areas above need to be addressed Goal status:  on going 04/08/2022  2.  Sinaya will be independent with her day 1 home exercise program Baseline: Started 03/27/2022 Goal  status: on going 04/08/2022   LONG TERM GOALS: Target date: 05/22/2022  Improve FOTO to 54 in 12 visits her left Baseline: 40 Goal status: INITIAL  2.  Shyera will report low back pain consistently 0-3 out of 10 on the visual analog scale and will demonstrate the ability to manage symptoms when they increase with postural and exercise correction Baseline: As high as 6 out of 10 Goal status: INITIAL  3.  Improve lumbar extension AROM to 10 degrees Baseline: 5 degrees Goal status: INITIAL  4.  Improve low back and postural strength as assessed by FOTO and self-reported function Baseline: See FOTO Goal status: INITIAL  5.  Nakieta will be able to stand and walk for longer periods of time without being stopped by low back pain Baseline: Limited at evaluation 03/27/2022 Goal status: INITIAL  6.  Blayze will be independent with her long-term home exercise program at discharge Baseline: Started 03/27/2022 Goal status: INITIAL  PLAN:  PT FREQUENCY: 1-2x/week  PT DURATION: 8 weeks  PLANNED INTERVENTIONS: Therapeutic exercises, Therapeutic activity, Neuromuscular re-education, Balance training, Gait training, Patient/Family education, Self Care, Joint mobilization, Stair training, Cryotherapy,  Moist heat, and Manual therapy.  PLAN FOR NEXT SESSION:   Progressive postural strengthening, general exercise.  Check on their thoughts on aquatic therapy option and whether that would be desired for future use ( will need new POC due to aquatic therapy not being listed in planned intervention)  Would recommend 1x week in land and 1x week in pool if desired (Friday mornings)    Scot Jun, PT, DPT, OCS, ATC 04/08/22  3:19 PM

## 2022-04-10 ENCOUNTER — Ambulatory Visit (INDEPENDENT_AMBULATORY_CARE_PROVIDER_SITE_OTHER): Payer: Medicare Other | Admitting: Rehabilitative and Restorative Service Providers"

## 2022-04-10 ENCOUNTER — Encounter: Payer: Medicare Other | Admitting: Rehabilitative and Restorative Service Providers"

## 2022-04-10 ENCOUNTER — Encounter: Payer: Self-pay | Admitting: Radiology

## 2022-04-10 ENCOUNTER — Encounter: Payer: Self-pay | Admitting: Rehabilitative and Restorative Service Providers"

## 2022-04-10 DIAGNOSIS — R262 Difficulty in walking, not elsewhere classified: Secondary | ICD-10-CM | POA: Diagnosis not present

## 2022-04-10 DIAGNOSIS — R293 Abnormal posture: Secondary | ICD-10-CM | POA: Diagnosis not present

## 2022-04-10 DIAGNOSIS — M6281 Muscle weakness (generalized): Secondary | ICD-10-CM

## 2022-04-10 DIAGNOSIS — M5459 Other low back pain: Secondary | ICD-10-CM

## 2022-04-10 NOTE — Therapy (Signed)
OUTPATIENT PHYSICAL THERAPY TREATMENT   Patient Name: Toni Medina MRN: [id number] DOB:1931/06/02, 89 y.o., female Today's Date: 04/10/2022  END OF SESSION:  PT End of Session - 04/10/22 1449     Visit Number 3    Number of Visits 16    Date for PT Re-Evaluation 05/22/22    Authorization Type Medicare    Authorization - Visit Number 3    Progress Note Due on Visit 10    PT Start Time [id number]    PT Stop Time 1525    PT Time Calculation (min) 40 min    Activity Tolerance Patient tolerated treatment well;No increased pain    Behavior During Therapy WFL for tasks assessed/performed               Past Medical History:  Diagnosis Date   Arthritis    Hypothyroidism    Past Surgical History:  Procedure Laterality Date   BREAST EXCISIONAL BIOPSY Left 2014   COLONOSCOPY     MENISCUS REPAIR     TOTAL KNEE ARTHROPLASTY Left 11/01/2021   Procedure: LEFT TOTAL KNEE ARTHROPLASTY;  Surgeon: Mcarthur Rossetti, MD;  Location: [facility];  Service: Orthopedics;  Laterality: Left;   Patient Active Problem List   Diagnosis Date Noted   Status post total left knee replacement 11/01/2021    PCP: Seward Carol, MD  REFERRING PROVIDER: Mcarthur Rossetti, MD  REFERRING DIAG: [phone] (ICD-10-CM) - Chronic bilateral low back pain without sciatica  Rationale for Evaluation and Treatment: Rehabilitation  THERAPY DIAG:  Abnormal posture  Difficulty in walking, not elsewhere classified  Muscle weakness (generalized)  Other low back pain  ONSET DATE: Chronic  SUBJECTIVE:                                                                                                                                                                                           SUBJECTIVE STATEMENT: Estephanie reports partial HEP compliance.  She has questions with her current program that will be addressed today.  PERTINENT HISTORY:  OA, hypothyroid, Lt TKA 10/2021  PAIN:  NPRS scale:  4-5/10 this week Pain location: Low back Pain description: Ache Aggravating factors: Prolonged standing, walking Relieving factors: With sitting and lying down  PRECAUTIONS: Back  WEIGHT BEARING RESTRICTIONS: No  FALLS:  Has patient fallen in last 6 months? No  LIVING ENVIRONMENT: Lives with: By herself, uses a cane Lives in: House/apartment Stairs:  OK with rail Has following equipment at home: Single point cane and Grab bars  OCCUPATION: Retired  PLOF: Independent  PATIENT GOALS: Be able to stand and walk for longer periods of time, improve  posture, know what to    OBJECTIVE:   DIAGNOSTIC FINDINGS:  03/27/2022 review:   2017 study: Multilevel degenerative disc disease of the lumbar spine without compression fracture. Given the patient's progressive symptoms, lumbar spine MRI would be a useful next imaging step. Next item abdominal aortic atherosclerosis.  PATIENT SURVEYS:  03/27/2022 FOTO 40 (Goal 54 in 12 visits)  SCREENING FOR RED FLAGS: 03/27/2022 Bowel or bladder incontinence: No Spinal tumors: No Cauda equina syndrome: No Compression fracture: No  COGNITION: 03/27/2022 Overall cognitive status: Within functional limits for tasks assessed     SENSATION: 03/27/2022 No tingling or numbness noted  MUSCLE LENGTH: 03/27/2022 Hamstrings: Right 50 deg; Left 45 deg  POSTURE:  2/22/2024rounded shoulders, forward head, and decreased lumbar lordosis  LUMBAR ROM:   AROM 03/27/2022  Flexion   Extension 5  Right lateral flexion   Left lateral flexion   Right rotation   Left rotation    (Blank rows = not tested)  LOWER EXTREMITY ROM:     Passive  Left/Right 03/27/2022   Hip flexion 110/110   Hip extension    Hip abduction    Hip adduction    Hip internal rotation 17/17   Hip external rotation 13/23   Knee flexion    Knee extension    Ankle dorsiflexion    Ankle plantarflexion    Ankle inversion    Ankle eversion     (Blank rows = not  tested)  LOWER EXTREMITY STRENGTH:    MMT 03/27/2022   Hip flexion    Lumbar extension Deferred secondary to Tye's inability to assume testing postures   Hip abduction    Hip adduction    Hip internal rotation    Hip external rotation    Knee flexion    Knee extension    Ankle dorsiflexion    Ankle plantarflexion    Ankle inversion    Ankle eversion     (Blank rows = not tested)  GAIT: 03/27/2022 Distance walked: Within the clinic Assistive device utilized: Single point cane Level of assistance: Modified independence Comments: Henrie stands and walks in a very flexed posture  TODAY'S TREATMENT:                                                                DATE:  04/10/2022 Figure 4 stretch 5X 20 seconds Bil Shoulder blade pinches 10 x 5 seconds Lumbar extension AROM 10 x 3 seconds  Sit to stand slow eccentrics 5X Alternating hip hike 10X 3 seconds Row theraband 10X Green Shoulder extension theraband Red and Green 10X each bilateral Shoulder ER theraband with scapular retraction Green 10X bilateral  Functional Activities: Reviewed log and lumbar roll   04/08/2022 Therex: Nustep Lvl 6 12 mins UE/LE  Supine figure 4 push away stretch 15 sec x 3 bilateral Supine bridge 2-3 sec hold x 15 (added to home) Standing lumbar extension AROM x10 Tband rows green 2 x 10 c scapular retraction Tband gh ext green 2 x 10   Additional time spent in review of HEP and cues for improved knowledge and performance.     TODAY'S TREATMENT:  DATE: 03/27/2022  Figure 4 stretch 4X 20 seconds Bil Shoulder blade pinches 10 x 5 seconds Lumbar extension AROM 10 x 3 seconds  Functional activities: Discussed spine anatomy using a model, reviewed exam findings, discussed log and lumbar roll  PATIENT EDUCATION:  03/27/2022 Education details: See above Person educated: Patient and Child(ren) Education method: Explanation,  Demonstration, Tactile cues, Verbal cues, and Handouts Education comprehension: verbalized understanding, returned demonstration, verbal cues required, tactile cues required, and needs further education  HOME EXERCISE PROGRAM: Access Code: [email or web address] URL: [email or web address] Date: 04/10/2022 Prepared by: Vista Mink  Exercises - Supine Figure 4 Piriformis Stretch  - 1-2 x daily - 7 x weekly - 1 sets - 5 reps - 20 seconds hold - Standing Lumbar Extension at [place]  - 5 x daily - 7 x weekly - 1 sets - 5 reps - 3 seconds hold - Standing Scapular Retraction  - 5 x daily - 7 x weekly - 1 sets - 5 reps - 5 second hold - Supine Bridge  - 1-2 x daily - 7 x weekly - 1-2 sets - 10 reps - 2 hold - Standing Hip Hiking  - 3-5 x daily - 7 x weekly - 1 sets - 10 reps - 3 seconds hold - Sit to Stand with Armchair  - 5 x daily - 7 x weekly - 1 sets - 5 reps  ASSESSMENT:  CLINICAL IMPRESSION: Jillyn had good recall of day 1 and 2 exercises.  Added some more low back and leg strength activities to her current program.  Encouraged bridging along with activities completed today.  Again reviewed the possibility of pool exercises and Takerria does not seem to be too excited about the pool.  Continue postural correction, postural/back strengthening and functional activities to meet LTGs.  OBJECTIVE IMPAIRMENTS: Abnormal gait, decreased activity tolerance, decreased endurance, decreased knowledge of condition, difficulty walking, decreased ROM, decreased strength, decreased safety awareness, impaired perceived functional ability, impaired flexibility, improper body mechanics, postural dysfunction, and pain.   ACTIVITY LIMITATIONS: carrying, lifting, bending, sitting, standing, squatting, stairs, bed mobility, and locomotion level  PARTICIPATION LIMITATIONS: meal prep, cleaning, laundry, shopping, and community activity  PERSONAL FACTORS: OA, hypothyroid, Lt TKA 10/2021 are also affecting  patient's functional outcome.   REHAB POTENTIAL: Good  CLINICAL DECISION MAKING: Stable/uncomplicated  EVALUATION COMPLEXITY: Low   GOALS: Goals reviewed with patient? Yes  SHORT TERM GOALS: Target date: 04/24/2022  Jonette will be able to demonstrate correct sitting and standing posture along with correct use of a lumbar roll and correct body mechanics for getting in and out of bed Baseline: All areas above need to be addressed Goal status:  On going 04/10/2022  2.  Jaycelyn will be independent with her day 1 home exercise program Baseline: Started 03/27/2022 Goal status: On going 04/10/2022   LONG TERM GOALS: Target date: 05/22/2022  Improve FOTO to 54 in 12 visits her left Baseline: 40 Goal status: INITIAL  2.  Calandra will report low back pain consistently 0-3 out of 10 on the visual analog scale and will demonstrate the ability to manage symptoms when they increase with postural and exercise correction Baseline: As high as 6 out of 10 Goal status: On Going 04/10/2022  3.  Improve lumbar extension AROM to 10 degrees Baseline: 5 degrees Goal status: INITIAL  4.  Improve low back and postural strength as assessed by FOTO and self-reported function Baseline: See FOTO Goal status: INITIAL  5.  Elijah will be able to stand  and walk for longer periods of time without being stopped by low back pain Baseline: Limited at evaluation 03/27/2022 Goal status: On Going 04/10/2022  6.  Aubrea will be independent with her long-term home exercise program at discharge Baseline: Started 03/27/2022 Goal status: On Going 04/10/2022  PLAN:  PT FREQUENCY: 1-2x/week  PT DURATION: 8 weeks  PLANNED INTERVENTIONS: Therapeutic exercises, Therapeutic activity, Neuromuscular re-education, Balance training, Gait training, Patient/Family education, Self Care, Joint mobilization, Stair training, Cryotherapy, Moist heat, and Manual therapy.  PLAN FOR NEXT SESSION:  Continue postural, low back and  general lower extremity strengthening.  Will need new re-cert if she wants to add the pool (not too excited about the pool on 04/10/2022).  Farley Ly PT, MPT 04/10/22  3:34 PM

## 2022-04-15 ENCOUNTER — Encounter: Payer: Self-pay | Admitting: Rehabilitative and Restorative Service Providers"

## 2022-04-15 ENCOUNTER — Ambulatory Visit (INDEPENDENT_AMBULATORY_CARE_PROVIDER_SITE_OTHER): Payer: Medicare Other | Admitting: Rehabilitative and Restorative Service Providers"

## 2022-04-15 DIAGNOSIS — M6281 Muscle weakness (generalized): Secondary | ICD-10-CM

## 2022-04-15 DIAGNOSIS — R293 Abnormal posture: Secondary | ICD-10-CM | POA: Diagnosis not present

## 2022-04-15 DIAGNOSIS — R262 Difficulty in walking, not elsewhere classified: Secondary | ICD-10-CM

## 2022-04-15 DIAGNOSIS — M5459 Other low back pain: Secondary | ICD-10-CM | POA: Diagnosis not present

## 2022-04-15 NOTE — Therapy (Signed)
OUTPATIENT PHYSICAL THERAPY TREATMENT   Patient Name: Toni Medina MRN: [id number] DOB:March 27, 1931, 89 y.o., female Today's Date: 04/15/2022  END OF SESSION:  PT End of Session - 04/15/22 1307     Visit Number 4    Number of Visits 16    Date for PT Re-Evaluation 05/22/22    Authorization Type Medicare    Authorization - Visit Number --    Progress Note Due on Visit 10    PT Start Time 1257    PT Stop Time 1335    PT Time Calculation (min) 38 min    Activity Tolerance Patient limited by fatigue;Patient limited by pain    Behavior During Therapy [facility] for tasks assessed/performed                Past Medical History:  Diagnosis Date   Arthritis    Hypothyroidism    Past Surgical History:  Procedure Laterality Date   BREAST EXCISIONAL BIOPSY Left 2014   COLONOSCOPY     MENISCUS REPAIR     TOTAL KNEE ARTHROPLASTY Left 11/01/2021   Procedure: LEFT TOTAL KNEE ARTHROPLASTY;  Surgeon: Mcarthur Rossetti, MD;  Location: [facility];  Service: Orthopedics;  Laterality: Left;   Patient Active Problem List   Diagnosis Date Noted   Status post total left knee replacement 11/01/2021    PCP: Seward Carol, MD  REFERRING PROVIDER: Mcarthur Rossetti, MD  REFERRING DIAG: [phone] (ICD-10-CM) - Chronic bilateral low back pain without sciatica  Rationale for Evaluation and Treatment: Rehabilitation  THERAPY DIAG:  Abnormal posture  Difficulty in walking, not elsewhere classified  Muscle weakness (generalized)  Other low back pain  ONSET DATE: Chronic  SUBJECTIVE:                                                                                                                                                                                           SUBJECTIVE STATEMENT: She indicated having similar complaints with standing/walking activity related to back.  She indicated legs are doing well.    PERTINENT HISTORY:  OA, hypothyroid, Lt TKA 10/2021  PAIN:   NPRS scale: 4/10 at worst today.  Pain location: Low back Pain description: Ache Aggravating factors: Prolonged standing, walking Relieving factors: With sitting and lying down  PRECAUTIONS: Back  WEIGHT BEARING RESTRICTIONS: No  FALLS:  Has patient fallen in last 6 months? No  LIVING ENVIRONMENT: Lives with: By herself, uses a cane Lives in: House/apartment Stairs:  OK with rail Has following equipment at home: Single point cane and Grab bars  OCCUPATION: Retired  PLOF: Independent  PATIENT GOALS: Be able to stand and walk  for longer periods of time, improve posture, know what to    OBJECTIVE:   DIAGNOSTIC FINDINGS:  03/27/2022 review:   2017 study: Multilevel degenerative disc disease of the lumbar spine without compression fracture. Given the patient's progressive symptoms, lumbar spine MRI would be a useful next imaging step. Next item abdominal aortic atherosclerosis.  PATIENT SURVEYS:  03/27/2022 FOTO 40 (Goal 54 in 12 visits)  SCREENING FOR RED FLAGS: 03/27/2022 Bowel or bladder incontinence: No Spinal tumors: No Cauda equina syndrome: No Compression fracture: No  COGNITION: 03/27/2022 Overall cognitive status: Within functional limits for tasks assessed     SENSATION: 03/27/2022 No tingling or numbness noted  MUSCLE LENGTH: 03/27/2022 Hamstrings: Right 50 deg; Left 45 deg  POSTURE:  04/15/2022:  Continued forward trunk lean, increased thoracic kyphosis, reduced lumbar lordosis, forward head/protracted rounded shoulders bilateral.  Able to self correct for short period of time partially.   2/22/2024rounded shoulders, forward head, and decreased lumbar lordosis  LUMBAR ROM:   AROM 03/27/2022  Flexion   Extension 5  Right lateral flexion   Left lateral flexion   Right rotation   Left rotation    (Blank rows = not tested)  LOWER EXTREMITY ROM:     Passive  Left/Right 03/27/2022   Hip flexion 110/110   Hip extension    Hip abduction    Hip  adduction    Hip internal rotation 17/17   Hip external rotation 13/23   Knee flexion    Knee extension    Ankle dorsiflexion    Ankle plantarflexion    Ankle inversion    Ankle eversion     (Blank rows = not tested)  LOWER EXTREMITY STRENGTH:    MMT 03/27/2022   Hip flexion    Lumbar extension Deferred secondary to Toni Medina's inability to assume testing postures   Hip abduction    Hip adduction    Hip internal rotation    Hip external rotation    Knee flexion    Knee extension    Ankle dorsiflexion    Ankle plantarflexion    Ankle inversion    Ankle eversion     (Blank rows = not tested)  GAIT: 03/27/2022 Distance walked: Within the clinic Assistive device utilized: Single point cane Level of assistance: Modified independence Comments: Toni Medina stands and walks in a very flexed posture  TODAY'S TREATMENT:                                                                DATE 04/15/2022 Therex: Nustep Lvl 6 12 mins UE/LE  Standing lumbar extension AROM x5  Standing Tband rows green 2 x 15 c scapular retraction Standing Tband gh ext green 2 x 15  Shoulder ER theraband c towel under arm 2 x 10 red band , performed bilaterally  *Limited in standing time due to chief complaint.   Supine bridge c tband hold around knees x 15 (limited in distance on lift ) Supine clam shell movement c contralateral leg isometric hold green band x 20 bilateral Supine lumbar trunk rotation stretch 15 sec x 5 bilateral alternating  TODAY'S TREATMENT:  DATE: 04/10/2022 Figure 4 stretch 5X 20 seconds Bil Shoulder blade pinches 10 x 5 seconds Lumbar extension AROM 10 x 3 seconds  Sit to stand slow eccentrics 5X Alternating hip hike 10X 3 seconds Row theraband 10X Green Shoulder extension theraband Red and Green 10X each bilateral Shoulder ER theraband with scapular retraction Green 10X bilateral  Functional Activities: Reviewed log  and lumbar roll   TODAY'S TREATMENT:                                                                DATE3/06/2022 Therex: Nustep Lvl 6 12 mins UE/LE  Supine figure 4 push away stretch 15 sec x 3 bilateral Supine bridge 2-3 sec hold x 15 (added to home) Standing lumbar extension AROM x10 Tband rows green 2 x 10 c scapular retraction Tband gh ext green 2 x 10   Additional time spent in review of HEP and cues for improved knowledge and performance.     PATIENT EDUCATION:  03/27/2022 Education details: See above Person educated: Patient and Child(ren) Education method: Explanation, Demonstration, Tactile cues, Verbal cues, and Handouts Education comprehension: verbalized understanding, returned demonstration, verbal cues required, tactile cues required, and needs further education  HOME EXERCISE PROGRAM: Access Code: [email or web address] URL: [email or web address] Date: 04/10/2022 Prepared by: Vista Mink  Exercises - Supine Figure 4 Piriformis Stretch  - 1-2 x daily - 7 x weekly - 1 sets - 5 reps - 20 seconds hold - Standing Lumbar Extension at [place]  - 5 x daily - 7 x weekly - 1 sets - 5 reps - 3 seconds hold - Standing Scapular Retraction  - 5 x daily - 7 x weekly - 1 sets - 5 reps - 5 second hold - Supine Bridge  - 1-2 x daily - 7 x weekly - 1-2 sets - 10 reps - 2 hold - Standing Hip Hiking  - 3-5 x daily - 7 x weekly - 1 sets - 10 reps - 3 seconds hold - Sit to Stand with Armchair  - 5 x daily - 7 x weekly - 1 sets - 5 reps  ASSESSMENT:  CLINICAL IMPRESSION: Spent time in education and discussion about importance of a routine consistent approach to mobility intervention at home to help provide best management of symptoms as possible.  Detailed some cues for standing at store relief (one foot on bottom of cart for example) as ways to manage symptoms.  Pt to benefit most from good establishment of effective routine HEP for long term management of symptoms. When  achieved, transition to HEP only would be encouraged.   OBJECTIVE IMPAIRMENTS: Abnormal gait, decreased activity tolerance, decreased endurance, decreased knowledge of condition, difficulty walking, decreased ROM, decreased strength, decreased safety awareness, impaired perceived functional ability, impaired flexibility, improper body mechanics, postural dysfunction, and pain.   ACTIVITY LIMITATIONS: carrying, lifting, bending, sitting, standing, squatting, stairs, bed mobility, and locomotion level  PARTICIPATION LIMITATIONS: meal prep, cleaning, laundry, shopping, and community activity  PERSONAL FACTORS: OA, hypothyroid, Lt TKA 10/2021 are also affecting patient's functional outcome.   REHAB POTENTIAL: Good  CLINICAL DECISION MAKING: Stable/uncomplicated  EVALUATION COMPLEXITY: Low   GOALS: Goals reviewed with patient? Yes  SHORT TERM GOALS: Target date: 04/24/2022  Toni Medina will be able to demonstrate correct sitting and  standing posture along with correct use of a lumbar roll and correct body mechanics for getting in and out of bed Baseline: All areas above need to be addressed Goal status:  On going 04/10/2022  2.  Toni Medina will be independent with her day 1 home exercise program Baseline: Started 03/27/2022 Goal status: On going 04/10/2022   LONG TERM GOALS: Target date: 05/22/2022  Improve FOTO to 54 in 12 visits her left Baseline: 40 Goal status: INITIAL  2.  Toni Medina will report low back pain consistently 0-3 out of 10 on the visual analog scale and will demonstrate the ability to manage symptoms when they increase with postural and exercise correction Baseline: As high as 6 out of 10 Goal status: On Going 04/10/2022  3.  Improve lumbar extension AROM to 10 degrees Baseline: 5 degrees Goal status: INITIAL  4.  Improve low back and postural strength as assessed by FOTO and self-reported function Baseline: See FOTO Goal status: INITIAL  5.  Toni Medina will be able to stand  and walk for longer periods of time without being stopped by low back pain Baseline: Limited at evaluation 03/27/2022 Goal status: On Going 04/10/2022  6.  Toni Medina will be independent with her long-term home exercise program at discharge Baseline: Started 03/27/2022 Goal status: On Going 04/10/2022  PLAN:  PT FREQUENCY: 1-2x/week  PT DURATION: 8 weeks  PLANNED INTERVENTIONS: Therapeutic exercises, Therapeutic activity, Neuromuscular re-education, Balance training, Gait training, Patient/Family education, Self Care, Joint mobilization, Stair training, Cryotherapy, Moist heat, and Manual therapy.  PLAN FOR NEXT SESSION:  Pt to continue from focus on solid and routine HEP for symptom management.   Will need new re-cert if she wants to add the pool    Scot Jun, PT, DPT, OCS, ATC 04/15/22  1:34 PM

## 2022-04-17 ENCOUNTER — Ambulatory Visit (INDEPENDENT_AMBULATORY_CARE_PROVIDER_SITE_OTHER): Payer: Medicare Other | Admitting: Rehabilitative and Restorative Service Providers"

## 2022-04-17 ENCOUNTER — Encounter: Payer: Self-pay | Admitting: Rehabilitative and Restorative Service Providers"

## 2022-04-17 DIAGNOSIS — M6281 Muscle weakness (generalized): Secondary | ICD-10-CM

## 2022-04-17 DIAGNOSIS — M5459 Other low back pain: Secondary | ICD-10-CM

## 2022-04-17 DIAGNOSIS — R262 Difficulty in walking, not elsewhere classified: Secondary | ICD-10-CM | POA: Diagnosis not present

## 2022-04-17 DIAGNOSIS — R293 Abnormal posture: Secondary | ICD-10-CM

## 2022-04-17 NOTE — Therapy (Signed)
OUTPATIENT PHYSICAL THERAPY TREATMENT   Patient Name: Toni Medina MRN: [id number] DOB:12-31-1931, 89 y.o., female Today's Date: 04/17/2022  END OF SESSION:  PT End of Session - 04/17/22 1307     Visit Number 5    Number of Visits 16    Date for PT Re-Evaluation 05/22/22    Authorization Type Medicare    Authorization - Visit Number 5    Progress Note Due on Visit 10    PT Start Time [id number]    PT Stop Time 1346    PT Time Calculation (min) 40 min    Activity Tolerance Patient limited by fatigue;Patient tolerated treatment well;No increased pain    Behavior During Therapy WFL for tasks assessed/performed             Past Medical History:  Diagnosis Date   Arthritis    Hypothyroidism    Past Surgical History:  Procedure Laterality Date   BREAST EXCISIONAL BIOPSY Left 2014   COLONOSCOPY     MENISCUS REPAIR     TOTAL KNEE ARTHROPLASTY Left 11/01/2021   Procedure: LEFT TOTAL KNEE ARTHROPLASTY;  Surgeon: Mcarthur Rossetti, MD;  Location: [facility];  Service: Orthopedics;  Laterality: Left;   Patient Active Problem List   Diagnosis Date Noted   Status post total left knee replacement 11/01/2021    PCP: Seward Carol, MD  REFERRING PROVIDER: Mcarthur Rossetti, MD  REFERRING DIAG: [phone] (ICD-10-CM) - Chronic bilateral low back pain without sciatica  Rationale for Evaluation and Treatment: Rehabilitation  THERAPY DIAG:  Abnormal posture  Difficulty in walking, not elsewhere classified  Muscle weakness (generalized)  Other low back pain  ONSET DATE: Chronic  SUBJECTIVE:                                                                                                                                                                                           SUBJECTIVE STATEMENT: Joud reports "fair" HEP compliance.  She is taking short breaks during the day to rest and stretch to avoid increasing her low back pain.  PERTINENT HISTORY:  OA,  hypothyroid, Lt TKA 10/2021  PAIN:  NPRS scale: 0-4/10 this week Pain location: Low back Pain description: Ache Aggravating factors: Prolonged standing, walking Relieving factors: With sitting and lying down  PRECAUTIONS: Back  WEIGHT BEARING RESTRICTIONS: No  FALLS:  Has patient fallen in last 6 months? No  LIVING ENVIRONMENT: Lives with: By herself, uses a cane Lives in: House/apartment Stairs:  OK with rail Has following equipment at home: Single point cane and Grab bars  OCCUPATION: Retired  PLOF: Independent  PATIENT GOALS: Be able to stand  and walk for longer periods of time, improve posture, know what to    OBJECTIVE:   DIAGNOSTIC FINDINGS:  03/27/2022 review:   2017 study: Multilevel degenerative disc disease of the lumbar spine without compression fracture. Given the patient's progressive symptoms, lumbar spine MRI would be a useful next imaging step. Next item abdominal aortic atherosclerosis.  PATIENT SURVEYS:  03/27/2022 FOTO 40 (Goal 54 in 12 visits)  SCREENING FOR RED FLAGS: 03/27/2022 Bowel or bladder incontinence: No Spinal tumors: No Cauda equina syndrome: No Compression fracture: No  COGNITION: 03/27/2022 Overall cognitive status: Within functional limits for tasks assessed     SENSATION: 03/27/2022 No tingling or numbness noted  MUSCLE LENGTH: 03/27/2022 Hamstrings: Right 50 deg; Left 45 deg  POSTURE:  04/15/2022:  Continued forward trunk lean, increased thoracic kyphosis, reduced lumbar lordosis, forward head/protracted rounded shoulders bilateral.  Able to self correct for short period of time partially.   2/22/2024rounded shoulders, forward head, and decreased lumbar lordosis  LUMBAR ROM:   AROM 03/27/2022 04/16/2021  Flexion    Extension 5 5  Right lateral flexion    Left lateral flexion    Right rotation    Left rotation     (Blank rows = not tested)  LOWER EXTREMITY ROM:     Passive  Left/Right 03/27/2022 Left/Right   Hip  flexion 110/110   Hip extension    Hip abduction    Hip adduction    Hip internal rotation 17/17   Hip external rotation 13/23   Knee flexion    Knee extension    Ankle dorsiflexion    Ankle plantarflexion    Ankle inversion    Ankle eversion     (Blank rows = not tested)  LOWER EXTREMITY STRENGTH:    MMT 03/27/2022   Hip flexion    Lumbar extension Deferred secondary to Jaki's inability to assume testing postures   Hip abduction    Hip adduction    Hip internal rotation    Hip external rotation    Knee flexion    Knee extension    Ankle dorsiflexion    Ankle plantarflexion    Ankle inversion    Ankle eversion     (Blank rows = not tested)  GAIT: 03/27/2022 Distance walked: Within the clinic Assistive device utilized: Single point cane Level of assistance: Modified independence Comments: Allecia stands and walks in a very flexed posture  TODAY'S TREATMENT:                                                                 DATE  04/17/2022 Figure 4 stretch 5X 20 seconds Bil Shoulder blade pinches 10 x 5 seconds Lumbar extension AROM 10 x 3 seconds Bridging 2 sets of 10 for 3 seconds  Sit to stand slow eccentrics 5X Alternating hip hike 10X 3 seconds Row theraband 10X Green bilateral Shoulder ER theraband with scapular retraction Green 10X bilateral Shoulder extension, palms up Green 10X 3 seconds  Functional Activities: Reviewed log roll and the importance of avoiding flexion and flexion with rotation   04/15/2022 Therex: Nustep Lvl 6 12 mins UE/LE  Standing lumbar extension AROM x5  Standing Tband rows green 2 x 15 c scapular retraction Standing Tband gh ext green 2 x 15  Shoulder ER theraband  c towel under arm 2 x 10 red band , performed bilaterally  *Limited in standing time due to chief complaint.   Supine bridge c tband hold around knees x 15 (limited in distance on lift ) Supine clam shell movement c contralateral leg isometric hold green band x 20  bilateral Supine lumbar trunk rotation stretch 15 sec x 5 bilateral alternating   04/10/2022 Figure 4 stretch 5X 20 seconds Bil Shoulder blade pinches 10 x 5 seconds Lumbar extension AROM 10 x 3 seconds  Sit to stand slow eccentrics 5X Alternating hip hike 10X 3 seconds Row theraband 10X Green Shoulder extension theraband Red and Green 10X each bilateral Shoulder ER theraband with scapular retraction Green 10X bilateral  Functional Activities: Reviewed log and lumbar roll  PATIENT EDUCATION:  03/27/2022 Education details: See above Person educated: Patient and Child(ren) Education method: Explanation, Demonstration, Tactile cues, Verbal cues, and Handouts Education comprehension: verbalized understanding, returned demonstration, verbal cues required, tactile cues required, and needs further education  HOME EXERCISE PROGRAM: Access Code: [email or web address] URL: [email or web address] Date: 04/17/2022 Prepared by: Vista Mink  Exercises - Supine Figure 4 Piriformis Stretch  - 1-2 x daily - 7 x weekly - 1 sets - 5 reps - 20 seconds hold - Standing Lumbar Extension at [place]  - 5 x daily - 7 x weekly - 1 sets - 5 reps - 3 seconds hold - Standing Scapular Retraction  - 5 x daily - 7 x weekly - 1 sets - 5 reps - 5 second hold - Supine Bridge  - 1-2 x daily - 7 x weekly - 1-2 sets - 10 reps - 2 hold - Standing Hip Hiking  - 3-5 x daily - 7 x weekly - 1 sets - 10 reps - 3 seconds hold - Sit to Stand with Armchair  - 5 x daily - 7 x weekly - 1 sets - 5 reps  ASSESSMENT:  CLINICAL IMPRESSION: Lakedra is able to better manage her back pain with postural correction, external support and taking short breaks to rest and stretch/strengthen.  Increased compliance with her HEP will facilitate progress.  We discussed possibly asking about an x-ray when she sees Dr. Ninfa Linden next week to rule out any possible compression fractures as it appears her last imaging was a while ago.   Continue postural correction and postural strength emphasis to meet LTGs.   OBJECTIVE IMPAIRMENTS: Abnormal gait, decreased activity tolerance, decreased endurance, decreased knowledge of condition, difficulty walking, decreased ROM, decreased strength, decreased safety awareness, impaired perceived functional ability, impaired flexibility, improper body mechanics, postural dysfunction, and pain.   ACTIVITY LIMITATIONS: carrying, lifting, bending, sitting, standing, squatting, stairs, bed mobility, and locomotion level  PARTICIPATION LIMITATIONS: meal prep, cleaning, laundry, shopping, and community activity  PERSONAL FACTORS: OA, hypothyroid, Lt TKA 10/2021 are also affecting patient's functional outcome.   REHAB POTENTIAL: Good  CLINICAL DECISION MAKING: Stable/uncomplicated  EVALUATION COMPLEXITY: Low   GOALS: Goals reviewed with patient? Yes  SHORT TERM GOALS: Target date: 04/24/2022  Jaleena will be able to demonstrate correct sitting and standing posture along with correct use of a lumbar roll and correct body mechanics for getting in and out of bed Baseline: All areas above need to be addressed Goal status:  On going 04/17/2022  2.  Gertrudis will be independent with her day 1 home exercise program Baseline: Started 03/27/2022 Goal status: On going 04/17/2022   LONG TERM GOALS: Target date: 05/22/2022  Improve FOTO to 54 in 12 visits her left  Baseline: 40 Goal status: INITIAL  2.  Gracye will report low back pain consistently 0-3 out of 10 on the visual analog scale and will demonstrate the ability to manage symptoms when they increase with postural and exercise correction Baseline: As high as 6 out of 10 Goal status: On Going 04/17/2022  3.  Improve lumbar extension AROM to 10 degrees Baseline: 5 degrees Goal status: On Going 04/17/2022  4.  Improve low back and postural strength as assessed by FOTO and self-reported function Baseline: See FOTO Goal status: INITIAL  5.   Lacora will be able to stand and walk for longer periods of time without being stopped by low back pain Baseline: Limited at evaluation 03/27/2022 Goal status: On Going 04/17/2022  6.  Kileah will be independent with her long-term home exercise program at discharge Baseline: Started 03/27/2022 Goal status: On Going 04/17/2022  PLAN:  PT FREQUENCY: 1-2x/week  PT DURATION: 8 weeks  PLANNED INTERVENTIONS: Therapeutic exercises, Therapeutic activity, Neuromuscular re-education, Balance training, Gait training, Patient/Family education, Self Care, Joint mobilization, Stair training, Cryotherapy, Moist heat, and Manual therapy.  PLAN FOR NEXT SESSION: Postural strength progressions as appropriate.  Correct current HEP.  Review body mechanics and the importance of avoiding flexion.  Will need new re-cert if she wants to add the pool    Farley Ly PT, MPT  04/17/22  2:38 PM

## 2022-04-21 ENCOUNTER — Encounter: Payer: Self-pay | Admitting: Rehabilitative and Restorative Service Providers"

## 2022-04-21 ENCOUNTER — Ambulatory Visit (INDEPENDENT_AMBULATORY_CARE_PROVIDER_SITE_OTHER): Payer: Medicare Other | Admitting: Rehabilitative and Restorative Service Providers"

## 2022-04-21 DIAGNOSIS — M6281 Muscle weakness (generalized): Secondary | ICD-10-CM | POA: Diagnosis not present

## 2022-04-21 DIAGNOSIS — M5459 Other low back pain: Secondary | ICD-10-CM

## 2022-04-21 DIAGNOSIS — R262 Difficulty in walking, not elsewhere classified: Secondary | ICD-10-CM

## 2022-04-21 DIAGNOSIS — R293 Abnormal posture: Secondary | ICD-10-CM

## 2022-04-21 NOTE — Therapy (Signed)
OUTPATIENT PHYSICAL THERAPY TREATMENT   Patient Name: Toni Medina MRN: [id number] DOB:Sep 16, 1931, 89 y.o., female Today's Date: 04/21/2022  END OF SESSION:  PT End of Session - 04/21/22 1404     Visit Number 6    Number of Visits 16    Date for PT Re-Evaluation 05/22/22    Authorization Type Medicare    Progress Note Due on Visit 10    PT Start Time [id number]    PT Stop Time 1430    PT Time Calculation (min) 41 min    Activity Tolerance Patient limited by pain;Patient limited by fatigue    Behavior During Therapy [facility] for tasks assessed/performed              Past Medical History:  Diagnosis Date   Arthritis    Hypothyroidism    Past Surgical History:  Procedure Laterality Date   BREAST EXCISIONAL BIOPSY Left 2014   COLONOSCOPY     MENISCUS REPAIR     TOTAL KNEE ARTHROPLASTY Left 11/01/2021   Procedure: LEFT TOTAL KNEE ARTHROPLASTY;  Surgeon: Mcarthur Rossetti, MD;  Location: [facility];  Service: Orthopedics;  Laterality: Left;   Patient Active Problem List   Diagnosis Date Noted   Status post total left knee replacement 11/01/2021    PCP: Seward Carol, MD  REFERRING PROVIDER: Mcarthur Rossetti, MD  REFERRING DIAG: [phone] (ICD-10-CM) - Chronic bilateral low back pain without sciatica  Rationale for Evaluation and Treatment: Rehabilitation  THERAPY DIAG:  Abnormal posture  Difficulty in walking, not elsewhere classified  Muscle weakness (generalized)  Other low back pain  ONSET DATE: Chronic  SUBJECTIVE:                                                                                                                                                                                           SUBJECTIVE STATEMENT: She indicated continued complaints about the same overall in mid and lower back, primarily with standing/walking upright.  No radicular symptoms.  She indicated she didn't have questions about HEP today.   PERTINENT HISTORY:   OA, hypothyroid, Lt TKA 10/2021  PAIN:  NPRS scale: at worst in last few days:  5/10 Pain location: Low back Pain description: Ache Aggravating factors: Prolonged standing, walking Relieving factors: With sitting and lying down  PRECAUTIONS: Back  WEIGHT BEARING RESTRICTIONS: No  FALLS:  Has patient fallen in last 6 months? No  LIVING ENVIRONMENT: Lives with: By herself, uses a cane Lives in: House/apartment Stairs:  OK with rail Has following equipment at home: Single point cane and Grab bars  OCCUPATION: Retired  PLOF: Independent  PATIENT GOALS: Be  able to stand and walk for longer periods of time, improve posture, know what to    OBJECTIVE:   DIAGNOSTIC FINDINGS:  03/27/2022 review:   2017 study: Multilevel degenerative disc disease of the lumbar spine without compression fracture. Given the patient's progressive symptoms, lumbar spine MRI would be a useful next imaging step. Next item abdominal aortic atherosclerosis.  PATIENT SURVEYS:  04/21/2022  : FOTO:  46 (she actually reported worse on housework activity and transfers)  03/27/2022 FOTO 40 (Goal 54 in 12 visits)  SCREENING FOR RED FLAGS: 03/27/2022 Bowel or bladder incontinence: No Spinal tumors: No Cauda equina syndrome: No Compression fracture: No  COGNITION: 03/27/2022 Overall cognitive status: Within functional limits for tasks assessed     SENSATION: 03/27/2022 No tingling or numbness noted  MUSCLE LENGTH: 03/27/2022 Hamstrings: Right 50 deg; Left 45 deg  POSTURE:  04/15/2022:  Continued forward trunk lean, increased thoracic kyphosis, reduced lumbar lordosis, forward head/protracted rounded shoulders bilateral.  Able to self correct for short period of time partially.   2/22/2024rounded shoulders, forward head, and decreased lumbar lordosis  LUMBAR ROM:   AROM 03/27/2022 04/16/2021  Flexion    Extension 5 5  Right lateral flexion    Left lateral flexion    Right rotation    Left  rotation     (Blank rows = not tested)  LOWER EXTREMITY ROM:     Passive  Left/Right 03/27/2022 Left/Right   Hip flexion 110/110   Hip extension    Hip abduction    Hip adduction    Hip internal rotation 17/17   Hip external rotation 13/23   Knee flexion    Knee extension    Ankle dorsiflexion    Ankle plantarflexion    Ankle inversion    Ankle eversion     (Blank rows = not tested)  LOWER EXTREMITY STRENGTH:    MMT 03/27/2022   Hip flexion    Lumbar extension Deferred secondary to Tawanna's inability to assume testing postures   Hip abduction    Hip adduction    Hip internal rotation    Hip external rotation    Knee flexion    Knee extension    Ankle dorsiflexion    Ankle plantarflexion    Ankle inversion    Ankle eversion     (Blank rows = not tested)  GAIT: 03/27/2022 Distance walked: Within the clinic Assistive device utilized: Single point cane Level of assistance: Modified independence Comments: Dallyce stands and walks in a very flexed posture  TODAY'S TREATMENT:                                                                DATE 04/21/2022 Therex: Nustep Lvl 6 12 mins UE/LE  Standing lumbar extension AROM x5  Standing Tband rows green 2 x 15 c scapular retraction Standing Tband gh ext green 2 x 15   Extended time spent in POC review and evaluation of benefits of therapy and current presentation in prep for return to MD office this week.  Limited time billed in treatment today due to discussion.      TODAY'S TREATMENT:  DATE 04/17/2022 Figure 4 stretch 5X 20 seconds Bil Shoulder blade pinches 10 x 5 seconds Lumbar extension AROM 10 x 3 seconds Bridging 2 sets of 10 for 3 seconds  Sit to stand slow eccentrics 5X Alternating hip hike 10X 3 seconds Row theraband 10X Green bilateral Shoulder ER theraband with scapular retraction Green 10X bilateral Shoulder extension, palms up Green 10X 3  seconds  Functional Activities: Reviewed log roll and the importance of avoiding flexion and flexion with rotation   TODAY'S TREATMENT:                                                                DATE3/01/2023 Therex: Nustep Lvl 6 12 mins UE/LE  Standing lumbar extension AROM x5  Standing Tband rows green 2 x 15 c scapular retraction Standing Tband gh ext green 2 x 15  Shoulder ER theraband c towel under arm 2 x 10 red band , performed bilaterally  *Limited in standing time due to chief complaint.   Supine bridge c tband hold around knees x 15 (limited in distance on lift ) Supine clam shell movement c contralateral leg isometric hold green band x 20 bilateral Supine lumbar trunk rotation stretch 15 sec x 5 bilateral alternating   TODAY'S TREATMENT:                                                                DATE3/08/2022 Figure 4 stretch 5X 20 seconds Bil Shoulder blade pinches 10 x 5 seconds Lumbar extension AROM 10 x 3 seconds  Sit to stand slow eccentrics 5X Alternating hip hike 10X 3 seconds Row theraband 10X Green Shoulder extension theraband Red and Green 10X each bilateral Shoulder ER theraband with scapular retraction Green 10X bilateral  Functional Activities: Reviewed log and lumbar roll  PATIENT EDUCATION:  03/27/2022 Education details: See above Person educated: Patient and Child(ren) Education method: Explanation, Demonstration, Tactile cues, Verbal cues, and Handouts Education comprehension: verbalized understanding, returned demonstration, verbal cues required, tactile cues required, and needs further education  HOME EXERCISE PROGRAM: Access Code: [email or web address] URL: [email or web address] Date: 04/17/2022 Prepared by: Vista Mink  Exercises - Supine Figure 4 Piriformis Stretch  - 1-2 x daily - 7 x weekly - 1 sets - 5 reps - 20 seconds hold - Standing Lumbar Extension at [place]  - 5 x daily - 7 x weekly - 1 sets - 5 reps - 3  seconds hold - Standing Scapular Retraction  - 5 x daily - 7 x weekly - 1 sets - 5 reps - 5 second hold - Supine Bridge  - 1-2 x daily - 7 x weekly - 1-2 sets - 10 reps - 2 hold - Standing Hip Hiking  - 3-5 x daily - 7 x weekly - 1 sets - 10 reps - 3 seconds hold - Sit to Stand with Armchair  - 5 x daily - 7 x weekly - 1 sets - 5 reps  ASSESSMENT:  CLINICAL IMPRESSION: Due to detailed history of progressive degenerative changes in spine as well as some indications  of possible stenosis, encouraged Pt to return to MD for follow up with possible further investigation into condition.  At this time, HEP has served primarily as short term relief for symptoms at times but hasn't made substantial improvement in functional activity tolerance.   Extended time spent today in education of POC and steps going forward (MD return, trial HEP) if symptoms seem to plateau.    OBJECTIVE IMPAIRMENTS: Abnormal gait, decreased activity tolerance, decreased endurance, decreased knowledge of condition, difficulty walking, decreased ROM, decreased strength, decreased safety awareness, impaired perceived functional ability, impaired flexibility, improper body mechanics, postural dysfunction, and pain.   ACTIVITY LIMITATIONS: carrying, lifting, bending, sitting, standing, squatting, stairs, bed mobility, and locomotion level  PARTICIPATION LIMITATIONS: meal prep, cleaning, laundry, shopping, and community activity  PERSONAL FACTORS: OA, hypothyroid, Lt TKA 10/2021 are also affecting patient's functional outcome.   REHAB POTENTIAL: Good  CLINICAL DECISION MAKING: Stable/uncomplicated  EVALUATION COMPLEXITY: Low   GOALS: Goals reviewed with patient? Yes  SHORT TERM GOALS: Target date: 04/24/2022  Zeina will be able to demonstrate correct sitting and standing posture along with correct use of a lumbar roll and correct body mechanics for getting in and out of bed Baseline: All areas above need to be addressed Goal  status:  On going 04/21/2022  2.  Jazleen will be independent with her day 1 home exercise program Baseline: Started 03/27/2022 Goal status: On going 04/21/2022   LONG TERM GOALS: Target date: 05/22/2022  Improve FOTO to 54 in 12 visits her left Baseline: 40 Goal status: on going 04/21/2022  2.  Jonisha will report low back pain consistently 0-3 out of 10 on the visual analog scale and will demonstrate the ability to manage symptoms when they increase with postural and exercise correction Baseline: As high as 6 out of 10 Goal status: on going 04/21/2022  3.  Improve lumbar extension AROM to 10 degrees Baseline: 5 degrees Goal status: on going 04/21/2022  4.  Improve low back and postural strength as assessed by FOTO and self-reported function Baseline: See FOTO Goal status: on going 04/21/2022  5.  Zuria will be able to stand and walk for longer periods of time without being stopped by low back pain Baseline: Limited at evaluation 03/27/2022 Goal status:on going 04/21/2022  6.  Kazandra will be independent with her long-term home exercise program at discharge Baseline: Started 03/27/2022 Goal status: on going 04/21/2022  PLAN:  PT FREQUENCY: 1-2x/week  PT DURATION: 8 weeks  PLANNED INTERVENTIONS: Therapeutic exercises, Therapeutic activity, Neuromuscular re-education, Balance training, Gait training, Patient/Family education, Self Care, Joint mobilization, Stair training, Cryotherapy, Moist heat, and Manual therapy.  PLAN FOR NEXT SESSION: Prepare MD note.  Discussed whether continued in clinic activity is warranted after remaining visits (or trial HEP for management of HEP in conjunction with return to MD)   Scot Jun, PT, DPT, OCS, ATC 04/21/22  3:02 PM

## 2022-04-23 ENCOUNTER — Encounter: Payer: Medicare Other | Admitting: Rehabilitative and Restorative Service Providers"

## 2022-04-23 ENCOUNTER — Encounter: Payer: Self-pay | Admitting: Rehabilitative and Restorative Service Providers"

## 2022-04-23 ENCOUNTER — Ambulatory Visit (INDEPENDENT_AMBULATORY_CARE_PROVIDER_SITE_OTHER): Payer: Medicare Other | Admitting: Rehabilitative and Restorative Service Providers"

## 2022-04-23 DIAGNOSIS — R262 Difficulty in walking, not elsewhere classified: Secondary | ICD-10-CM

## 2022-04-23 DIAGNOSIS — M5459 Other low back pain: Secondary | ICD-10-CM | POA: Diagnosis not present

## 2022-04-23 DIAGNOSIS — R293 Abnormal posture: Secondary | ICD-10-CM | POA: Diagnosis not present

## 2022-04-23 DIAGNOSIS — M6281 Muscle weakness (generalized): Secondary | ICD-10-CM

## 2022-04-23 NOTE — Therapy (Signed)
OUTPATIENT PHYSICAL THERAPY TREATMENT/PROGRESS NOTE   Patient Name: Toni Medina MRN: [id number] DOB:[birth year], 88 y.o., female Today's Date: 04/23/2022  Progress Note Reporting Period 03/27/2022 to 04/23/2022  See note below for Objective Data and Assessment of Progress/Goals.     END OF SESSION:  PT End of Session - 04/23/22 1256     Visit Number 7    Number of Visits 16    Date for PT Re-Evaluation 05/22/22    Authorization Type Medicare    Authorization - Visit Number 7    Progress Note Due on Visit 10    PT Start Time [id number]    PT Stop Time 1339    PT Time Calculation (min) 44 min    Activity Tolerance Patient limited by fatigue;Patient tolerated treatment well;No increased pain    Behavior During Therapy WFL for tasks assessed/performed               Past Medical History:  Diagnosis Date   Arthritis    Hypothyroidism    Past Surgical History:  Procedure Laterality Date   BREAST EXCISIONAL BIOPSY Left 2014   COLONOSCOPY     MENISCUS REPAIR     TOTAL KNEE ARTHROPLASTY Left 11/01/2021   Procedure: LEFT TOTAL KNEE ARTHROPLASTY;  Surgeon: Mcarthur Rossetti, MD;  Location: [facility];  Service: Orthopedics;  Laterality: Left;   Patient Active Problem List   Diagnosis Date Noted   Status post total left knee replacement 11/01/2021    PCP: Seward Carol, MD  REFERRING PROVIDER: Mcarthur Rossetti, MD  REFERRING DIAG: [phone] (ICD-10-CM) - Chronic bilateral low back pain without sciatica  Rationale for Evaluation and Treatment: Rehabilitation  THERAPY DIAG:  Abnormal posture  Difficulty in walking, not elsewhere classified  Muscle weakness (generalized)  Other low back pain  ONSET DATE: Chronic  SUBJECTIVE:                                                                                                                                                                                           SUBJECTIVE STATEMENT: Toni Medina notes  better postural awareness and notes she is standing straighter than she was 4 weeks ago.  Standing endurance is limited by low back pain.  PERTINENT HISTORY:  OA, hypothyroid, Lt TKA 10/2021  PAIN:  NPRS scale: 0-5/10 over the past week Pain location: Low back Pain description: Ache Aggravating factors: Prolonged standing, walking Relieving factors: With sitting, postural correction and lying down  PRECAUTIONS: Back  WEIGHT BEARING RESTRICTIONS: No  FALLS:  Has patient fallen in last 6 months? No  LIVING ENVIRONMENT: Lives with: By herself, uses a cane Lives in:  House/apartment Stairs:  OK with rail Has following equipment at home: Single point cane and Grab bars  OCCUPATION: Retired  PLOF: Independent  PATIENT GOALS: Be able to stand and walk for longer periods of time, improve posture, know what to when she is hurting.   OBJECTIVE:   DIAGNOSTIC FINDINGS:  03/27/2022 review:   2017 study: Multilevel degenerative disc disease of the lumbar spine without compression fracture. Given the patient's progressive symptoms, lumbar spine MRI would be a useful next imaging step. Next item abdominal aortic atherosclerosis.  PATIENT SURVEYS:  04/21/2022: FOTO 46   03/27/2022 FOTO 40 (Goal 54 in 12 visits)  SCREENING FOR RED FLAGS: 03/27/2022 Bowel or bladder incontinence: No Spinal tumors: No Cauda equina syndrome: No Compression fracture: No  COGNITION: 03/27/2022 Overall cognitive status: Within functional limits for tasks assessed     SENSATION: 03/27/2022 No tingling or numbness noted  MUSCLE LENGTH: 04/23/2022  Hamstrings: Right 55 deg; Left 45 deg  03/27/2022 Hamstrings: Right 50 deg; Left 45 deg  POSTURE:  04/15/2022:  Continued forward trunk lean, increased thoracic kyphosis, reduced lumbar lordosis, forward head/protracted rounded shoulders bilateral.  Able to self correct for short period of time partially.   03/27/2022 rounded shoulders, forward head, and  decreased lumbar lordosis  LUMBAR ROM:   AROM 03/27/2022 04/16/2021  Flexion    Extension 5 5  Right lateral flexion    Left lateral flexion    Right rotation    Left rotation     (Blank rows = not tested)  LOWER EXTREMITY ROM:     Passive  Left/Right 03/27/2022 Left/Right 04/23/2022  Hip flexion 110/110 120/120  Hip extension    Hip abduction    Hip adduction    Hip internal rotation 17/17 18/25  Hip external rotation 13/23 21/25  Knee flexion    Knee extension    Ankle dorsiflexion    Ankle plantarflexion    Ankle inversion    Ankle eversion     (Blank rows = not tested)  LOWER EXTREMITY STRENGTH:    MMT 03/27/2022 04/23/2022  Hip flexion    Lumbar extension Deferred secondary to Toni Medina's inability to assume testing postures Able to assume test starting position but unable to hold arms and legs up against gravity  Hip abduction    Hip adduction    Hip internal rotation    Hip external rotation    Knee flexion    Knee extension    Ankle dorsiflexion    Ankle plantarflexion    Ankle inversion    Ankle eversion     (Blank rows = not tested)  GAIT: 03/27/2022 Distance walked: Within the clinic Assistive device utilized: Single point cane Level of assistance: Modified independence Comments: Toni Medina stands and walks in a very flexed posture  TODAY'S TREATMENT: DATE  04/23/2022 Figure 4 stretch 5X 20 seconds Bil Shoulder blade pinches 10 x 5 seconds Lumbar extension AROM 10 x 3 seconds Bridging 2 sets of 10 for 3 seconds Prone alternating hip extensions 2 sets 10 for 3 seconds Alternating hip hike 10X 3 seconds Progress note completed   04/21/2022 Therex: Nustep Lvl 6 12 mins UE/LE  Standing lumbar extension AROM x5  Standing Tband rows green 2 x 15 c scapular retraction Standing Tband gh ext green 2 x 15   Extended time spent in POC review and evaluation of benefits of therapy and current presentation in prep for return to MD office this week.  Limited  time billed in treatment today due to  discussion.    04/17/2022 Figure 4 stretch 5X 20 seconds Bil Shoulder blade pinches 10 x 5 seconds Lumbar extension AROM 10 x 3 seconds Bridging 2 sets of 10 for 3 seconds  Sit to stand slow eccentrics 5X Alternating hip hike 10X 3 seconds Row theraband 10X Green bilateral Shoulder ER theraband with scapular retraction Green 10X bilateral Shoulder extension, palms up Green 10X 3 seconds  Functional Activities: Reviewed log roll and the importance of avoiding flexion and flexion with rotation   PATIENT EDUCATION:  03/27/2022 Education details: See above Person educated: Patient and Child(ren) Education method: Explanation, Demonstration, Tactile cues, Verbal cues, and Handouts Education comprehension: verbalized understanding, returned demonstration, verbal cues required, tactile cues required, and needs further education  HOME EXERCISE PROGRAM: Access Code: [email or web address] URL: [email or web address] Date: 04/23/2022 Prepared by: Vista Mink  Exercises - Standing Lumbar Extension at [place] 5 x daily - 7 x weekly - 1 sets - 5 reps - 3 seconds hold - Standing Hip Hiking  - 2 x daily - 7 x weekly - 2 sets - 10 reps - 3 seconds hold - Prone Hip Extension  - 2 x daily - 7 x weekly - 2 sets - 10 reps - 3 seconds hold - Standing Scapular Retraction  - 5 x daily - 7 x weekly - 1 sets - 5 reps - 5 second hold - Yoga Bridge  - 2 x daily - 7 x weekly - 2 sets - 10 reps - 5 seconds hold - Supine Figure 4 Piriformis Stretch  - 2 x daily - 7 x weekly - 1 sets - 5 reps - 20 seconds hold  ASSESSMENT:  CLINICAL IMPRESSION: Toni Medina notes better postural awareness and that she can stand straighter as compared to evaluation.  Continuing to improve spine and postural strength will certainly benefit Toni Medina.  We discussed the possibility of an X-ray to look for any abnormalities (compression fractures).  Otherwise, she has a good HEP to  complement her current clinic program.  I expect Toni Medina to continue to progress towards meeting long-term goals.  Toni Medina asked for additional visits to get professional feedback as she continues to transition into independent rehabilitation.  OBJECTIVE IMPAIRMENTS: Abnormal gait, decreased activity tolerance, decreased endurance, decreased knowledge of condition, difficulty walking, decreased ROM, decreased strength, decreased safety awareness, impaired perceived functional ability, impaired flexibility, improper body mechanics, postural dysfunction, and pain.   ACTIVITY LIMITATIONS: carrying, lifting, bending, sitting, standing, squatting, stairs, bed mobility, and locomotion level  PARTICIPATION LIMITATIONS: meal prep, cleaning, laundry, shopping, and community activity  PERSONAL FACTORS: OA, hypothyroid, Lt TKA 10/2021 are also affecting patient's functional outcome.   REHAB POTENTIAL: Good  CLINICAL DECISION MAKING: Stable/uncomplicated  EVALUATION COMPLEXITY: Low   GOALS: Goals reviewed with patient? Yes  SHORT TERM GOALS: Target date: 04/24/2022  Toni Medina will be able to demonstrate correct sitting and standing posture along with correct use of a lumbar roll and correct body mechanics for getting in and out of bed Baseline: All areas above need to be addressed Goal status:  On going 04/23/2022  2.  Toni Medina will be independent with her day 1 home exercise program Baseline: Started 03/27/2022 Goal status: Met 04/23/2022   LONG TERM GOALS: Target date: 05/22/2022  Improve FOTO to 54 in 12 visits her left Baseline: 40 Goal status: On going 04/21/2022  2.  Toni Medina will report low back pain consistently 0-3 out of 10 on the visual analog scale and will demonstrate the ability to manage  symptoms when they increase with postural and exercise correction Baseline: As high as 6 out of 10 Goal status: On going 04/23/2022  3.  Improve lumbar extension AROM to 10 degrees Baseline: 5  degrees Goal status: On going 04/23/2022  4.  Improve low back and postural strength as assessed by FOTO and self-reported function Baseline: See FOTO Goal status: On going 04/23/2022  5.  Toni Medina will be able to stand and walk for longer periods of time without being stopped by low back pain Baseline: Limited at evaluation 03/27/2022 Goal status: On going 04/23/2022  6.  Toni Medina will be independent with her long-term home exercise program at discharge Baseline: Started 03/27/2022 Goal status: On going 04/23/2022  PLAN:  PT FREQUENCY: 1-2x/week  PT DURATION: 8 weeks  PLANNED INTERVENTIONS: Therapeutic exercises, Therapeutic activity, Neuromuscular re-education, Balance training, Gait training, Patient/Family education, Self Care, Joint mobilization, Stair training, Cryotherapy, Moist heat, and Manual therapy.  PLAN FOR NEXT SESSION: Review prone low back strength progressions added at her last visit and return to Thera-Band scapular and upper back strengthening exercises.  Farley Ly PT, MPT 04/23/22  5:08 PM

## 2022-04-24 ENCOUNTER — Other Ambulatory Visit (INDEPENDENT_AMBULATORY_CARE_PROVIDER_SITE_OTHER): Payer: Medicare Other

## 2022-04-24 ENCOUNTER — Ambulatory Visit (INDEPENDENT_AMBULATORY_CARE_PROVIDER_SITE_OTHER): Payer: Medicare Other | Admitting: Orthopaedic Surgery

## 2022-04-24 ENCOUNTER — Other Ambulatory Visit: Payer: Self-pay

## 2022-04-24 ENCOUNTER — Encounter: Payer: Self-pay | Admitting: Orthopaedic Surgery

## 2022-04-24 DIAGNOSIS — G8929 Other chronic pain: Secondary | ICD-10-CM

## 2022-04-24 DIAGNOSIS — Z96652 Presence of left artificial knee joint: Secondary | ICD-10-CM

## 2022-04-24 DIAGNOSIS — M545 Low back pain, unspecified: Secondary | ICD-10-CM

## 2022-04-24 NOTE — Progress Notes (Signed)
The patient is an 89 year old female who is now 6 months status post a left total knee replacement.  She said that her knee is doing well in terms of her range of motion and strength.  However she is still dealing with significant low back pain.  She does walk leaning forward and is lost her lumbar lordosis.  She does ambulate with a cane.  Her left operative knee has full range of motion.  There is no effusion.  The knee is ligamentously stable.  Her right knee has significant valgus malalignment and you can even see that when she walks.  However she is asymptomatic in terms of pain with her right knee.  She does have significant low back pain to palpation of the lower lumbar spine and does have a slight kyphosis.  2 views of the left operative knee show well-seated total knee arthroplasty with no complicating features.  2 views of the lumbar spine show complete loss of her lumbar lordosis.  There is severe degenerative changes at multiple levels from the mid to lower lumbar spine with loss of disc height which is almost complete at several levels.  This point I would like to send her for an MRI of her lumbar spine to assess the facet joints and for other pathology where she may benefit from steroid injections in her spine.  She is already going through physical therapy for her back and she will continue this in the interim.  All question concerns were answered and addressed.

## 2022-04-30 ENCOUNTER — Ambulatory Visit (INDEPENDENT_AMBULATORY_CARE_PROVIDER_SITE_OTHER): Payer: Medicare Other | Admitting: Rehabilitative and Restorative Service Providers"

## 2022-04-30 ENCOUNTER — Encounter: Payer: Self-pay | Admitting: Rehabilitative and Restorative Service Providers"

## 2022-04-30 DIAGNOSIS — R262 Difficulty in walking, not elsewhere classified: Secondary | ICD-10-CM | POA: Diagnosis not present

## 2022-04-30 DIAGNOSIS — M5459 Other low back pain: Secondary | ICD-10-CM

## 2022-04-30 DIAGNOSIS — M6281 Muscle weakness (generalized): Secondary | ICD-10-CM | POA: Diagnosis not present

## 2022-04-30 DIAGNOSIS — R293 Abnormal posture: Secondary | ICD-10-CM

## 2022-04-30 NOTE — Therapy (Signed)
OUTPATIENT PHYSICAL THERAPY TREATMENT NOTE   Patient Name: Toni Medina MRN: [id number] DOB:06-23-1931, 89 y.o., female Today's Date: 04/30/2022  END OF SESSION:  PT End of Session - 04/30/22 1300     Visit Number 8    Number of Visits 16    Date for PT Re-Evaluation 05/22/22    Authorization Type Medicare    Authorization - Visit Number 8    Progress Note Due on Visit 10    PT Start Time 1300    PT Stop Time 1345    PT Time Calculation (min) 45 min    Activity Tolerance Patient tolerated treatment well;No increased pain    Behavior During Therapy WFL for tasks assessed/performed             Past Medical History:  Diagnosis Date   Arthritis    Hypothyroidism    Past Surgical History:  Procedure Laterality Date   BREAST EXCISIONAL BIOPSY Left 2014   COLONOSCOPY     MENISCUS REPAIR     TOTAL KNEE ARTHROPLASTY Left 11/01/2021   Procedure: LEFT TOTAL KNEE ARTHROPLASTY;  Surgeon: Mcarthur Rossetti, MD;  Location: [facility];  Service: Orthopedics;  Laterality: Left;   Patient Active Problem List   Diagnosis Date Noted   Status post total left knee replacement 11/01/2021    PCP: Seward Carol, MD  REFERRING PROVIDER: Mcarthur Rossetti, MD  REFERRING DIAG: [phone] (ICD-10-CM) - Chronic bilateral low back pain without sciatica  Rationale for Evaluation and Treatment: Rehabilitation  THERAPY DIAG:  Abnormal posture  Difficulty in walking, not elsewhere classified  Muscle weakness (generalized)  Other low back pain  ONSET DATE: Chronic  SUBJECTIVE:                                                                                                                                                                                           SUBJECTIVE STATEMENT: Toni Medina notes continued good home exercise program compliance.  Standing endurance is limited by low back pain.  PERTINENT HISTORY:  OA, hypothyroid, Lt TKA 10/2021  PAIN:  NPRS scale:  0-5/10 over the past week Pain location: Low back Pain description: Ache Aggravating factors: Prolonged standing, walking Relieving factors: With sitting, postural correction and lying down  PRECAUTIONS: Back  WEIGHT BEARING RESTRICTIONS: No  FALLS:  Has patient fallen in last 6 months? No  LIVING ENVIRONMENT: Lives with: By herself, uses a cane Lives in: House/apartment Stairs:  OK with rail Has following equipment at home: Single point cane and Grab bars  OCCUPATION: Retired  PLOF: Independent  PATIENT GOALS: Be able to stand and walk for longer periods of  time, improve posture, know what to when she is hurting.   OBJECTIVE:   DIAGNOSTIC FINDINGS:  03/27/2022 review:   2017 study: Multilevel degenerative disc disease of the lumbar spine without compression fracture. Given the patient's progressive symptoms, lumbar spine MRI would be a useful next imaging step. Next item abdominal aortic atherosclerosis.  PATIENT SURVEYS:  04/21/2022: FOTO 46   03/27/2022 FOTO 40 (Goal 54 in 12 visits)  SCREENING FOR RED FLAGS: 03/27/2022 Bowel or bladder incontinence: No Spinal tumors: No Cauda equina syndrome: No Compression fracture: No  COGNITION: 03/27/2022 Overall cognitive status: Within functional limits for tasks assessed     SENSATION: 03/27/2022 No tingling or numbness noted  MUSCLE LENGTH: 04/23/2022  Hamstrings: Right 55 deg; Left 45 deg  03/27/2022 Hamstrings: Right 50 deg; Left 45 deg  POSTURE:  04/15/2022:  Continued forward trunk lean, increased thoracic kyphosis, reduced lumbar lordosis, forward head/protracted rounded shoulders bilateral.  Able to self correct for short period of time partially.   03/27/2022 rounded shoulders, forward head, and decreased lumbar lordosis  LUMBAR ROM:   AROM 03/27/2022 04/16/2021  Flexion    Extension 5 5  Right lateral flexion    Left lateral flexion    Right rotation    Left rotation     (Blank rows = not  tested)  LOWER EXTREMITY ROM:     Passive  Left/Right 03/27/2022 Left/Right 04/23/2022  Hip flexion 110/110 120/120  Hip extension    Hip abduction    Hip adduction    Hip internal rotation 17/17 18/25  Hip external rotation 13/23 21/25  Knee flexion    Knee extension    Ankle dorsiflexion    Ankle plantarflexion    Ankle inversion    Ankle eversion     (Blank rows = not tested)  LOWER EXTREMITY STRENGTH:    MMT 03/27/2022 04/23/2022  Hip flexion    Lumbar extension Deferred secondary to Toni Medina's inability to assume testing postures Able to assume test starting position but unable to hold arms and legs up against gravity  Hip abduction    Hip adduction    Hip internal rotation    Hip external rotation    Knee flexion    Knee extension    Ankle dorsiflexion    Ankle plantarflexion    Ankle inversion    Ankle eversion     (Blank rows = not tested)  GAIT: 03/27/2022 Distance walked: Within the clinic Assistive device utilized: Single point cane Level of assistance: Modified independence Comments: Toni Medina stands and walks in a very flexed posture  TODAY'S TREATMENT: DATE  04/30/2022 Figure 4 stretch 5X 20 seconds Bil Shoulder blade pinches 10 x 5 seconds Lumbar extension AROM 10 x 3 seconds Bridging 2 sets of 10 for 5 seconds Prone alternating hip extensions 2 sets 10 for 5 seconds Alternating hip hike 2 sets of 10 3 seconds  Pull to chest Blue 20X  Theraband shoulder ER bilateral 10X each Red Theraband extension shoulder palms up 10X 3 seconds Red   04/23/2022 Figure 4 stretch 5X 20 seconds Bil Shoulder blade pinches 10 x 5 seconds Lumbar extension AROM 10 x 3 seconds Bridging 2 sets of 10 for 3 seconds Prone alternating hip extensions 2 sets 10 for 3 seconds Alternating hip hike 10X 3 seconds Progress note completed   04/21/2022 Therex: Nustep Lvl 6 12 mins UE/LE  Standing lumbar extension AROM x5  Standing Tband rows green 2 x 15 c scapular  retraction Standing Tband gh ext green  2 x 15   Extended time spent in POC review and evaluation of benefits of therapy and current presentation in prep for return to MD office this week.  Limited time billed in treatment today due to discussion.    PATIENT EDUCATION:  03/27/2022 Education details: See above Person educated: Patient and Child(ren) Education method: Explanation, Demonstration, Tactile cues, Verbal cues, and Handouts Education comprehension: verbalized understanding, returned demonstration, verbal cues required, tactile cues required, and needs further education  HOME EXERCISE PROGRAM: Access Code: [email or web address] URL: [email or web address] Date: 04/30/2022 Prepared by: Vista Mink  Exercises - Standing Lumbar Extension at [place] 5 x daily - 7 x weekly - 1 sets - 5 reps - 3 seconds hold - Standing Hip Hiking  - 2 x daily - 7 x weekly - 2 sets - 10 reps - 3 seconds hold - Prone Hip Extension  - 2 x daily - 7 x weekly - 2 sets - 10 reps - 5 seconds hold - Standing Scapular Retraction  - 5 x daily - 7 x weekly - 1 sets - 5 reps - 5 second hold - Yoga Bridge  - 2 x daily - 7 x weekly - 2 sets - 10 reps - 5 seconds hold - Supine Figure 4 Piriformis Stretch  - 2 x daily - 7 x weekly - 1 sets - 5 reps - 20 seconds hold  ASSESSMENT:  CLINICAL IMPRESSION: Danett continues to note improved postural awareness and an improved ability to maintain good posture with ADLs.  She is able to stand and walk for longer periods of time before needing to stop and rest due to low back pain.  Significant low back weakness is noted with therapeutic exercises and Nate will continue to benefit from supervised rehabilitation to improve her strength and endurance with standing and weightbearing activities.   OBJECTIVE IMPAIRMENTS: Abnormal gait, decreased activity tolerance, decreased endurance, decreased knowledge of condition, difficulty walking, decreased ROM, decreased  strength, decreased safety awareness, impaired perceived functional ability, impaired flexibility, improper body mechanics, postural dysfunction, and pain.   ACTIVITY LIMITATIONS: carrying, lifting, bending, sitting, standing, squatting, stairs, bed mobility, and locomotion level  PARTICIPATION LIMITATIONS: meal prep, cleaning, laundry, shopping, and community activity  PERSONAL FACTORS: OA, hypothyroid, Lt TKA 10/2021 are also affecting patient's functional outcome.   REHAB POTENTIAL: Good  CLINICAL DECISION MAKING: Stable/uncomplicated  EVALUATION COMPLEXITY: Low   GOALS: Goals reviewed with patient? Yes  SHORT TERM GOALS: Target date: 04/24/2022  Onyx will be able to demonstrate correct sitting and standing posture along with correct use of a lumbar roll and correct body mechanics for getting in and out of bed Baseline: All areas above need to be addressed Goal status: Met 04/30/2022  2.  Margret will be independent with her day 1 home exercise program Baseline: Started 03/27/2022 Goal status: Met 04/23/2022   LONG TERM GOALS: Target date: 05/22/2022  Improve FOTO to 54 in 12 visits her left Baseline: 40 Goal status: On going 04/21/2022  2.  Janiah will report low back pain consistently 0-3 out of 10 on the visual analog scale and will demonstrate the ability to manage symptoms when they increase with postural and exercise correction Baseline: As high as 6 out of 10 Goal status: On going 04/30/2022  3.  Improve lumbar extension AROM to 10 degrees Baseline: 5 degrees Goal status: On going 04/23/2022  4.  Improve low back and postural strength as assessed by FOTO and self-reported function Baseline: See FOTO Goal  status: On going 04/23/2022  5.  Lenni will be able to stand and walk for longer periods of time without being stopped by low back pain Baseline: Limited at evaluation 03/27/2022 Goal status: On going 04/30/2022  6.  Aubrye will be independent with her  long-term home exercise program at discharge Baseline: Started 03/27/2022 Goal status: On going 04/30/2022  PLAN:  PT FREQUENCY: 1-2x/week  PT DURATION: 8 weeks  PLANNED INTERVENTIONS: Therapeutic exercises, Therapeutic activity, Neuromuscular re-education, Balance training, Gait training, Patient/Family education, Self Care, Joint mobilization, Stair training, Cryotherapy, Moist heat, and Manual therapy.  PLAN FOR NEXT SESSION: Prone low back strength progressions and progress scapular and upper back strengthening exercises.  Farley Ly PT, MPT 04/30/22  5:00 PM

## 2022-05-07 ENCOUNTER — Ambulatory Visit (INDEPENDENT_AMBULATORY_CARE_PROVIDER_SITE_OTHER): Payer: Medicare Other | Admitting: Rehabilitative and Restorative Service Providers"

## 2022-05-07 ENCOUNTER — Encounter: Payer: Self-pay | Admitting: Rehabilitative and Restorative Service Providers"

## 2022-05-07 DIAGNOSIS — M6281 Muscle weakness (generalized): Secondary | ICD-10-CM | POA: Diagnosis not present

## 2022-05-07 DIAGNOSIS — M5459 Other low back pain: Secondary | ICD-10-CM

## 2022-05-07 DIAGNOSIS — R293 Abnormal posture: Secondary | ICD-10-CM

## 2022-05-07 DIAGNOSIS — R262 Difficulty in walking, not elsewhere classified: Secondary | ICD-10-CM

## 2022-05-07 NOTE — Therapy (Signed)
OUTPATIENT PHYSICAL THERAPY TREATMENT NOTE   Patient Name: Toni Medina MRN: [id number] DOB:September 30, 1931, 89 y.o., female Today's Date: 05/07/2022  END OF SESSION:  PT End of Session - 05/07/22 1300     Visit Number 9    Number of Visits 16    Date for PT Re-Evaluation 05/22/22    Authorization Type Medicare    Authorization - Visit Number 9    Progress Note Due on Visit 16    PT Start Time [id number]    PT Stop Time [id number]    PT Time Calculation (min) 39 min    Activity Tolerance Patient tolerated treatment well;No increased pain;Patient limited by fatigue    Behavior During Therapy [facility] for tasks assessed/performed              Past Medical History:  Diagnosis Date   Arthritis    Hypothyroidism    Past Surgical History:  Procedure Laterality Date   BREAST EXCISIONAL BIOPSY Left 2014   COLONOSCOPY     MENISCUS REPAIR     TOTAL KNEE ARTHROPLASTY Left 11/01/2021   Procedure: LEFT TOTAL KNEE ARTHROPLASTY;  Surgeon: Mcarthur Rossetti, MD;  Location: [facility];  Service: Orthopedics;  Laterality: Left;   Patient Active Problem List   Diagnosis Date Noted   Status post total left knee replacement 11/01/2021    PCP: Seward Carol, MD  REFERRING PROVIDER: Mcarthur Rossetti, MD  REFERRING DIAG: [phone] (ICD-10-CM) - Chronic bilateral low back pain without sciatica  Rationale for Evaluation and Treatment: Rehabilitation  THERAPY DIAG:  Abnormal posture  Difficulty in walking, not elsewhere classified  Muscle weakness (generalized)  Other low back pain  ONSET DATE: Chronic  SUBJECTIVE:                                                                                                                                                                                           SUBJECTIVE STATEMENT: Jennis reports "fair" home exercise program compliance this week.  Standing endurance is limited by low back pain, but has improved from evaluation.  PERTINENT  HISTORY:  OA, hypothyroid, Lt TKA 10/2021  PAIN:  NPRS scale: 0-4/10 over the past week Pain location: Low back Pain description: Ache Aggravating factors: Prolonged standing, walking Relieving factors: With sitting, postural correction and lying down  PRECAUTIONS: Back  WEIGHT BEARING RESTRICTIONS: No  FALLS:  Has patient fallen in last 6 months? No  LIVING ENVIRONMENT: Lives with: By herself, uses a cane Lives in: House/apartment Stairs:  OK with rail Has following equipment at home: Single point cane and Grab bars  OCCUPATION: Retired  PLOF: [place]:  Be able to stand and walk for longer periods of time, improve posture, know what to when she is hurting.   OBJECTIVE:   DIAGNOSTIC FINDINGS:  03/27/2022 review:   2017 study: Multilevel degenerative disc disease of the lumbar spine without compression fracture. Given the patient's progressive symptoms, lumbar spine MRI would be a useful next imaging step. Next item abdominal aortic atherosclerosis.  PATIENT SURVEYS:  04/21/2022: FOTO 46   03/27/2022 FOTO 40 (Goal 54 in 12 visits)  SCREENING FOR RED FLAGS: 03/27/2022 Bowel or bladder incontinence: No Spinal tumors: No Cauda equina syndrome: No Compression fracture: No  COGNITION: 03/27/2022 Overall cognitive status: Within functional limits for tasks assessed     SENSATION: 03/27/2022 No tingling or numbness noted  MUSCLE LENGTH: 04/23/2022  Hamstrings: Right 55 deg; Left 45 deg  03/27/2022 Hamstrings: Right 50 deg; Left 45 deg  POSTURE:  04/15/2022:  Continued forward trunk lean, increased thoracic kyphosis, reduced lumbar lordosis, forward head/protracted rounded shoulders bilateral.  Able to self correct for short period of time partially.   03/27/2022 rounded shoulders, forward head, and decreased lumbar lordosis  LUMBAR ROM:   AROM 03/27/2022 04/16/2021  Flexion    Extension 5 5  Right lateral flexion    Left lateral flexion     Right rotation    Left rotation     (Blank rows = not tested)  LOWER EXTREMITY ROM:     Passive  Left/Right 03/27/2022 Left/Right 04/23/2022  Hip flexion 110/110 120/120  Hip extension    Hip abduction    Hip adduction    Hip internal rotation 17/17 18/25  Hip external rotation 13/23 21/25  Knee flexion    Knee extension    Ankle dorsiflexion    Ankle plantarflexion    Ankle inversion    Ankle eversion     (Blank rows = not tested)  LOWER EXTREMITY STRENGTH:    MMT 03/27/2022 04/23/2022  Hip flexion    Lumbar extension Deferred secondary to Rubie's inability to assume testing postures Able to assume test starting position but unable to hold arms and legs up against gravity  Hip abduction    Hip adduction    Hip internal rotation    Hip external rotation    Knee flexion    Knee extension    Ankle dorsiflexion    Ankle plantarflexion    Ankle inversion    Ankle eversion     (Blank rows = not tested)  GAIT: 03/27/2022 Distance walked: Within the clinic Assistive device utilized: Single point cane Level of assistance: Modified independence Comments: Brene stands and walks in a very flexed posture  TODAY'S TREATMENT: DATE  05/07/2022 Figure 4 stretch 5X 20 seconds Bil Shoulder blade pinches 10 x 5 seconds Lumbar extension AROM 10 x 3 seconds Bridging 2 sets of 10 for 5 seconds Prone alternating hip extensions 2 sets 10 for 5 seconds Alternating hip hike 2 sets of 10 3 seconds  Pull to chest Blue 20X  Theraband shoulder ER bilateral 10X each Red Theraband extension shoulder palms up 10X 3 seconds Red   04/30/2022 Figure 4 stretch 5X 20 seconds Bil Shoulder blade pinches 10 x 5 seconds Lumbar extension AROM 10 x 3 seconds Bridging 2 sets of 10 for 5 seconds Prone alternating hip extensions 2 sets 10 for 5 seconds Alternating hip hike 2 sets of 10 3 seconds  Pull to chest Blue 20X  Theraband shoulder ER bilateral 10X each Red Theraband extension shoulder  palms up 10X 3 seconds  Red   04/23/2022 Figure 4 stretch 5X 20 seconds Bil Shoulder blade pinches 10 x 5 seconds Lumbar extension AROM 10 x 3 seconds Bridging 2 sets of 10 for 3 seconds Prone alternating hip extensions 2 sets 10 for 3 seconds Alternating hip hike 10X 3 seconds Progress note completed   PATIENT EDUCATION:  03/27/2022 Education details: See above Person educated: Patient and Child(ren) Education method: Explanation, Demonstration, Tactile cues, Verbal cues, and Handouts Education comprehension: verbalized understanding, returned demonstration, verbal cues required, tactile cues required, and needs further education  HOME EXERCISE PROGRAM: Access Code: [email or web address] URL: [email or web address] Date: 04/30/2022 Prepared by: Vista Mink  Exercises - Standing Lumbar Extension at [place] 5 x daily - 7 x weekly - 1 sets - 5 reps - 3 seconds hold - Standing Hip Hiking  - 2 x daily - 7 x weekly - 2 sets - 10 reps - 3 seconds hold - Prone Hip Extension  - 2 x daily - 7 x weekly - 2 sets - 10 reps - 5 seconds hold - Standing Scapular Retraction  - 5 x daily - 7 x weekly - 1 sets - 5 reps - 5 second hold - Yoga Bridge  - 2 x daily - 7 x weekly - 2 sets - 10 reps - 5 seconds hold - Supine Figure 4 Piriformis Stretch  - 2 x daily - 7 x weekly - 1 sets - 5 reps - 20 seconds hold  ASSESSMENT:  CLINICAL IMPRESSION: Lyllah notes improved postural awareness and better endurance when maintaining good posture with ADLs.  She is able to stand and walk for longer periods of time before needing to stop and rest due to low back pain.  Significant low back weakness is being addressed with therapeutic exercises and Lashiya will continue to benefit from another 1-2 visits of supervised rehabilitation to improve her strength and endurance with standing and weightbearing activities, along with improving confidence with her long-term HEP.   OBJECTIVE IMPAIRMENTS: Abnormal  gait, decreased activity tolerance, decreased endurance, decreased knowledge of condition, difficulty walking, decreased ROM, decreased strength, decreased safety awareness, impaired perceived functional ability, impaired flexibility, improper body mechanics, postural dysfunction, and pain.   ACTIVITY LIMITATIONS: carrying, lifting, bending, sitting, standing, squatting, stairs, bed mobility, and locomotion level  PARTICIPATION LIMITATIONS: meal prep, cleaning, laundry, shopping, and community activity  PERSONAL FACTORS: OA, hypothyroid, Lt TKA 10/2021 are also affecting patient's functional outcome.   REHAB POTENTIAL: Good  CLINICAL DECISION MAKING: Stable/uncomplicated  EVALUATION COMPLEXITY: Low   GOALS: Goals reviewed with patient? Yes  SHORT TERM GOALS: Target date: 04/24/2022  Neftaly will be able to demonstrate correct sitting and standing posture along with correct use of a lumbar roll and correct body mechanics for getting in and out of bed Baseline: All areas above need to be addressed Goal status: Met 04/30/2022  2.  [place] will be independent with her day 1 home exercise program Baseline: Started 03/27/2022 Goal status: Met 04/23/2022   LONG TERM GOALS: Target date: 05/22/2022  Improve FOTO to 54 in 12 visits her left Baseline: 40 Goal status: On going 04/21/2022  2.  Azarea will report low back pain consistently 0-3 out of 10 on the visual analog scale and will demonstrate the ability to manage symptoms when they increase with postural and exercise correction Baseline: As high as 6 out of 10 Goal status: On going 05/07/2022  3.  Improve lumbar extension AROM to 10 degrees Baseline: 5 degrees Goal status: On  going 04/23/2022  4.  Improve low back and postural strength as assessed by FOTO and self-reported function Baseline: See FOTO Goal status: On going 04/23/2022  5.  Tanesha will be able to stand and walk for longer periods of time without being stopped by low  back pain Baseline: Limited at evaluation 03/27/2022 Goal status: On going 04/30/2022  6.  Keilei will be independent with her long-term home exercise program at discharge Baseline: Started 03/27/2022 Goal status: On going 05/07/2022  PLAN:  PT FREQUENCY: 1-2x/week  PT DURATION: 8 weeks  PLANNED INTERVENTIONS: Therapeutic exercises, Therapeutic activity, Neuromuscular re-education, Balance training, Gait training, Patient/Family education, Self Care, Joint mobilization, Stair training, Cryotherapy, Moist heat, and Manual therapy.  PLAN FOR NEXT SESSION: Increase comfort with HEP to prepare for independent rehabilitation in 2 weeks or less.  Farley Ly PT, MPT 05/07/22  1:40 PM

## 2022-05-14 ENCOUNTER — Encounter: Payer: Self-pay | Admitting: Rehabilitative and Restorative Service Providers"

## 2022-05-14 ENCOUNTER — Ambulatory Visit (INDEPENDENT_AMBULATORY_CARE_PROVIDER_SITE_OTHER): Payer: Medicare Other | Admitting: Rehabilitative and Restorative Service Providers"

## 2022-05-14 DIAGNOSIS — R293 Abnormal posture: Secondary | ICD-10-CM

## 2022-05-14 DIAGNOSIS — M5459 Other low back pain: Secondary | ICD-10-CM

## 2022-05-14 DIAGNOSIS — R262 Difficulty in walking, not elsewhere classified: Secondary | ICD-10-CM | POA: Diagnosis not present

## 2022-05-14 DIAGNOSIS — M6281 Muscle weakness (generalized): Secondary | ICD-10-CM

## 2022-05-14 NOTE — Therapy (Signed)
OUTPATIENT PHYSICAL THERAPY TREATMENT NOTE   Patient Name: Toni Medina MRN: [id number] DOB:12/28/1931, 89 y.o., female Today's Date: 05/14/2022  END OF SESSION:  PT End of Session - 05/14/22 1304     Visit Number 10    Number of Visits 16    Date for PT Re-Evaluation 05/22/22    Authorization Type Medicare    Authorization - Visit Number 10    Progress Note Due on Visit 16    PT Start Time 1303    PT Stop Time 1342    PT Time Calculation (min) 39 min    Activity Tolerance Patient tolerated treatment well;No increased pain    Behavior During Therapy WFL for tasks assessed/performed               Past Medical History:  Diagnosis Date   Arthritis    Hypothyroidism    Past Surgical History:  Procedure Laterality Date   BREAST EXCISIONAL BIOPSY Left 2014   COLONOSCOPY     MENISCUS REPAIR     TOTAL KNEE ARTHROPLASTY Left 11/01/2021   Procedure: LEFT TOTAL KNEE ARTHROPLASTY;  Surgeon: Kathryne Hitch, MD;  Location: [facility];  Service: Orthopedics;  Laterality: Left;   Patient Active Problem List   Diagnosis Date Noted   Status post total left knee replacement 11/01/2021    PCP: Renford Dills, MD  REFERRING PROVIDER: Kathryne Hitch, MD  REFERRING DIAG: [phone] (ICD-10-CM) - Chronic bilateral low back pain without sciatica  Rationale for Evaluation and Treatment: Rehabilitation  THERAPY DIAG:  Abnormal posture  Difficulty in walking, not elsewhere classified  Muscle weakness (generalized)  Other low back pain  ONSET DATE: Chronic  SUBJECTIVE:                                                                                                                                                                                           SUBJECTIVE STATEMENT: Toni Medina reports a bit more consistency with her home exercise program compliance this week.  Standing endurance is limited by low back pain, but has improved from  evaluation.  PERTINENT HISTORY:  OA, hypothyroid, Lt TKA 10/2021  PAIN:  NPRS scale: 0-4/10 over the past week Pain location: Low back Pain description: Ache Aggravating factors: Prolonged standing, walking Relieving factors: With sitting, postural correction and lying down  PRECAUTIONS: Back  WEIGHT BEARING RESTRICTIONS: No  FALLS:  Has patient fallen in last 6 months? No  LIVING ENVIRONMENT: Lives with: By herself, uses a cane Lives in: House/apartment Stairs:  OK with rail Has following equipment at home: Single point cane and Grab bars  OCCUPATION: Retired  PLOF: Independent  PATIENT GOALS: Be able to stand and walk for longer periods of time, improve posture, know what to when she is hurting.   OBJECTIVE:   DIAGNOSTIC FINDINGS:  03/27/2022 review:   2017 study: Multilevel degenerative disc disease of the lumbar spine without compression fracture. Given the patient's progressive symptoms, lumbar spine MRI would be a useful next imaging step. Next item abdominal aortic atherosclerosis.  PATIENT SURVEYS:  04/21/2022: FOTO 46   03/27/2022 FOTO 40 (Goal 54 in 12 visits)  SCREENING FOR RED FLAGS: 03/27/2022 Bowel or bladder incontinence: No Spinal tumors: No Cauda equina syndrome: No Compression fracture: No  COGNITION: 03/27/2022 Overall cognitive status: Within functional limits for tasks assessed     SENSATION: 03/27/2022 No tingling or numbness noted  MUSCLE LENGTH: 04/23/2022  Hamstrings: Right 55 deg; Left 45 deg  03/27/2022 Hamstrings: Right 50 deg; Left 45 deg  POSTURE:  04/15/2022:  Continued forward trunk lean, increased thoracic kyphosis, reduced lumbar lordosis, forward head/protracted rounded shoulders bilateral.  Able to self correct for short period of time partially.   03/27/2022 rounded shoulders, forward head, and decreased lumbar lordosis  LUMBAR ROM:   AROM 03/27/2022 04/16/2021  Flexion    Extension 5 5  Right lateral flexion     Left lateral flexion    Right rotation    Left rotation     (Blank rows = not tested)  LOWER EXTREMITY ROM:     Passive  Left/Right 03/27/2022 Left/Right 04/23/2022  Hip flexion 110/110 120/120  Hip extension    Hip abduction    Hip adduction    Hip internal rotation 17/17 18/25  Hip external rotation 13/23 21/25  Knee flexion    Knee extension    Ankle dorsiflexion    Ankle plantarflexion    Ankle inversion    Ankle eversion     (Blank rows = not tested)  LOWER EXTREMITY STRENGTH:    MMT 03/27/2022 04/23/2022  Hip flexion    Lumbar extension Deferred secondary to Toni Medina's inability to assume testing postures Able to assume test starting position but unable to hold arms and legs up against gravity  Hip abduction    Hip adduction    Hip internal rotation    Hip external rotation    Knee flexion    Knee extension    Ankle dorsiflexion    Ankle plantarflexion    Ankle inversion    Ankle eversion     (Blank rows = not tested)  GAIT: 03/27/2022 Distance walked: Within the clinic Assistive device utilized: Single point cane Level of assistance: Modified independence Comments: Toni Medina stands and walks in a very flexed posture  TODAY'S TREATMENT: DATE  05/14/2022 Figure 4 stretch 5X 20 seconds Bil (needed feedback for hold time) Shoulder blade pinches 10 x 5 seconds Lumbar extension AROM 10 x 3 seconds Bridging 2 sets of 10 for 5 seconds Prone alternating hip extensions 2 sets 10 for 5 seconds Alternating hip hike 2 sets of 10 5 seconds  Pull to chest Blue 20X  Theraband shoulder ER bilateral 10X each Red Theraband extension shoulder palms up 10X 3 seconds Red   05/07/2022 Figure 4 stretch 5X 20 seconds Bil Shoulder blade pinches 10 x 5 seconds Lumbar extension AROM 10 x 3 seconds Bridging 2 sets of 10 for 5 seconds Prone alternating hip extensions 2 sets 10 for 5 seconds Alternating hip hike 2 sets of 10 3 seconds  Pull to chest Blue 20X  Theraband shoulder  ER bilateral 10X each Red Theraband  extension shoulder palms up 10X 3 seconds Red   04/30/2022 Figure 4 stretch 5X 20 seconds Bil Shoulder blade pinches 10 x 5 seconds Lumbar extension AROM 10 x 3 seconds Bridging 2 sets of 10 for 5 seconds Prone alternating hip extensions 2 sets 10 for 5 seconds Alternating hip hike 2 sets of 10 3 seconds  Pull to chest Blue 20X  Theraband shoulder ER bilateral 10X each Red Theraband extension shoulder palms up 10X 3 seconds Red   PATIENT EDUCATION:  03/27/2022 Education details: See above Person educated: Patient and Child(ren) Education method: Explanation, Demonstration, Tactile cues, Verbal cues, and Handouts Education comprehension: verbalized understanding, returned demonstration, verbal cues required, tactile cues required, and needs further education  HOME EXERCISE PROGRAM: Access Code: [email or web address] URL: [email or web address] Date: 04/30/2022 Prepared by: Pauletta Browns  Exercises - Standing Lumbar Extension at Wall - Forearms  - 5 x daily - 7 x weekly - 1 sets - 5 reps - 3 seconds hold - Standing Hip Hiking  - 2 x daily - 7 x weekly - 2 sets - 10 reps - 3 seconds hold - Prone Hip Extension  - 2 x daily - 7 x weekly - 2 sets - 10 reps - 5 seconds hold - Standing Scapular Retraction  - 5 x daily - 7 x weekly - 1 sets - 5 reps - 5 second hold - Yoga Bridge  - 2 x daily - 7 x weekly - 2 sets - 10 reps - 5 seconds hold - Supine Figure 4 Piriformis Stretch  - 2 x daily - 7 x weekly - 1 sets - 5 reps - 20 seconds hold  ASSESSMENT:  CLINICAL IMPRESSION: Yazlene is doing a good job with her home exercises and has a much better awareness of her posture.  She occasionally needs feedback with body mechanics and occasional correction with her home exercises.  We discussed possibly transitioning her into independent rehabilitation after her next visit if she is comfortable with her current program which appears to be addressing her major  impairments.   OBJECTIVE IMPAIRMENTS: Abnormal gait, decreased activity tolerance, decreased endurance, decreased knowledge of condition, difficulty walking, decreased ROM, decreased strength, decreased safety awareness, impaired perceived functional ability, impaired flexibility, improper body mechanics, postural dysfunction, and pain.   ACTIVITY LIMITATIONS: carrying, lifting, bending, sitting, standing, squatting, stairs, bed mobility, and locomotion level  PARTICIPATION LIMITATIONS: meal prep, cleaning, laundry, shopping, and community activity  PERSONAL FACTORS: OA, hypothyroid, Lt TKA 10/2021 are also affecting patient's functional outcome.   REHAB POTENTIAL: Good  CLINICAL DECISION MAKING: Stable/uncomplicated  EVALUATION COMPLEXITY: Low   GOALS: Goals reviewed with patient? Yes  SHORT TERM GOALS: Target date: 04/24/2022  Takela will be able to demonstrate correct sitting and standing posture along with correct use of a lumbar roll and correct body mechanics for getting in and out of bed Baseline: All areas above need to be addressed Goal status: Met 04/30/2022  2.  Cale will be independent with her day 1 home exercise program Baseline: Started 03/27/2022 Goal status: Met 04/23/2022   LONG TERM GOALS: Target date: 05/22/2022  Improve FOTO to 54 in 12 visits her left Baseline: 40 Goal status: On going 04/21/2022  2.  Lace will report low back pain consistently 0-3 out of 10 on the visual analog scale and will demonstrate the ability to manage symptoms when they increase with postural and exercise correction Baseline: As high as 6 out of 10 Goal status: On going 05/14/2022  3.  Improve  lumbar extension AROM to 10 degrees Baseline: 5 degrees Goal status: On going 04/23/2022  4.  Improve low back and postural strength as assessed by FOTO and self-reported function Baseline: See FOTO Goal status: On going 05/14/2022  5.  Sabel will be able to stand and walk for  longer periods of time without being stopped by low back pain Baseline: Limited at evaluation 03/27/2022 Goal status: On going 05/14/2022  6.  Jennfier will be independent with her long-term home exercise program at discharge Baseline: Started 03/27/2022 Goal status: On going 05/14/2022  PLAN:  PT FREQUENCY: 1-2x/week  PT DURATION: 8 weeks  PLANNED INTERVENTIONS: Therapeutic exercises, Therapeutic activity, Neuromuscular re-education, Balance training, Gait training, Patient/Family education, Self Care, Joint mobilization, Stair training, Cryotherapy, Moist heat, and Manual therapy.  PLAN FOR NEXT SESSION: FOTO, progress note and consider DC into independent rehabilitation.  Cherlyn Cushing PT, MPT 05/14/22  5:14 PM

## 2022-05-20 ENCOUNTER — Ambulatory Visit
Admission: RE | Admit: 2022-05-20 | Discharge: 2022-05-20 | Disposition: A | Discharge status: Home / Self Care | Payer: Medicare Other | Source: Ambulatory Visit | Attending: Orthopaedic Surgery | Admitting: Orthopaedic Surgery

## 2022-05-20 DIAGNOSIS — M545 Low back pain, unspecified: Secondary | ICD-10-CM

## 2022-05-20 DIAGNOSIS — M48061 Spinal stenosis, lumbar region without neurogenic claudication: Secondary | ICD-10-CM | POA: Diagnosis not present

## 2022-05-21 ENCOUNTER — Encounter: Payer: Self-pay | Admitting: Rehabilitative and Restorative Service Providers"

## 2022-05-21 ENCOUNTER — Ambulatory Visit (INDEPENDENT_AMBULATORY_CARE_PROVIDER_SITE_OTHER): Payer: Medicare Other | Admitting: Rehabilitative and Restorative Service Providers"

## 2022-05-21 DIAGNOSIS — R262 Difficulty in walking, not elsewhere classified: Secondary | ICD-10-CM | POA: Diagnosis not present

## 2022-05-21 DIAGNOSIS — R293 Abnormal posture: Secondary | ICD-10-CM

## 2022-05-21 DIAGNOSIS — M6281 Muscle weakness (generalized): Secondary | ICD-10-CM | POA: Diagnosis not present

## 2022-05-21 DIAGNOSIS — M5459 Other low back pain: Secondary | ICD-10-CM | POA: Diagnosis not present

## 2022-05-21 NOTE — Therapy (Signed)
OUTPATIENT PHYSICAL THERAPY TREATMENT/DISCHARGE NOTE   Patient Name: Toni Medina MRN: [id number] DOB:05-Jun-1931, 89 y.o., female Today's Date: 05/21/2022  END OF SESSION:  PT End of Session - 05/21/22 1301     Visit Number 11    Number of Visits 16    Date for PT Re-Evaluation 05/22/22    Authorization Type Medicare    Authorization - Visit Number 11    Progress Note Due on Visit 16    PT Start Time 1259    PT Stop Time 1345    PT Time Calculation (min) 46 min    Activity Tolerance Patient tolerated treatment well;No increased pain    Behavior During Therapy WFL for tasks assessed/performed           PHYSICAL THERAPY DISCHARGE SUMMARY  Visits from Start of Care: 11  Current functional level related to goals / functional outcomes: See note   Remaining deficits: See note   Education / Equipment: Updated/reviewed HEP   Patient agrees to discharge. Patient goals were met. Patient is being discharged due to meeting the stated rehab goals.    Past Medical History:  Diagnosis Date   Arthritis    Hypothyroidism    Past Surgical History:  Procedure Laterality Date   BREAST EXCISIONAL BIOPSY Left 2014   COLONOSCOPY     MENISCUS REPAIR     TOTAL KNEE ARTHROPLASTY Left 11/01/2021   Procedure: LEFT TOTAL KNEE ARTHROPLASTY;  Surgeon: Toni Hitch, MD;  Location: [facility];  Service: Orthopedics;  Laterality: Left;   Patient Active Problem List   Diagnosis Date Noted   Status post total left knee replacement 11/01/2021    PCP: Toni Dills, MD  REFERRING PROVIDER: Kathryne Hitch, MD  REFERRING DIAG: [phone] (ICD-10-CM) - Chronic bilateral low back pain without sciatica  Rationale for Evaluation and Treatment: Rehabilitation  THERAPY DIAG:  Abnormal posture  Difficulty in walking, not elsewhere classified  Muscle weakness (generalized)  Other low back pain  ONSET DATE: Chronic  SUBJECTIVE:                                                                                                                                                                                            SUBJECTIVE STATEMENT: Toni Medina reports "reasonable" compliance with her home exercise program this week.  Standing endurance is limited by low back pain, but has improved from evaluation.  PERTINENT HISTORY:  OA, hypothyroid, Lt TKA 10/2021  PAIN:  NPRS scale: 0-3/10 over the past week Pain location: Low back Pain description: Ache Aggravating factors: Prolonged standing, walking Relieving factors: With sitting, postural correction and lying down  PRECAUTIONS: Back  WEIGHT BEARING RESTRICTIONS: No  FALLS:  Has patient fallen in last 6 months? No  LIVING ENVIRONMENT: Lives with: By herself, uses a cane Lives in: House/apartment Stairs:  OK with rail Has following equipment at home: Single point cane and Grab bars  OCCUPATION: Retired  PLOF: Independent  PATIENT GOALS: Be able to stand and walk for longer periods of time, improve posture, know what to when she is hurting.   OBJECTIVE:   DIAGNOSTIC FINDINGS:  03/27/2022 review:   2017 study: Multilevel degenerative disc disease of the lumbar spine without compression fracture. Given the patient's progressive symptoms, lumbar spine MRI would be a useful next imaging step. Next item abdominal aortic atherosclerosis.  PATIENT SURVEYS:  05/21/2022: FOTO 61 (Goal met)  04/21/2022: FOTO 46   03/27/2022 FOTO 40 (Goal 54 in 12 visits)  SCREENING FOR RED FLAGS: 03/27/2022 Bowel or bladder incontinence: No Spinal tumors: No Cauda equina syndrome: No Compression fracture: No  COGNITION: 03/27/2022 Overall cognitive status: Within functional limits for tasks assessed     SENSATION: 03/27/2022 No tingling or numbness noted  MUSCLE LENGTH: 05/21/2022  Hamstrings: Right 55 deg; Left 45 deg  04/23/2022  Hamstrings: Right 55 deg; Left 45 deg  03/27/2022 Hamstrings: Right 50  deg; Left 45 deg  POSTURE:  04/15/2022:  Continued forward trunk lean, increased thoracic kyphosis, reduced lumbar lordosis, forward head/protracted rounded shoulders bilateral.  Able to self correct for short period of time partially.   03/27/2022 rounded shoulders, forward head, and decreased lumbar lordosis  LUMBAR ROM:   AROM 03/27/2022 04/16/2021 05/21/2022  Flexion     Extension 5 5 10   Right lateral flexion     Left lateral flexion     Right rotation     Left rotation      (Blank rows = not tested)  LOWER EXTREMITY ROM:     Passive  Left/Right 03/27/2022 Left/Right 04/23/2022 Left/Right 05/21/2022  Hip flexion 110/110 120/120 120/120  Hip extension     Hip abduction     Hip adduction     Hip internal rotation 17/17 18/25 30/25   Hip external rotation 13/23 21/25 18/29   Knee flexion     Knee extension     Ankle dorsiflexion     Ankle plantarflexion     Ankle inversion     Ankle eversion      (Blank rows = not tested)  LOWER EXTREMITY STRENGTH:    MMT 03/27/2022 04/23/2022 05/21/2022  Hip flexion     Lumbar extension Deferred secondary to Toni Medina's inability to assume testing postures Able to assume test starting position but unable to hold arms and legs up against gravity Similar to 04/23/2022 but able to raise arms and legs a bit  Hip abduction     Hip adduction     Hip internal rotation     Hip external rotation     Knee flexion     Knee extension     Ankle dorsiflexion     Ankle plantarflexion     Ankle inversion     Ankle eversion      (Blank rows = not tested)  GAIT: 03/27/2022 Distance walked: Within the clinic Assistive device utilized: Single point cane Level of assistance: Modified independence Comments: Toni Medina stands and walks in a very flexed posture  TODAY'S TREATMENT: DATE  05/21/2022 Figure 4 stretch 5X 20 seconds Bil (needed feedback for hold time) Shoulder blade pinches 10 x 5 seconds Lumbar extension AROM 10 x 3 seconds Bridging 2  sets of 10  for 5 seconds Prone alternating hip extensions 2 sets 10 for 5 seconds Alternating hip hike 2 sets of 10 5 seconds  Reassessment & progress/DC note   05/14/2022 Figure 4 stretch 5X 20 seconds Bil (needed feedback for hold time) Shoulder blade pinches 10 x 5 seconds Lumbar extension AROM 10 x 3 seconds Bridging 2 sets of 10 for 5 seconds Prone alternating hip extensions 2 sets 10 for 5 seconds Alternating hip hike 2 sets of 10 5 seconds  Pull to chest Blue 20X  Theraband shoulder ER bilateral 10X each Red Theraband extension shoulder palms up 10X 3 seconds Red   05/07/2022 Figure 4 stretch 5X 20 seconds Bil Shoulder blade pinches 10 x 5 seconds Lumbar extension AROM 10 x 3 seconds Bridging 2 sets of 10 for 5 seconds Prone alternating hip extensions 2 sets 10 for 5 seconds Alternating hip hike 2 sets of 10 3 seconds  Pull to chest Blue 20X  Theraband shoulder ER bilateral 10X each Red Theraband extension shoulder palms up 10X 3 seconds Red   PATIENT EDUCATION:  03/27/2022 Education details: See above Person educated: Patient and Child(ren) Education method: Explanation, Demonstration, Tactile cues, Verbal cues, and Handouts Education comprehension: verbalized understanding, returned demonstration, verbal cues required, tactile cues required, and needs further education  HOME EXERCISE PROGRAM: Access Code: [email or web address] URL: [email or web address] Date: 04/30/2022 Prepared by: Pauletta Browns  Exercises - Standing Lumbar Extension at Wall - Forearms  - 5 x daily - 7 x weekly - 1 sets - 5 reps - 3 seconds hold - Standing Hip Hiking  - 2 x daily - 7 x weekly - 2 sets - 10 reps - 3 seconds hold - Prone Hip Extension  - 2 x daily - 7 x weekly - 2 sets - 10 reps - 5 seconds hold - Standing Scapular Retraction  - 5 x daily - 7 x weekly - 1 sets - 5 reps - 5 second hold - Yoga Bridge  - 2 x daily - 7 x weekly - 2 sets - 10 reps - 5 seconds hold - Supine Figure 4 Piriformis  Stretch  - 2 x daily - 7 x weekly - 1 sets - 5 reps - 20 seconds hold  ASSESSMENT:  CLINICAL IMPRESSION: Maddalynn is comfortable with her home exercises and has improved postural awareness.  She is more consistent with body mechanics and does not appear to need any additional correction with her home exercises.  We agreed to a trial of independent independent rehabilitation because she is comfortable with her current program which appears to be addressing her major impairments.  She may contact Dr. Magnus Ivan in 3+ months if she is not happy with her progress with her independent rehabilitation.   OBJECTIVE IMPAIRMENTS: Abnormal gait, decreased activity tolerance, decreased endurance, decreased knowledge of condition, difficulty walking, decreased ROM, decreased strength, decreased safety awareness, impaired perceived functional ability, impaired flexibility, improper body mechanics, postural dysfunction, and pain.   ACTIVITY LIMITATIONS: carrying, lifting, bending, sitting, standing, squatting, stairs, bed mobility, and locomotion level  PARTICIPATION LIMITATIONS: meal prep, cleaning, laundry, shopping, and community activity  PERSONAL FACTORS: OA, hypothyroid, Lt TKA 10/2021 are also affecting patient's functional outcome.   REHAB POTENTIAL: Good  CLINICAL DECISION MAKING: Stable/uncomplicated  EVALUATION COMPLEXITY: Low   GOALS: Goals reviewed with patient? Yes  SHORT TERM GOALS: Target date: 04/24/2022  Meryn will be able to demonstrate correct sitting and standing posture along with correct use of a lumbar roll and correct  body mechanics for getting in and out of bed Baseline: All areas above need to be addressed Goal status: Met 04/30/2022  2.  Annalicia will be independent with her day 1 home exercise program Baseline: Started 03/27/2022 Goal status: Met 04/23/2022   LONG TERM GOALS: Target date: 05/22/2022  Improve FOTO to 54 in 12 visits her left Baseline: 40 Goal status: Met  05/21/2022  2.  Jalynne will report low back pain consistently 0-3 out of 10 on the visual analog scale and will demonstrate the ability to manage symptoms when they increase with postural and exercise correction Baseline: As high as 6 out of 10 Goal status: Met 05/21/2022  3.  Improve lumbar extension AROM to 10 degrees Baseline: 5 degrees Goal status: Met 05/21/2022  4.  Improve low back and postural strength as assessed by FOTO and self-reported function Baseline: See FOTO Goal status: On going 4/10/2024Met 05/21/2022  5.  Dante will be able to stand and walk for longer periods of time without being stopped by low back pain Baseline: Limited at evaluation 03/27/2022 Goal status: Met 05/21/2022  6.  Seona will be independent with her long-term home exercise program at discharge Baseline: Started 03/27/2022 Goal status: Met 05/21/2022  PLAN:  PT FREQUENCY: DC  PT DURATION: DC  PLANNED INTERVENTIONS: Therapeutic exercises, Therapeutic activity, Neuromuscular re-education, Balance training, Gait training, Patient/Family education, Self Care, Joint mobilization, Stair training, Cryotherapy, Moist heat, and Manual therapy.  PLAN FOR NEXT SESSION: DC  Cherlyn Cushing PT, MPT 05/21/22  1:57 PM

## 2022-05-28 ENCOUNTER — Encounter: Payer: Medicare Other | Admitting: Rehabilitative and Restorative Service Providers"

## 2022-06-04 ENCOUNTER — Encounter: Payer: Medicare Other | Admitting: Rehabilitative and Restorative Service Providers"

## 2022-06-16 ENCOUNTER — Ambulatory Visit (INDEPENDENT_AMBULATORY_CARE_PROVIDER_SITE_OTHER): Payer: Medicare Other | Admitting: Orthopaedic Surgery

## 2022-06-16 ENCOUNTER — Other Ambulatory Visit: Payer: Self-pay

## 2022-06-16 DIAGNOSIS — M545 Low back pain, unspecified: Secondary | ICD-10-CM

## 2022-06-16 DIAGNOSIS — G8929 Other chronic pain: Secondary | ICD-10-CM

## 2022-06-16 NOTE — Progress Notes (Signed)
The patient is a very pleasant 89 year old who comes in today to go over MRI reports of her lumbar spine.  She has only low back pain across her lower lumbar spine with no radicular component.  She is a thin 89 year old who is very active.  It hurts more toward the end of the day when she has been standing up quite a bit.  We have replaced her left knee which is done great for her.  The MRI of her lumbar spine does show multilevel spondylosis.  There is facet hypertrophy at L4-L5 and L5-S1.  This seems to correlate where she is hurting the most.  I showed him a spine model and I recommended that we set her up for an appointment with Dr. Alvester Morin for bilateral L5-S1 facet joint injections versus L4-L5 depending on what he sees from a clinical standpoint and where he feels she may best benefit from these types of injections based on MRI findings.  She is in favor of seeing Dr. Alvester Morin for injection such as this.  Again she has been to physical therapy as well.  Her daughter will also get some Voltaren gel to rub on her back.

## 2022-06-24 ENCOUNTER — Telehealth: Payer: Self-pay | Admitting: Physical Medicine and Rehabilitation

## 2022-06-24 NOTE — Telephone Encounter (Signed)
Patient returned call asked for a call back Patient said she has an appointment to get a tooth replaced. Patient asked will the tooth replacement interfere with the dental appointment. The number to contact patient is [phone]

## 2022-06-26 ENCOUNTER — Telehealth: Payer: Self-pay | Admitting: Physical Medicine and Rehabilitation

## 2022-06-26 NOTE — Telephone Encounter (Signed)
Spoke with patient and rescheduled injection for 07/09/22. Patient aware driver needed  

## 2022-06-26 NOTE — Telephone Encounter (Signed)
Spoke with patient and rescheduled injection for 07/09/22. Patient aware driver needed

## 2022-06-26 NOTE — Telephone Encounter (Signed)
Patient called needing to reschedule her appointment with Dr. Alvester Morin. The number to contact patient is [phone]

## 2022-07-02 ENCOUNTER — Encounter: Payer: Medicare Other | Admitting: Physical Medicine and Rehabilitation

## 2022-07-09 ENCOUNTER — Other Ambulatory Visit: Payer: Self-pay

## 2022-07-09 ENCOUNTER — Ambulatory Visit (INDEPENDENT_AMBULATORY_CARE_PROVIDER_SITE_OTHER): Payer: Medicare Other | Admitting: Physical Medicine and Rehabilitation

## 2022-07-09 DIAGNOSIS — M47816 Spondylosis without myelopathy or radiculopathy, lumbar region: Secondary | ICD-10-CM | POA: Diagnosis not present

## 2022-07-09 MED ORDER — METHYLPREDNISOLONE ACETATE 80 MG/ML IJ SUSP
80.0000 mg | Freq: Once | INTRAMUSCULAR | Status: AC
  Administered 2022-07-09: 80 mg

## 2022-07-09 NOTE — Progress Notes (Signed)
Functional Pain Scale - descriptive words and definitions  Distracting (5)    Aware of pain/able to complete some ADL's but limited by pain/sleep is affected and active distractions are only slightly useful. Moderate range order  Average Pain 6   +Driver, -BT, -Dye Allergies.  Lower back pain on left side that radiates into left leg 

## 2022-07-09 NOTE — Patient Instructions (Signed)

## 2022-07-11 NOTE — Procedures (Signed)
Lumbar Facet Joint Intra-Articular Injection(s) with Fluoroscopic Guidance  Patient: Toni Medina      Date of Birth: 1931/11/28 MRN: [id number] PCP: Renford Dills, MD      Visit Date: 07/09/2022   Universal Protocol:    Date/Time: 07/09/2022  Consent Given By: the patient  Position: PRONE   Additional Comments: Vital signs were monitored before and after the procedure. Patient was prepped and draped in the usual sterile fashion. The correct patient, procedure, and site was verified.   Injection Procedure Details:  Procedure Site One Meds Administered:  Meds ordered this encounter  Medications   methylPREDNISolone acetate (DEPO-MEDROL) injection 80 mg     Laterality: Bilateral  Location/Site:  L4-L5 L5-S1  Needle size: 22 guage  Needle type: Spinal  Needle Placement: Articular  Findings:  -Comments: Excellent flow of contrast producing a partial arthrogram.  Procedure Details: The fluoroscope beam is vertically oriented in AP, and the inferior recess is visualized beneath the lower pole of the inferior apophyseal process, which represents the target point for needle insertion. When direct visualization is difficult the target point is located at the medial projection of the vertebral pedicle. The region overlying each aforementioned target is locally anesthetized with a 1 to 2 ml. volume of 1% Lidocaine without Epinephrine.   The spinal needle was inserted into each of the above mentioned facet joints using biplanar fluoroscopic guidance. A 0.25 to 0.5 ml. volume of Isovue-250 was injected and a partial facet joint arthrogram was obtained. A single spot film was obtained of the resulting arthrogram.    One to 1.25 ml of the steroid/anesthetic solution was then injected into each of the facet joints noted above.   Additional Comments:  The patient tolerated the procedure well Dressing: 2 x 2 sterile gauze and Band-Aid    Post-procedure details: Patient was  observed during the procedure. Post-procedure instructions were reviewed.  Patient left the clinic in stable condition.

## 2022-07-11 NOTE — Progress Notes (Signed)
Toni Medina - 89 y.o. female MRN [id number]  Date of birth: [birth year]  Office Visit Note: Visit Date: 07/09/2022 PCP: Renford Dills, MD Referred by: Renford Dills, MD  Subjective: Chief Complaint  Patient presents with   Lower Back - Pain   HPI:  Toni Medina is a 89 y.o. female who comes in today at the request of Dr. Doneen Poisson for planned Bilateral  L4-5 and L5-S1 Lumbar facet/medial branch block with fluoroscopic guidance.  The patient has failed conservative care including home exercise, medications, time and activity modification.  This injection will be diagnostic and hopefully therapeutic.  Please see requesting physician notes for further details and justification.  Exam has shown concordant pain with facet joint loading.    ROS Otherwise per HPI.  Assessment & Plan: Visit Diagnoses:    ICD-10-CM   1. Spondylosis without myelopathy or radiculopathy, lumbar region  M47.816 XR C-ARM NO REPORT    Facet Injection    methylPREDNISolone acetate (DEPO-MEDROL) injection 80 mg      Plan: No additional findings.   Meds & Orders:  Meds ordered this encounter  Medications   methylPREDNISolone acetate (DEPO-MEDROL) injection 80 mg    Orders Placed This Encounter  Procedures   Facet Injection   XR C-ARM NO REPORT    Follow-up: Return for visit to requesting provider as needed.   Procedures: No procedures performed  Lumbar Facet Joint Intra-Articular Injection(s) with Fluoroscopic Guidance  Patient: Toni Medina      Date of Birth: Jan 24, 1932 MRN: [id number] PCP: Renford Dills, MD      Visit Date: 07/09/2022   Universal Protocol:    Date/Time: 07/09/2022  Consent Given By: the patient  Position: PRONE   Additional Comments: Vital signs were monitored before and after the procedure. Patient was prepped and draped in the usual sterile fashion. The correct patient, procedure, and site was verified.   Injection Procedure Details:   Procedure Site One Meds Administered:  Meds ordered this encounter  Medications   methylPREDNISolone acetate (DEPO-MEDROL) injection 80 mg     Laterality: Bilateral  Location/Site:  L4-L5 L5-S1  Needle size: 22 guage  Needle type: Spinal  Needle Placement: Articular  Findings:  -Comments: Excellent flow of contrast producing a partial arthrogram.  Procedure Details: The fluoroscope beam is vertically oriented in AP, and the inferior recess is visualized beneath the lower pole of the inferior apophyseal process, which represents the target point for needle insertion. When direct visualization is difficult the target point is located at the medial projection of the vertebral pedicle. The region overlying each aforementioned target is locally anesthetized with a 1 to 2 ml. volume of 1% Lidocaine without Epinephrine.   The spinal needle was inserted into each of the above mentioned facet joints using biplanar fluoroscopic guidance. A 0.25 to 0.5 ml. volume of Isovue-250 was injected and a partial facet joint arthrogram was obtained. A single spot film was obtained of the resulting arthrogram.    One to 1.25 ml of the steroid/anesthetic solution was then injected into each of the facet joints noted above.   Additional Comments:  The patient tolerated the procedure well Dressing: 2 x 2 sterile gauze and Band-Aid    Post-procedure details: Patient was observed during the procedure. Post-procedure instructions were reviewed.  Patient left the clinic in stable condition.     Clinical History: MRI LUMBAR SPINE WITHOUT CONTRAST   TECHNIQUE: Multiplanar, multisequence MR imaging of the lumbar spine was performed. No intravenous contrast  was administered.   COMPARISON:  Radiographs 04/24/2022. MRI 11/22/2015 (without report).   FINDINGS: Segmentation: Conventional anatomy assumed, with the last open disc space designated L5-S1.Concordant with prior imaging.   Alignment:  Straightening of the usual lumbar lordosis without focal angulation or significant anterolisthesis or retrolisthesis. On the coronal images, there is leftward listhesis of L3 relative to L4 and a mild convex right scoliosis centered at L4-5.   Vertebrae: No worrisome osseous lesion, acute fracture or pars defect. The visualized sacroiliac joints appear unremarkable.   Conus medullaris: Extends to the L1 level and appears normal.   Paraspinal and other soft tissues: No significant paraspinal findings.   Disc levels:   Sagittal images demonstrate no significant disc space findings within the visualized lower thoracic spine.   L1-2: Preserved disc height with minimal disc bulging. No spinal stenosis or nerve root encroachment.   L2-3: Chronic loss of disc height with annular disc bulging and endplate osteophytes. Mild bilateral facet hypertrophy. No significant spinal stenosis or nerve root encroachment.   L3-4: Chronic advanced loss of disc height with annular disc bulging and endplate osteophytes asymmetric to the left. As above, leftward listhesis of L3 relative to L4. There is moderate facet and ligamentous hypertrophy with mild spinal stenosis and mild lateral recess and foraminal narrowing bilaterally. Overall appearance is similar to the previous MRI.   L4-5: Chronic advanced loss of disc height with annular disc bulging and endplate osteophytes asymmetric to the left. Moderate facet and ligamentous hypertrophy. No significant central spinal stenosis. Stable mild left lateral recess and left foraminal narrowing.   L5-S1: Chronic mild loss of disc height with annular disc bulging and endplate osteophytes asymmetric to the left. Moderate bilateral facet hypertrophy, also worse on the left. Stable resulting mild spinal stenosis with asymmetric left lateral recess narrowing, mild right foraminal and moderate left foraminal narrowing.   IMPRESSION: 1. No acute findings or  clear explanation for the patient's symptoms. Findings are similar to remote MRI from 11/22/2015. 2. Chronic multilevel spondylosis with disc bulging, endplate osteophytes and facet hypertrophy as described. There is resulting mild spinal stenosis at L3-4 and L5-S1. 3. Chronic mild lateral recess and foraminal narrowing as detailed above. Foraminal narrowing is greatest on the left at L5-S1.     Electronically Signed   By: Carey Bullocks M.D.   On: 05/23/2022 16:57     Objective:  VS:  HT:    WT:   BMI:     BP:   HR: bpm  TEMP: ( )  RESP:  Physical Exam Vitals and nursing note reviewed.  Constitutional:      General: She is not in acute distress.    Appearance: Normal appearance. She is not ill-appearing.  HENT:     Head: Normocephalic and atraumatic.     Right Ear: External ear normal.     Left Ear: External ear normal.  Eyes:     Extraocular Movements: Extraocular movements intact.  Cardiovascular:     Rate and Rhythm: Normal rate.     Pulses: Normal pulses.  Pulmonary:     Effort: Pulmonary effort is normal. No respiratory distress.  Abdominal:     General: There is no distension.     Palpations: Abdomen is soft.  Musculoskeletal:        General: Tenderness present.     Cervical back: Neck supple.     Right lower leg: No edema.     Left lower leg: No edema.     Comments: Patient  has good distal strength with no pain over the greater trochanters.  No clonus or focal weakness.  Skin:    Findings: No erythema, lesion or rash.  Neurological:     General: No focal deficit present.     Mental Status: She is alert and oriented to person, place, and time.     Sensory: No sensory deficit.     Motor: No weakness or abnormal muscle tone.     Coordination: Coordination normal.  Psychiatric:        Mood and Affect: Mood normal.        Behavior: Behavior normal.      Imaging: No results found.

## 2022-07-22 ENCOUNTER — Other Ambulatory Visit: Payer: Self-pay | Admitting: Physical Medicine and Rehabilitation

## 2022-07-22 ENCOUNTER — Telehealth: Payer: Self-pay | Admitting: Physical Medicine and Rehabilitation

## 2022-07-22 DIAGNOSIS — M5416 Radiculopathy, lumbar region: Secondary | ICD-10-CM

## 2022-07-22 NOTE — Progress Notes (Signed)
Spoke with patient this afternoon, minimal relief of pain with bilateral L4-L5 and L5-S1 facet joint injections. There is lateral recess narrowing at L3-L4, would like to try interlaminar epidural steroid injection at the level of L4-L5. She is not taking anticoagulants. I will go ahead and place order.

## 2022-07-22 NOTE — Telephone Encounter (Signed)
Patient called advised she is not feeling any better but, not feeling any worse. Patient said she would like to speak with Dr. Alvester Morin direct.   The number to contact patient is [phone]

## 2022-07-23 DIAGNOSIS — T189XXA Foreign body of alimentary tract, part unspecified, initial encounter: Secondary | ICD-10-CM | POA: Diagnosis not present

## 2022-08-12 ENCOUNTER — Telehealth: Payer: Self-pay | Admitting: Physical Medicine and Rehabilitation

## 2022-08-12 NOTE — Telephone Encounter (Signed)
Spoke with patient and she is going to keep the appointment on 08/14/22. If it is storming, she will call in to cancel

## 2022-08-12 NOTE — Telephone Encounter (Signed)
Pt called stating she is unable to come to her appt on 7/11. States a strom is coming and she can not drive in [place]. She would like to reschedule appt. Pt phone number is [phone].

## 2022-08-12 NOTE — Telephone Encounter (Signed)
LVM to return call to reschedule injection 

## 2022-08-12 NOTE — Telephone Encounter (Signed)
Crystal is calling patient to inform her that Dr. Alvester Morin does not do injections on Fridays

## 2022-08-12 NOTE — Telephone Encounter (Signed)
Patient called needing to R/S her appointment due to the weather on Thursday.  The number to contact patient is [phone]

## 2022-08-12 NOTE — Telephone Encounter (Signed)
Pt called Toni Medina back to see if Dr Alvester Morin has any openings on Friday 7/12. Please call pt at [phone].

## 2022-08-14 ENCOUNTER — Other Ambulatory Visit: Payer: Self-pay

## 2022-08-14 ENCOUNTER — Ambulatory Visit (INDEPENDENT_AMBULATORY_CARE_PROVIDER_SITE_OTHER): Payer: Medicare Other | Admitting: Physical Medicine and Rehabilitation

## 2022-08-14 VITALS — BP 150/79 | HR 71

## 2022-08-14 DIAGNOSIS — M5416 Radiculopathy, lumbar region: Secondary | ICD-10-CM | POA: Diagnosis not present

## 2022-08-14 MED ORDER — METHYLPREDNISOLONE ACETATE 80 MG/ML IJ SUSP
80.0000 mg | Freq: Once | INTRAMUSCULAR | Status: AC
  Administered 2022-08-14: 80 mg

## 2022-08-14 NOTE — Patient Instructions (Signed)

## 2022-08-14 NOTE — Progress Notes (Signed)
Functional Pain Scale - descriptive words and definitions  Uncomfortable (3)  Pain is present but can complete all ADL's/sleep is slightly affected and passive distraction only gives marginal relief. Mild range order  Average Pain 3   +Driver, -BT, -Dye Allergies.  Lower back pain in the middle

## 2022-09-03 NOTE — Progress Notes (Signed)
Toni Medina - 89 y.o. female MRN [id number]  Date of birth: 1931/10/26  Office Visit Note: Visit Date: 08/14/2022 PCP: Renford Dills, MD Referred by: Renford Dills, MD  Subjective: Chief Complaint  Patient presents with   Lower Back - Pain   HPI:  Toni Medina is a 89 y.o. female who comes in today at the request of Dr. Doneen Poisson for planned Midline L4-5 Lumbar Interlaminar epidural steroid injection with fluoroscopic guidance.  The patient has failed conservative care including home exercise, medications, time and activity modification.  This injection will be diagnostic and hopefully therapeutic.  Please see requesting physician notes for further details and justification.   ROS Otherwise per HPI.  Assessment & Plan: Visit Diagnoses:    ICD-10-CM   1. Lumbar radiculopathy  M54.16 XR C-ARM NO REPORT    Epidural Steroid injection    methylPREDNISolone acetate (DEPO-MEDROL) injection 80 mg      Plan: No additional findings.   Meds & Orders:  Meds ordered this encounter  Medications   methylPREDNISolone acetate (DEPO-MEDROL) injection 80 mg    Orders Placed This Encounter  Procedures   XR C-ARM NO REPORT   Epidural Steroid injection    Follow-up: Return for visit to requesting provider as needed.   Procedures: No procedures performed  Lumbar Epidural Steroid Injection - Interlaminar Approach with Fluoroscopic Guidance  Patient: Toni Medina      Date of Birth: [birth year] MRN: [id number] PCP: Renford Dills, MD      Visit Date: 08/14/2022   Universal Protocol:     Consent Given By: the patient  Position: PRONE  Additional Comments: Vital signs were monitored before and after the procedure. Patient was prepped and draped in the usual sterile fashion. The correct patient, procedure, and site was verified.   Injection Procedure Details:   Procedure diagnoses: Lumbar radiculopathy [M54.16]   Meds Administered:  Meds ordered  this encounter  Medications   methylPREDNISolone acetate (DEPO-MEDROL) injection 80 mg     Laterality: Midline  Location/Site:  L4-5  Needle: 3.5 in., 20 ga. Tuohy  Needle Placement: Paramedian epidural  Findings:   -Comments: Excellent flow of contrast into the epidural space.  Procedure Details: Using a paramedian approach from the side mentioned above, the region overlying the inferior lamina was localized under fluoroscopic visualization and the soft tissues overlying this structure were infiltrated with 4 ml. of 1% Lidocaine without Epinephrine. The Tuohy needle was inserted into the epidural space using a paramedian approach.   The epidural space was localized using loss of resistance along with counter oblique bi-planar fluoroscopic views.  After negative aspirate for air, blood, and CSF, a 2 ml. volume of Isovue-250 was injected into the epidural space and the flow of contrast was observed. Radiographs were obtained for documentation purposes.    The injectate was administered into the level noted above.   Additional Comments:  No complications occurred Dressing: 2 x 2 sterile gauze and Band-Aid    Post-procedure details: Patient was observed during the procedure. Post-procedure instructions were reviewed.  Patient left the clinic in stable condition.   Clinical History: MRI LUMBAR SPINE WITHOUT CONTRAST   TECHNIQUE: Multiplanar, multisequence MR imaging of the lumbar spine was performed. No intravenous contrast was administered.   COMPARISON:  Radiographs 04/24/2022. MRI 11/22/2015 (without report).   FINDINGS: Segmentation: Conventional anatomy assumed, with the last open disc space designated L5-S1.Concordant with prior imaging.   Alignment: Straightening of the usual lumbar lordosis without focal angulation or  significant anterolisthesis or retrolisthesis. On the coronal images, there is leftward listhesis of L3 relative to L4 and a mild convex right  scoliosis centered at L4-5.   Vertebrae: No worrisome osseous lesion, acute fracture or pars defect. The visualized sacroiliac joints appear unremarkable.   Conus medullaris: Extends to the L1 level and appears normal.   Paraspinal and other soft tissues: No significant paraspinal findings.   Disc levels:   Sagittal images demonstrate no significant disc space findings within the visualized lower thoracic spine.   L1-2: Preserved disc height with minimal disc bulging. No spinal stenosis or nerve root encroachment.   L2-3: Chronic loss of disc height with annular disc bulging and endplate osteophytes. Mild bilateral facet hypertrophy. No significant spinal stenosis or nerve root encroachment.   L3-4: Chronic advanced loss of disc height with annular disc bulging and endplate osteophytes asymmetric to the left. As above, leftward listhesis of L3 relative to L4. There is moderate facet and ligamentous hypertrophy with mild spinal stenosis and mild lateral recess and foraminal narrowing bilaterally. Overall appearance is similar to the previous MRI.   L4-5: Chronic advanced loss of disc height with annular disc bulging and endplate osteophytes asymmetric to the left. Moderate facet and ligamentous hypertrophy. No significant central spinal stenosis. Stable mild left lateral recess and left foraminal narrowing.   L5-S1: Chronic mild loss of disc height with annular disc bulging and endplate osteophytes asymmetric to the left. Moderate bilateral facet hypertrophy, also worse on the left. Stable resulting mild spinal stenosis with asymmetric left lateral recess narrowing, mild right foraminal and moderate left foraminal narrowing.   IMPRESSION: 1. No acute findings or clear explanation for the patient's symptoms. Findings are similar to remote MRI from 11/22/2015. 2. Chronic multilevel spondylosis with disc bulging, endplate osteophytes and facet hypertrophy as described. There  is resulting mild spinal stenosis at L3-4 and L5-S1. 3. Chronic mild lateral recess and foraminal narrowing as detailed above. Foraminal narrowing is greatest on the left at L5-S1.     Electronically Signed   By: Carey Bullocks M.D.   On: 05/23/2022 16:57     Objective:  VS:  HT:    WT:   BMI:     BP:(!) 150/79  HR:71bpm  TEMP: ( )  RESP:  Physical Exam Vitals and nursing note reviewed.  Constitutional:      General: She is not in acute distress.    Appearance: Normal appearance. She is not ill-appearing.  HENT:     Head: Normocephalic and atraumatic.     Right Ear: External ear normal.     Left Ear: External ear normal.  Eyes:     Extraocular Movements: Extraocular movements intact.  Cardiovascular:     Rate and Rhythm: Normal rate.     Pulses: Normal pulses.  Pulmonary:     Effort: Pulmonary effort is normal. No respiratory distress.  Abdominal:     General: There is no distension.     Palpations: Abdomen is soft.  Musculoskeletal:        General: Tenderness present.     Cervical back: Neck supple.     Right lower leg: No edema.     Left lower leg: No edema.     Comments: Patient has good distal strength with no pain over the greater trochanters.  No clonus or focal weakness.  Skin:    Findings: No erythema, lesion or rash.  Neurological:     General: No focal deficit present.     Mental  Status: She is alert and oriented to person, place, and time.     Sensory: No sensory deficit.     Motor: No weakness or abnormal muscle tone.     Coordination: Coordination normal.  Psychiatric:        Mood and Affect: Mood normal.        Behavior: Behavior normal.      Imaging: No results found.

## 2022-09-03 NOTE — Procedures (Signed)
Lumbar Epidural Steroid Injection - Interlaminar Approach with Fluoroscopic Guidance  Patient: Toni Medina      Date of Birth: Feb 28, 1931 MRN: [id number] PCP: Renford Dills, MD      Visit Date: 08/14/2022   Universal Protocol:     Consent Given By: the patient  Position: PRONE  Additional Comments: Vital signs were monitored before and after the procedure. Patient was prepped and draped in the usual sterile fashion. The correct patient, procedure, and site was verified.   Injection Procedure Details:   Procedure diagnoses: Lumbar radiculopathy [M54.16]   Meds Administered:  Meds ordered this encounter  Medications   methylPREDNISolone acetate (DEPO-MEDROL) injection 80 mg     Laterality: Midline  Location/Site:  L4-5  Needle: 3.5 in., 20 ga. Tuohy  Needle Placement: Paramedian epidural  Findings:   -Comments: Excellent flow of contrast into the epidural space.  Procedure Details: Using a paramedian approach from the side mentioned above, the region overlying the inferior lamina was localized under fluoroscopic visualization and the soft tissues overlying this structure were infiltrated with 4 ml. of 1% Lidocaine without Epinephrine. The Tuohy needle was inserted into the epidural space using a paramedian approach.   The epidural space was localized using loss of resistance along with counter oblique bi-planar fluoroscopic views.  After negative aspirate for air, blood, and CSF, a 2 ml. volume of Isovue-250 was injected into the epidural space and the flow of contrast was observed. Radiographs were obtained for documentation purposes.    The injectate was administered into the level noted above.   Additional Comments:  No complications occurred Dressing: 2 x 2 sterile gauze and Band-Aid    Post-procedure details: Patient was observed during the procedure. Post-procedure instructions were reviewed.  Patient left the clinic in stable condition.

## 2022-09-08 ENCOUNTER — Ambulatory Visit
Admission: RE | Admit: 2022-09-08 | Discharge: 2022-09-08 | Disposition: A | Discharge status: Home / Self Care | Payer: Medicare Other | Source: Ambulatory Visit | Attending: Internal Medicine | Admitting: Internal Medicine

## 2022-09-08 DIAGNOSIS — Z1231 Encounter for screening mammogram for malignant neoplasm of breast: Secondary | ICD-10-CM

## 2022-10-25 DIAGNOSIS — Z23 Encounter for immunization: Secondary | ICD-10-CM | POA: Diagnosis not present

## 2022-12-22 DIAGNOSIS — E039 Hypothyroidism, unspecified: Secondary | ICD-10-CM | POA: Diagnosis not present

## 2022-12-22 DIAGNOSIS — E7800 Pure hypercholesterolemia, unspecified: Secondary | ICD-10-CM | POA: Diagnosis not present

## 2022-12-22 DIAGNOSIS — M8580 Other specified disorders of bone density and structure, unspecified site: Secondary | ICD-10-CM | POA: Diagnosis not present

## 2022-12-22 DIAGNOSIS — Z5181 Encounter for therapeutic drug level monitoring: Secondary | ICD-10-CM | POA: Diagnosis not present

## 2022-12-23 ENCOUNTER — Other Ambulatory Visit: Payer: Self-pay | Admitting: Internal Medicine

## 2022-12-23 DIAGNOSIS — M8580 Other specified disorders of bone density and structure, unspecified site: Secondary | ICD-10-CM

## 2023-01-13 DIAGNOSIS — H6123 Impacted cerumen, bilateral: Secondary | ICD-10-CM | POA: Diagnosis not present

## 2023-01-20 DIAGNOSIS — Z961 Presence of intraocular lens: Secondary | ICD-10-CM | POA: Diagnosis not present

## 2023-01-20 DIAGNOSIS — H10413 Chronic giant papillary conjunctivitis, bilateral: Secondary | ICD-10-CM | POA: Diagnosis not present

## 2023-01-20 DIAGNOSIS — H5212 Myopia, left eye: Secondary | ICD-10-CM | POA: Diagnosis not present

## 2023-08-31 ENCOUNTER — Other Ambulatory Visit: Payer: Self-pay | Admitting: Internal Medicine

## 2023-08-31 DIAGNOSIS — Z1231 Encounter for screening mammogram for malignant neoplasm of breast: Secondary | ICD-10-CM

## 2023-09-18 ENCOUNTER — Encounter: Payer: Self-pay | Admitting: Podiatry

## 2023-09-18 ENCOUNTER — Ambulatory Visit
Admission: RE | Admit: 2023-09-18 | Discharge: 2023-09-18 | Disposition: A | Discharge status: Home / Self Care | Source: Ambulatory Visit | Attending: Internal Medicine | Admitting: Internal Medicine

## 2023-09-18 ENCOUNTER — Ambulatory Visit (INDEPENDENT_AMBULATORY_CARE_PROVIDER_SITE_OTHER): Admitting: Podiatry

## 2023-09-18 DIAGNOSIS — B351 Tinea unguium: Secondary | ICD-10-CM | POA: Diagnosis not present

## 2023-09-18 DIAGNOSIS — M7751 Other enthesopathy of right foot: Secondary | ICD-10-CM

## 2023-09-18 DIAGNOSIS — M79672 Pain in left foot: Secondary | ICD-10-CM | POA: Diagnosis not present

## 2023-09-18 DIAGNOSIS — M7752 Other enthesopathy of left foot: Secondary | ICD-10-CM

## 2023-09-18 DIAGNOSIS — M79671 Pain in right foot: Secondary | ICD-10-CM

## 2023-09-18 DIAGNOSIS — Z1231 Encounter for screening mammogram for malignant neoplasm of breast: Secondary | ICD-10-CM

## 2023-09-18 NOTE — Progress Notes (Signed)
 Patient presents for evaluation and treatment of tenderness and some redness around nails feet.  Tenderness around toes with walking and wearing shoes.  Physical exam:  General appearance: Alert, pleasant, and in no acute distress.  Vascular: Pedal pulses: DP 2/4 B/L, PT 1/4 B/L.  Mild edema lower legs bilaterally.  Capillary refill time immediate bilaterally  Neurological:  Light touch intact.  Normal Achilles tendon reflex.  No clonus or spasticity noted.  Dermatologic:  Nails thickened, disfigured, discolored 1-5 BL with subungual debris.  Redness and hypertrophic nail folds along nail folds bilaterally but no signs of drainage or infection.  Musculoskeletal:  Some tenderness on the dorsal medial aspect of the first MTP with hallux abductovalgus Forni bilaterally.  No tenderness with range of motion of the first MTP bilaterally.  Normal muscle strength lower extremity bilaterally.   Diagnosis: 1. Painful onychomycotic nails 1 through 5 bilaterally. 2. Pain toes 1 through 5 bilaterally. 3.  Capsulitis first MTP secondary to hallux abductovalgus deformities bilaterally.  Plan: -New patient office visit for evaluation and treatment.  Level 3 .  Modifier 25. -Discussed onychomycotic nails and periodic debridement.  Discussed what things look out for as far as diabetes with the feet.  What to look for for signs of infection or breaks in the skin.  Discussed about bunion deformities with her and the resulting capsulitis.  Generally if she wears the right shoes such as a new [place] she has on today she does have problem with it.  It becomes more. Debrided onychomycotic nails 1 through 5 bilaterally.  Return 3 months [facility]

## 2023-12-25 ENCOUNTER — Ambulatory Visit: Admitting: Podiatry

## 2023-12-29 ENCOUNTER — Encounter: Payer: Self-pay | Admitting: Podiatry

## 2023-12-29 ENCOUNTER — Ambulatory Visit (INDEPENDENT_AMBULATORY_CARE_PROVIDER_SITE_OTHER): Admitting: Podiatry

## 2023-12-29 DIAGNOSIS — R413 Other amnesia: Secondary | ICD-10-CM | POA: Diagnosis not present

## 2023-12-29 DIAGNOSIS — Z5181 Encounter for therapeutic drug level monitoring: Secondary | ICD-10-CM | POA: Diagnosis not present

## 2023-12-29 DIAGNOSIS — M79671 Pain in right foot: Secondary | ICD-10-CM | POA: Diagnosis not present

## 2023-12-29 DIAGNOSIS — E039 Hypothyroidism, unspecified: Secondary | ICD-10-CM | POA: Diagnosis not present

## 2023-12-29 DIAGNOSIS — Z1331 Encounter for screening for depression: Secondary | ICD-10-CM | POA: Diagnosis not present

## 2023-12-29 DIAGNOSIS — M79672 Pain in left foot: Secondary | ICD-10-CM

## 2023-12-29 DIAGNOSIS — B351 Tinea unguium: Secondary | ICD-10-CM | POA: Diagnosis not present

## 2023-12-29 DIAGNOSIS — E7800 Pure hypercholesterolemia, unspecified: Secondary | ICD-10-CM | POA: Diagnosis not present

## 2023-12-29 DIAGNOSIS — M8580 Other specified disorders of bone density and structure, unspecified site: Secondary | ICD-10-CM | POA: Diagnosis not present

## 2023-12-29 DIAGNOSIS — Z23 Encounter for immunization: Secondary | ICD-10-CM | POA: Diagnosis not present

## 2023-12-29 DIAGNOSIS — Z0000 Encounter for general adult medical examination without abnormal findings: Secondary | ICD-10-CM | POA: Diagnosis not present

## 2023-12-29 NOTE — Progress Notes (Signed)
 Patient presents for evaluation and treatment of tenderness and some redness around nails feet.  Tenderness around toes with walking and wearing shoes.  Physical exam:  General appearance: Alert, pleasant, and in no acute distress.  Vascular: Pedal pulses: DP 2/4 B/L, PT 1/4 B/L. Mild edema lower legs bilaterally.  Capillary refill time immediate bilaterally  Neurologic:  Dermatologic:  Nails thickened, disfigured, discolored 1-5 BL with subungual debris.  Redness and hypertrophic nail folds along nail folds bilaterally but no signs of drainage or infection.  Musculoskeletal:     Diagnosis: 1. Painful onychomycotic nails 1 through 5 bilaterally. 2. Pain toes 1 through 5 bilaterally.  Plan: -Debrided onychomycotic nails 1 through 5 bilaterally.  Sharply debrided nails with nail clipper and reduced with a power bur.  Return 3 months [facility]

## 2024-01-26 ENCOUNTER — Encounter: Payer: Self-pay | Admitting: Physician Assistant

## 2024-03-15 ENCOUNTER — Ambulatory Visit: Admitting: Physician Assistant

## 2024-03-15 ENCOUNTER — Ambulatory Visit

## 2024-03-28 ENCOUNTER — Ambulatory Visit: Payer: Self-pay

## 2024-03-28 ENCOUNTER — Ambulatory Visit: Payer: Self-pay | Admitting: Physician Assistant

## 2024-04-01 ENCOUNTER — Ambulatory Visit: Admitting: Podiatry
